# Patient Record
Sex: Female | Born: 1960
Health system: Southern US, Community
[De-identification: ages and names within clinical notes are randomized; demographics above are authoritative.]

## PROBLEM LIST (undated history)

## (undated) DIAGNOSIS — F32A Depression, unspecified: Secondary | ICD-10-CM

## (undated) DIAGNOSIS — M8580 Other specified disorders of bone density and structure, unspecified site: Secondary | ICD-10-CM

## (undated) DIAGNOSIS — F329 Major depressive disorder, single episode, unspecified: Secondary | ICD-10-CM

## (undated) DIAGNOSIS — T7840XA Allergy, unspecified, initial encounter: Secondary | ICD-10-CM

## (undated) DIAGNOSIS — H409 Unspecified glaucoma: Secondary | ICD-10-CM

## (undated) DIAGNOSIS — F419 Anxiety disorder, unspecified: Secondary | ICD-10-CM

## (undated) DIAGNOSIS — E785 Hyperlipidemia, unspecified: Secondary | ICD-10-CM

## (undated) DIAGNOSIS — D649 Anemia, unspecified: Secondary | ICD-10-CM

## (undated) HISTORY — DX: Allergy, unspecified, initial encounter: T78.40XA

## (undated) HISTORY — DX: Other specified disorders of bone density and structure, unspecified site: M85.80

## (undated) HISTORY — DX: Unspecified glaucoma: H40.9

## (undated) HISTORY — DX: Major depressive disorder, single episode, unspecified: F32.9

## (undated) HISTORY — DX: Anemia, unspecified: D64.9

## (undated) HISTORY — PX: INNER EAR SURGERY: SHX679

## (undated) HISTORY — DX: Gilbert syndrome: E80.4

## (undated) HISTORY — PX: WISDOM TOOTH EXTRACTION: SHX21

## (undated) HISTORY — DX: Depression, unspecified: F32.A

## (undated) HISTORY — DX: Anxiety disorder, unspecified: F41.9

## (undated) HISTORY — PX: EYE SURGERY: SHX253

## (undated) HISTORY — PX: CHOLECYSTECTOMY: SHX55

## (undated) HISTORY — DX: Hyperlipidemia, unspecified: E78.5

---

## 1998-12-30 ENCOUNTER — Other Ambulatory Visit: Admission: RE | Admit: 1998-12-30 | Discharge: 1998-12-30 | Payer: Self-pay | Admitting: Gynecology

## 1999-12-31 ENCOUNTER — Other Ambulatory Visit: Admission: RE | Admit: 1999-12-31 | Discharge: 1999-12-31 | Payer: Self-pay | Admitting: Gynecology

## 2000-12-28 ENCOUNTER — Other Ambulatory Visit: Admission: RE | Admit: 2000-12-28 | Discharge: 2000-12-28 | Payer: Self-pay | Admitting: Gynecology

## 2001-12-14 ENCOUNTER — Other Ambulatory Visit: Admission: RE | Admit: 2001-12-14 | Discharge: 2001-12-14 | Payer: Self-pay | Admitting: Gynecology

## 2002-12-18 ENCOUNTER — Other Ambulatory Visit: Admission: RE | Admit: 2002-12-18 | Discharge: 2002-12-18 | Payer: Self-pay | Admitting: Gynecology

## 2003-06-24 ENCOUNTER — Encounter: Admission: RE | Admit: 2003-06-24 | Discharge: 2003-06-24 | Payer: Self-pay | Admitting: Family Medicine

## 2003-06-24 ENCOUNTER — Encounter: Payer: Self-pay | Admitting: Family Medicine

## 2003-12-12 ENCOUNTER — Other Ambulatory Visit: Admission: RE | Admit: 2003-12-12 | Discharge: 2003-12-12 | Payer: Self-pay | Admitting: Gynecology

## 2004-01-15 ENCOUNTER — Emergency Department (HOSPITAL_COMMUNITY): Admission: EM | Admit: 2004-01-15 | Discharge: 2004-01-15 | Payer: Self-pay

## 2004-12-06 ENCOUNTER — Encounter: Admission: RE | Admit: 2004-12-06 | Discharge: 2004-12-06 | Payer: Self-pay | Admitting: Family Medicine

## 2004-12-31 ENCOUNTER — Other Ambulatory Visit: Admission: RE | Admit: 2004-12-31 | Discharge: 2004-12-31 | Payer: Self-pay | Admitting: Gynecology

## 2005-01-24 ENCOUNTER — Ambulatory Visit: Payer: Self-pay | Admitting: Internal Medicine

## 2005-02-11 ENCOUNTER — Ambulatory Visit (HOSPITAL_COMMUNITY): Admission: RE | Admit: 2005-02-11 | Discharge: 2005-02-11 | Payer: Self-pay | Admitting: Family Medicine

## 2006-01-10 ENCOUNTER — Other Ambulatory Visit: Admission: RE | Admit: 2006-01-10 | Discharge: 2006-01-10 | Payer: Self-pay | Admitting: Gynecology

## 2007-01-16 ENCOUNTER — Other Ambulatory Visit: Admission: RE | Admit: 2007-01-16 | Discharge: 2007-01-16 | Payer: Self-pay | Admitting: Gynecology

## 2007-03-20 ENCOUNTER — Encounter: Admission: RE | Admit: 2007-03-20 | Discharge: 2007-03-20 | Payer: Self-pay | Admitting: Otolaryngology

## 2008-01-22 ENCOUNTER — Other Ambulatory Visit: Admission: RE | Admit: 2008-01-22 | Discharge: 2008-01-22 | Payer: Self-pay | Admitting: Gynecology

## 2008-12-25 ENCOUNTER — Ambulatory Visit: Payer: Self-pay | Admitting: Internal Medicine

## 2009-01-08 ENCOUNTER — Ambulatory Visit: Payer: Self-pay | Admitting: Internal Medicine

## 2009-01-08 LAB — HM COLONOSCOPY

## 2009-02-10 ENCOUNTER — Telehealth: Payer: Self-pay | Admitting: Internal Medicine

## 2010-01-01 ENCOUNTER — Encounter: Payer: Self-pay | Admitting: Family Medicine

## 2010-01-01 LAB — CONVERTED CEMR LAB: Pap Smear: NORMAL

## 2010-06-30 ENCOUNTER — Encounter (INDEPENDENT_AMBULATORY_CARE_PROVIDER_SITE_OTHER): Payer: Self-pay | Admitting: *Deleted

## 2010-06-30 LAB — CONVERTED CEMR LAB
AST: 11 units/L
Alkaline Phosphatase: 45 units/L
Chloride, Serum: 105 mmol/L
Cholesterol: 214 mg/dL
HDL: 78 mg/dL
LDL Cholesterol: 121 mg/dL
Triglycerides: 77 mg/dL
Vit D, 25-Hydroxy: 48.1 ng/mL

## 2010-07-13 ENCOUNTER — Encounter: Payer: Self-pay | Admitting: Family Medicine

## 2010-11-08 ENCOUNTER — Ambulatory Visit (INDEPENDENT_AMBULATORY_CARE_PROVIDER_SITE_OTHER): Payer: 59 | Admitting: Family Medicine

## 2010-11-08 ENCOUNTER — Encounter: Payer: Self-pay | Admitting: Family Medicine

## 2010-11-08 ENCOUNTER — Encounter (INDEPENDENT_AMBULATORY_CARE_PROVIDER_SITE_OTHER): Payer: Self-pay | Admitting: *Deleted

## 2010-11-08 DIAGNOSIS — Z201 Contact with and (suspected) exposure to tuberculosis: Secondary | ICD-10-CM

## 2010-11-08 DIAGNOSIS — J309 Allergic rhinitis, unspecified: Secondary | ICD-10-CM

## 2010-11-08 DIAGNOSIS — J029 Acute pharyngitis, unspecified: Secondary | ICD-10-CM

## 2010-11-08 DIAGNOSIS — E785 Hyperlipidemia, unspecified: Secondary | ICD-10-CM | POA: Insufficient documentation

## 2010-11-08 DIAGNOSIS — M8580 Other specified disorders of bone density and structure, unspecified site: Secondary | ICD-10-CM | POA: Insufficient documentation

## 2010-11-18 NOTE — Assessment & Plan Note (Signed)
Summary: NEW PATIENT TO ESTABLISH/SPH   Vital Signs:  Patient profile:   50 year old female Height:      65 inches Weight:      154 pounds BMI:     25.72 Pulse rate:   82 / minute BP sitting:   108 / 70  (left arm)  Vitals Entered By: Doristine Devoid CMA (November 08, 2010 10:07 AM) CC: NEW EST-    History of Present Illness: 50 yo woman here today to establish care.  previous MD- [facility].  GYN- Dr Thamas Jaegers, UTD on paps and mammograms.  UTD on Colonoscopy Juanda Chance)  Seasonal Allergies- on Zyrtec.  sxs pretty well controlled.  has previously used nasal spray- has script for nasacort.  has been using nasal rinses w/ good results.  Hyperlipidemia- controlling w/ diet and exercise, not currently on meds.  due for recheck in April.  Total cholesterol 214, LDL 121 in 9/11.  hx of + PPD- 'years ago'.  had f/u CXR and tx w/ Rifampin for 6 months.  sore throat- sxs started > 1 week ago.  denies fevers, sick contacts.  feels it is due to PND.  denies increased nasal congestion, ear pain, cough.  Preventive Screening-Counseling & Management  Alcohol-Tobacco     Alcohol drinks/day: 0     Smoking Status: never  Caffeine-Diet-Exercise     Does Patient Exercise: yes     Type of exercise: goes to the Gym, elliptical at home     Times/week: <3      Sexual History:  currently monogamous.        Drug Use:  never.    Current Medications (verified): 1)  Zyrtec Allergy 10 Mg Tabs (Cetirizine Hcl) .... Take One Tablet Daily 2)  Loestrin 1/20 (21) 1-20 Mg-Mcg Tabs (Norethindrone Acet-Ethinyl Est) .... Take One Tablet Daily 3)  Vitamin D 2000 Unit Tabs (Cholecalciferol) .... Take One Tablet Daily 4)  Calcium 1200 1200-1000 Mg-Unit Chew (Calcium Carbonate-Vit D-Min) .... Take One Tablet Daily  Allergies (verified): 1)  ! Aspirin 2)  ! 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 Eyes:  Denies blurring and double vision. CV:  Denies chest pain or discomfort, palpitations, shortness of breath with exertion, swelling of feet, and swelling of hands. Resp:  Denies shortness of breath. GI:  Denies abdominal pain, nausea, and vomiting. Derm:  Denies lesion(s) and rash. Neuro:  Denies headaches. Psych:  Denies anxiety and depression.  Physical Exam  General:  Well-developed,well-nourished,in no acute distress; alert,appropriate and cooperative throughout examination Head:  Normocephalic and atraumatic without obvious abnormalities. No apparent alopecia or balding. Eyes:  no injxn or inflammation Ears:  hearing aid in place on R TM normal on L Nose:  mildly edematous turbinates Mouth:  + PND, tonsilar enlargement on R w/ shallow ulcer Neck:  No deformities, masses, or tenderness noted. Lungs:  Normal respiratory effort, chest expands symmetrically. Lungs are clear to auscultation, no crackles or wheezes. Heart:  Normal rate and regular rhythm. S1 and S2 normal  without gallop, murmur, click, rub or other extra sounds. Pulses:  +2 carotid, radial, DP Extremities:  no C/C/E Neurologic:  alert & oriented X3, cranial nerves II-XII intact, and gait normal.   Skin:  turgor normal and color normal.   Cervical Nodes:  No lymphadenopathy noted Psych:  Oriented X3, memory intact for recent and remote, normally interactive, good eye contact, not anxious appearing, and not depressed appearing.     Impression &  Recommendations:  Problem # 1:  ALLERGIC RHINITIS (ICD-477.9) Assessment New pt report sxs are currently well controlled.  continue Zyrtec.  uses Nasacort as needed.  will follow. Her updated medication list for this problem includes:    Zyrtec Allergy 10 Mg Tabs (Cetirizine hcl) .[name]... Take one tablet daily  Problem # 2:  HYPERLIPIDEMIA (ICD-272.4) Assessment: New according to labs from 9/11 she is due for repeat labs in April.  will have pt schedule fasting appt.  Problem # 3:  CONTACT WITH OR EXPOSURE TO TUBERCULOSIS (ICD-V01.1) Assessment: New hx of + PPD 'years ago'.  tx'd w/ Rifampin.  has never had sxs.  Problem # 4:  PHARYNGITIS-ACUTE (ICD-462) Assessment: New  pt w/ swollen R tonsil.  rapid strep negative.  most likely due to PND.  encouraged her to re-start Nasacort and use ibuprofen as needed for pain and swelling.  Orders: Rapid Strep (16109)  Complete Medication List: 1)  Zyrtec Allergy 10 Mg Tabs (Cetirizine hcl) .... Take one tablet daily 2)  Loestrin 1/20 (21) 1-20 Mg-mcg Tabs (Norethindrone acet-ethinyl est) .... Take one tablet daily 3)  Vitamin D 2000 Unit Tabs (Cholecalciferol) .... Take one tablet daily 4)  Calcium 1200 1200-1000 Mg-unit Chew (Calcium carbonate-vit d-min) .... Take one tablet daily  Patient Instructions: 1)  Please schedule a fasting lab visit in April 2)  Hepatic Panel prior to visit ICD-9: 272.4 3)  Lipid panel prior to visit ICD-9 : 272.4 4)  Plan on your complete physical in October 5)  Keep up the good work- you look great! 6)  Call with any questions or concerns 7)  Welcome!  We're glad to have you!   Orders Added: 1)  Rapid Strep [60454] 2)  New Patient Level III [09811]     Preventive Care Screening  Mammogram:    Date:  05/03/2010    Results:  normal   Pap Smear:    Date:  01/01/2010    Results:  normal    Appended Document: NEW PATIENT TO ESTABLISH/SPH    Clinical Lists Changes  Orders: Added new Service  order of Rapid Strep [phone]) - Signed Observations: Added new observation of RAPID STREP: negative (11/08/2010 10:50)      Laboratory Results    Other Tests  Rapid Strep: negative  Kit Test Internal QC: Positive   (Normal Range: Negative)

## 2010-11-18 NOTE — Miscellaneous (Signed)
  Clinical Lists Changes  Observations: Added new observation of VIT D 25-OH: 48.1 ng/mL (06/30/2010 15:29) Added new observation of TSH: 1.660 microintl units/mL (06/30/2010 15:29) Added new observation of TRIGLYC TOT: 77 mg/dL (04/54/0981 19:14) Added new observation of LDL: 121 mg/dL (78/29/5621 30:86) Added new observation of HDL: 78 mg/dL (57/84/6962 95:28) Added new observation of CHOLESTEROL: 214 mg/dL (41/32/4401 02:72) Added new observation of BILI TOTAL: 1.0 mg/dL (53/66/4403 47:42) Added new observation of ALK PHOS: 45 units/L (06/30/2010 15:29) Added new observation of SGPT (ALT): 7 units/L (06/30/2010 15:29) Added new observation of SGOT (AST): 11 units/L (06/30/2010 15:29) Added new observation of PROTEIN, TOT: 6.3 g/dL (59/56/3875 64:33) Added new observation of ALBUMIN: 3.6 g/dL (29/51/8841 66:06) Added new observation of CALCIUM: 8.8 mg/dL (30/16/0109 32:35) Added new observation of CO2 TOTAL: 17 mmol/L (06/30/2010 15:29) Added new observation of CHLORIDE: 105 mmol/L (06/30/2010 15:29) Added new observation of POTASSIUM: 4.1 mmol/L (06/30/2010 15:29) Added new observation of SODIUM: 140 mmol/L (06/30/2010 15:29) Added new observation of BONE DENSITY: osteopenia std dev (07/03/2008 15:32)      Preventive Care Screening  Bone Density:    Date:  07/03/2008    Results:  osteopenia

## 2010-11-24 NOTE — Letter (Signed)
Summary: Records from [facility] at [facility] 2011  Records from [facility] at [facility] 2011   Imported By: Maryln Gottron 11/15/2010 11:11:10  _____________________________________________________________________  External Attachment:    Type:   Image     Comment:   External Document

## 2011-01-24 ENCOUNTER — Other Ambulatory Visit (INDEPENDENT_AMBULATORY_CARE_PROVIDER_SITE_OTHER): Payer: 59

## 2011-01-24 ENCOUNTER — Encounter: Payer: Self-pay | Admitting: *Deleted

## 2011-01-24 DIAGNOSIS — E782 Mixed hyperlipidemia: Secondary | ICD-10-CM

## 2011-01-24 DIAGNOSIS — E785 Hyperlipidemia, unspecified: Secondary | ICD-10-CM

## 2011-01-24 LAB — HEPATIC FUNCTION PANEL
ALT: 10 U/L (ref 0–35)
Albumin: 3.3 g/dL — ABNORMAL LOW (ref 3.5–5.2)
Bilirubin, Direct: 0.1 mg/dL (ref 0.0–0.3)
Total Bilirubin: 1 mg/dL (ref 0.3–1.2)
Total Protein: 6.5 g/dL (ref 6.0–8.3)

## 2011-01-24 LAB — LIPID PANEL: Cholesterol: 205 mg/dL — ABNORMAL HIGH (ref 0–200)

## 2011-06-20 ENCOUNTER — Encounter: Payer: Self-pay | Admitting: Family Medicine

## 2011-07-12 ENCOUNTER — Encounter: Payer: Self-pay | Admitting: Family Medicine

## 2011-07-13 ENCOUNTER — Ambulatory Visit (INDEPENDENT_AMBULATORY_CARE_PROVIDER_SITE_OTHER): Payer: 59 | Admitting: Family Medicine

## 2011-07-13 ENCOUNTER — Encounter: Payer: Self-pay | Admitting: Family Medicine

## 2011-07-13 DIAGNOSIS — Z0000 Encounter for general adult medical examination without abnormal findings: Secondary | ICD-10-CM | POA: Insufficient documentation

## 2011-07-13 LAB — BASIC METABOLIC PANEL
BUN: 13 mg/dL (ref 6–23)
CO2: 29 mEq/L (ref 19–32)
Chloride: 109 mEq/L (ref 96–112)
Creatinine, Ser: 0.9 mg/dL (ref 0.4–1.2)
GFR: 83.06 mL/min (ref >60.00)

## 2011-07-13 LAB — HEPATIC FUNCTION PANEL
ALT: 14 U/L (ref 0–35)
AST: 19 U/L (ref 0–37)
Albumin: 4.2 g/dL (ref 3.5–5.2)
Alkaline Phosphatase: 60 U/L (ref 39–117)
Total Bilirubin: 1.2 mg/dL (ref 0.3–1.2)
Total Protein: 7.5 g/dL (ref 6.0–8.3)

## 2011-07-13 LAB — POCT URINALYSIS DIPSTICK
Bilirubin, UA: NEGATIVE
Ketones, UA: NEGATIVE
pH, UA: 5

## 2011-07-13 LAB — CBC WITH DIFFERENTIAL/PLATELET
Basophils Relative: 0.5 % (ref 0.0–3.0)
Eosinophils Absolute: 0.1 10*3/uL (ref 0.0–0.7)
HCT: 41.8 % (ref 36.0–46.0)
Hemoglobin: 13.9 g/dL (ref 12.0–15.0)
MCHC: 33.1 g/dL (ref 30.0–36.0)
Neutrophils Relative %: 39.4 % — ABNORMAL LOW (ref 43.0–77.0)
Platelets: 164 10*3/uL (ref 150.0–400.0)
RBC: 4.86 Mil/uL (ref 3.87–5.11)
WBC: 3.6 10*3/uL — ABNORMAL LOW (ref 4.5–10.5)

## 2011-07-13 LAB — TSH: TSH: 0.81 u[IU]/mL (ref 0.35–5.50)

## 2011-07-13 LAB — LIPID PANEL
Total CHOL/HDL Ratio: 2
VLDL: 7 mg/dL (ref 0.0–40.0)

## 2011-07-13 LAB — LDL CHOLESTEROL, DIRECT: Direct LDL: 140.6 mg/dL

## 2011-07-13 MED ORDER — CETIRIZINE HCL 10 MG PO CHEW: 10.0000 mg | CHEWABLE_TABLET | Freq: Every day | ORAL | Status: DC

## 2011-07-13 MED ORDER — EFLORNITHINE HCL 13.9 % EX CREA: 1.0000 "application " | TOPICAL_CREAM | Freq: Two times a day (BID) | CUTANEOUS | Status: AC

## 2011-07-13 NOTE — Assessment & Plan Note (Signed)
Pt's PE WNL.  UTD on health maintenance.  Check labs.  Anticipatory guidance provided.  

## 2011-07-13 NOTE — Patient Instructions (Signed)
We'll notify you of your lab results and determine when you need to follow up Keep up the good work on diet and exercise!  You look great! Call with any questions or concerns Have a great upcoming holiday season!!!

## 2011-07-13 NOTE — Progress Notes (Signed)
  Subjective:    Patient ID: Courtney Calhoun, female    DOB: 1961/04/29, 50 y.o.   MRN: [id number]  HPI CPE- GYN, [facility], UTD on pap and mammo.  UTD on colonoscopy.  No concerns today   Review of Systems Patient reports no vision/ hearing changes, adenopathy, fever, weight change,  persistant/recurrent hoarseness, swallowing issues, chest pain, palpitations, edema, persistant/recurrent cough, hemoptysis, dyspnea (rest/exertional/paroxysmal nocturnal), gastrointestinal bleeding (melena, rectal bleeding), abdominal pain, significant heartburn, bowel changes, GU symptoms (dysuria, hematuria, incontinence), Gyn symptoms (abnormal  bleeding, pain),  syncope, focal weakness, memory loss, numbness & tingling, skin/hair/nail changes, abnormal bruising or bleeding, anxiety, or depression.     Objective:   Physical Exam  General Appearance:    Alert, cooperative, no distress, appears stated age  Head:    Normocephalic, without obvious abnormality, atraumatic  Eyes:    PERRL, conjunctiva/corneas clear, EOM's intact, fundi    benign, both eyes  Ears:    Normal TM's and external ear canals, both ears  Nose:   Nares normal, septum midline, mucosa normal, no drainage    or sinus tenderness  Throat:   Lips, mucosa, and tongue normal; teeth and gums normal  Neck:   Supple, symmetrical, trachea midline, no adenopathy;    Thyroid: no enlargement/tenderness/nodules  Back:     Symmetric, no curvature, ROM normal, no CVA tenderness  Lungs:     Clear to auscultation bilaterally, respirations unlabored  Chest Wall:    No tenderness or deformity   Heart:    Regular rate and rhythm, S1 and S2 normal, no murmur, rub   or gallop  Breast Exam:    Deferred to GYN  Abdomen:     Soft, non-tender, bowel sounds active all four quadrants,    no masses, no organomegaly  Genitalia:    Deferred to GYN  Rectal:    Extremities:   Extremities normal, atraumatic, no cyanosis or edema  Pulses:   2+ and symmetric all  extremities  Skin:   Skin color, texture, turgor normal, no rashes or lesions  Lymph nodes:   Cervical, supraclavicular, and axillary nodes normal  Neurologic:   CNII-XII intact, normal strength, sensation and reflexes    throughout          Assessment & Plan:

## 2011-07-15 ENCOUNTER — Encounter: Payer: Self-pay | Admitting: *Deleted

## 2011-07-15 LAB — VITAMIN D 1,25 DIHYDROXY: Vitamin D2 1, 25 (OH)2: <8 pg/mL

## 2012-06-14 ENCOUNTER — Ambulatory Visit: Payer: 59 | Admitting: Family Medicine

## 2012-06-18 ENCOUNTER — Ambulatory Visit: Payer: 59 | Admitting: Family Medicine

## 2012-06-19 ENCOUNTER — Encounter: Payer: Self-pay | Admitting: Family Medicine

## 2012-07-23 ENCOUNTER — Telehealth: Payer: Self-pay | Admitting: Family Medicine

## 2012-07-23 ENCOUNTER — Telehealth: Payer: Self-pay

## 2012-07-23 MED ORDER — SCOPOLAMINE 1 MG/3DAYS TD PT72: 1.0000 | MEDICATED_PATCH | TRANSDERMAL | Status: DC

## 2012-07-23 NOTE — Telephone Encounter (Signed)
.  left message to have patient return my call.  

## 2012-07-23 NOTE — Telephone Encounter (Signed)
Pt would like you to call she would like a patch for motion sickness.

## 2012-07-23 NOTE — Telephone Encounter (Signed)
Please clarify orders for patch

## 2012-07-23 NOTE — Telephone Encounter (Signed)
Dup.

## 2012-07-23 NOTE — Telephone Encounter (Signed)
Spoke with pt. Pt verified pharmacy and advised me patches needed for traveling. Rx sent.   MW

## 2012-07-23 NOTE — Telephone Encounter (Signed)
Rx sent.    MW 

## 2012-07-23 NOTE — Telephone Encounter (Signed)
Please ask pt what she needs the patch for. If for upcoming travel can have scopolamine patch- apply 1 patch every 72 hrs, disp 1 box of 4

## 2012-07-27 ENCOUNTER — Telehealth: Payer: Self-pay | Admitting: *Deleted

## 2012-07-27 NOTE — Telephone Encounter (Signed)
Mailed recent results for Normal Bone mass from Bone Density to pt address in chart

## 2012-07-31 ENCOUNTER — Encounter: Payer: Self-pay | Admitting: Family Medicine

## 2012-08-23 ENCOUNTER — Encounter: Payer: 59 | Admitting: Family Medicine

## 2012-08-23 ENCOUNTER — Ambulatory Visit (INDEPENDENT_AMBULATORY_CARE_PROVIDER_SITE_OTHER): Payer: 59 | Admitting: Family Medicine

## 2012-08-23 ENCOUNTER — Encounter: Payer: Self-pay | Admitting: Family Medicine

## 2012-08-23 VITALS — BP 100/62 | HR 64 | Temp 98.0°F | Ht 66.0 in | Wt 154.0 lb

## 2012-08-23 DIAGNOSIS — Z0000 Encounter for general adult medical examination without abnormal findings: Secondary | ICD-10-CM

## 2012-08-23 LAB — LIPID PANEL
HDL: 102.5 mg/dL (ref >39.00)
Total CHOL/HDL Ratio: 3

## 2012-08-23 LAB — BASIC METABOLIC PANEL
CO2: 28 mEq/L (ref 19–32)
Calcium: 9.4 mg/dL (ref 8.4–10.5)
GFR: 93.12 mL/min (ref >60.00)
Sodium: 139 mEq/L (ref 135–145)

## 2012-08-23 LAB — CBC WITH DIFFERENTIAL/PLATELET
Basophils Absolute: 0 10*3/uL (ref 0.0–0.1)
Basophils Relative: 0.5 % (ref 0.0–3.0)
Eosinophils Absolute: 0.1 10*3/uL (ref 0.0–0.7)
Eosinophils Relative: 1.7 % (ref 0.0–5.0)
HCT: 40.8 % (ref 36.0–46.0)
Hemoglobin: 13.6 g/dL (ref 12.0–15.0)
Lymphs Abs: 1.7 10*3/uL (ref 0.7–4.0)
Neutro Abs: 1.7 10*3/uL (ref 1.4–7.7)
Neutrophils Relative %: 44.4 % (ref 43.0–77.0)
Platelets: 174 10*3/uL (ref 150.0–400.0)
WBC: 3.8 10*3/uL — ABNORMAL LOW (ref 4.5–10.5)

## 2012-08-23 LAB — LDL CHOLESTEROL, DIRECT: Direct LDL: 151 mg/dL

## 2012-08-23 LAB — HEPATIC FUNCTION PANEL
Alkaline Phosphatase: 79 U/L (ref 39–117)
Bilirubin, Direct: 0.2 mg/dL (ref 0.0–0.3)
Total Bilirubin: 1.5 mg/dL — ABNORMAL HIGH (ref 0.3–1.2)

## 2012-08-23 MED ORDER — EFLORNITHINE HCL 13.9 % EX CREA: 1.0000 "application " | TOPICAL_CREAM | Freq: Two times a day (BID) | CUTANEOUS | Status: DC

## 2012-08-23 NOTE — Assessment & Plan Note (Signed)
Pt's PE WNL.  UTD on health maintenance.  Check labs.  Anticipatory guidance provided.  

## 2012-08-23 NOTE — Patient Instructions (Addendum)
You look great! Follow up in 1 year or as needed We'll notify you of your lab results and make any changes if needed Call with any questions or concerns Happy Holidays!!!

## 2012-08-23 NOTE — Progress Notes (Signed)
  Subjective:    Patient ID: Courtney Calhoun, female    DOB: [birth year], 51 y.o.   MRN: [id number]  HPI CPE- UTD on pap, mammo, DEXA, colonoscopy.  No concerns today.   Review of Systems Patient reports no vision/ hearing changes, adenopathy,fever, weight change,  persistant/recurrent hoarseness , swallowing issues, chest pain, palpitations, edema, persistant/recurrent cough, hemoptysis, dyspnea (rest/exertional/paroxysmal nocturnal), gastrointestinal bleeding (melena, rectal bleeding), abdominal pain, significant heartburn, bowel changes, GU symptoms (dysuria, hematuria, incontinence), Gyn symptoms (abnormal  bleeding, pain),  syncope, focal weakness, memory loss, numbness & tingling, skin/hair/nail changes, abnormal bruising or bleeding, anxiety, or depression.     Objective:   Physical Exam General Appearance:    Alert, cooperative, no distress, appears stated age  Head:    Normocephalic, without obvious abnormality, atraumatic  Eyes:    PERRL, conjunctiva/corneas clear, EOM's intact, fundi    benign, both eyes  Ears:    Normal TM's and external ear canals, both ears  Nose:   Nares normal, septum midline, mucosa normal, no drainage    or sinus tenderness  Throat:   Lips, mucosa, and tongue normal; teeth and gums normal  Neck:   Supple, symmetrical, trachea midline, no adenopathy;    Thyroid: no enlargement/tenderness/nodules  Back:     Symmetric, no curvature, ROM normal, no CVA tenderness  Lungs:     Clear to auscultation bilaterally, respirations unlabored  Chest Wall:    No tenderness or deformity   Heart:    Regular rate and rhythm, S1 and S2 normal, no murmur, rub   or gallop  Breast Exam:    Deferred to GYN  Abdomen:     Soft, non-tender, bowel sounds active all four quadrants,    no masses, no organomegaly  Genitalia:    Deferred to GYN  Rectal:    Extremities:   Extremities normal, atraumatic, no cyanosis or edema  Pulses:   2+ and symmetric all extremities  Skin:   Skin  color, texture, turgor normal, no rashes or lesions  Lymph nodes:   Cervical, supraclavicular, and axillary nodes normal  Neurologic:   CNII-XII intact, normal strength, sensation and reflexes    throughout          Assessment & Plan:

## 2012-08-28 ENCOUNTER — Encounter: Payer: Self-pay | Admitting: Family Medicine

## 2012-08-28 ENCOUNTER — Telehealth: Payer: Self-pay | Admitting: *Deleted

## 2012-08-28 NOTE — Telephone Encounter (Signed)
Called pt to let her know the menu she received was likely a cardiac/diabetic diet menu. She mainly needed to follow the low cholesterol portion of it. She verbalized understanding. SGJ, RN

## 2012-08-29 LAB — VITAMIN D 1,25 DIHYDROXY
Vitamin D 1, 25 (OH)2 Total: 43 pg/mL (ref 18–72)
Vitamin D3 1, 25 (OH)2: 43 pg/mL

## 2012-11-17 ENCOUNTER — Other Ambulatory Visit: Payer: Self-pay

## 2013-01-04 ENCOUNTER — Telehealth: Payer: Self-pay | Admitting: Family Medicine

## 2013-01-04 ENCOUNTER — Telehealth (HOSPITAL_COMMUNITY): Payer: Self-pay | Admitting: *Deleted

## 2013-01-04 ENCOUNTER — Emergency Department (HOSPITAL_BASED_OUTPATIENT_CLINIC_OR_DEPARTMENT_OTHER)
Admission: EM | Admit: 2013-01-04 | Discharge: 2013-01-04 | Disposition: A | Discharge status: Home / Self Care | Payer: 59 | Attending: Emergency Medicine | Admitting: Emergency Medicine

## 2013-01-04 ENCOUNTER — Encounter (HOSPITAL_BASED_OUTPATIENT_CLINIC_OR_DEPARTMENT_OTHER): Payer: Self-pay | Admitting: Emergency Medicine

## 2013-01-04 DIAGNOSIS — Z8659 Personal history of other mental and behavioral disorders: Secondary | ICD-10-CM | POA: Insufficient documentation

## 2013-01-04 DIAGNOSIS — Z79899 Other long term (current) drug therapy: Secondary | ICD-10-CM | POA: Insufficient documentation

## 2013-01-04 DIAGNOSIS — Z8639 Personal history of other endocrine, nutritional and metabolic disease: Secondary | ICD-10-CM | POA: Insufficient documentation

## 2013-01-04 DIAGNOSIS — Z87448 Personal history of other diseases of urinary system: Secondary | ICD-10-CM | POA: Insufficient documentation

## 2013-01-04 DIAGNOSIS — M899 Disorder of bone, unspecified: Secondary | ICD-10-CM | POA: Insufficient documentation

## 2013-01-04 DIAGNOSIS — Z862 Personal history of diseases of the blood and blood-forming organs and certain disorders involving the immune mechanism: Secondary | ICD-10-CM | POA: Insufficient documentation

## 2013-01-04 DIAGNOSIS — R002 Palpitations: Secondary | ICD-10-CM | POA: Insufficient documentation

## 2013-01-04 LAB — CBC WITH DIFFERENTIAL/PLATELET
Basophils Absolute: 0 10*3/uL (ref 0.0–0.1)
HCT: 36.8 % (ref 36.0–46.0)
Hemoglobin: 13.5 g/dL (ref 12.0–15.0)
Lymphocytes Relative: 53 % — ABNORMAL HIGH (ref 12–46)
Lymphs Abs: 2.2 10*3/uL (ref 0.7–4.0)
Monocytes Absolute: 0.4 10*3/uL (ref 0.1–1.0)
Monocytes Relative: 9 % (ref 3–12)
Neutro Abs: 1.5 10*3/uL — ABNORMAL LOW (ref 1.7–7.7)
RBC: 4.8 MIL/uL (ref 3.87–5.11)
RDW: 14.7 % (ref 11.5–15.5)
WBC: 4.1 10*3/uL (ref 4.0–10.5)

## 2013-01-04 LAB — BASIC METABOLIC PANEL
CO2: 28 mEq/L (ref 19–32)
Chloride: 106 mEq/L (ref 96–112)
Creatinine, Ser: 0.9 mg/dL (ref 0.50–1.10)

## 2013-01-04 NOTE — Telephone Encounter (Signed)
Noted  

## 2013-01-04 NOTE — ED Provider Notes (Signed)
History     CSN: [id number]  Arrival date & time 01/04/13  1522   First MD Initiated Contact with Patient 01/04/13 1533      Chief Complaint  Patient presents with  . Palpitations    (Consider location/radiation/quality/duration/timing/severity/associated sxs/prior treatment) Patient is a 52 y.o. female presenting with palpitations.  Palpitations    Pt with no history of heart disease reports intermittent palpitations for the last several days, initially while on the treadmill at the gym, HR was in the 200s, then last night while at rest. Episodes are self-limited, no associated CP, SOB. No fever. She does admit to significant caffeine use, but denies smoking or other stimulant use. She is asymptomatic now. Advised by PCP to come to the ED for evaluation, scheduled for follow up there next week.   Past Medical History  Diagnosis Date  . Anxiety   . Depression   . Osteopenia   . Gilbert's syndrome   . Hematuria     Past Surgical History  Procedure Laterality Date  . Cholecystectomy    . Inner ear surgery    . Eye surgery      Family History  Problem Relation Age of Onset  . Hypertension Mother   . Cancer Maternal Aunt     colon  . Stroke Maternal Grandmother     History  Substance Use Topics  . Smoking status: Never Smoker   . Smokeless tobacco: Not on file  . Alcohol Use: No    OB History   Grav Para Term Preterm Abortions TAB SAB Ect Mult Living                  Review of Systems  Cardiovascular: Positive for palpitations.   All other systems reviewed and are negative except as noted in HPI.   Allergies  Aspirin and Ibuprofen  Home Medications   Current Outpatient Rx  Name  Route  Sig  Dispense  Refill  . benzoyl peroxide (PANOXYL-4 CREAMY WASH) 4 % external liquid   Topical   Apply topically daily.         . Calcium Carbonate-Vit D-Min (CALCIUM 1200) 1200-1000 MG-UNIT CHEW   Oral   Chew by mouth daily.           . cetirizine (ZYRTEC)  10 MG chewable tablet   Oral   Chew 1 tablet (10 mg total) by mouth daily.   90 tablet   3   . Cholecalciferol (VITAMIN D) 2000 UNITS tablet   Oral   Take 2,000 Units by mouth daily.           [name] doxycycline (DORYX) 100 MG EC tablet   Oral   Take 100 mg by mouth 2 (two) times daily.         . Eflornithine HCl (VANIQA) 13.9 % cream   Topical   Apply 1 application topically 2 (two) times daily.   45 g   3   . latanoprost (XALATAN) 0.005 % ophthalmic solution      1 drop at bedtime.         . norethindrone-ethinyl estradiol (MICROGESTIN,JUNEL,LOESTRIN) 1-20 MG-MCG tablet   Oral   Take 1 tablet by mouth daily.           [name] scopolamine (TRANSDERM-SCOP) 1.5 MG   Transdermal   Place 1 patch (1.5 mg total) onto the skin every 3 (three) days.   4 patch   0   . tretinoin (RETIN-A) 0.05 % cream   Topical  Apply topically at bedtime.           BP 128/71  Pulse 69  Temp(Src) 97.9 F (36.6 C) (Oral)  Resp 16  Ht 5\' 6"  (1.676 m)  Wt 157 lb (71.215 kg)  BMI 25.35 kg/m2  SpO2 100%  Physical Exam  Nursing note and vitals reviewed. Constitutional: She is oriented to person, place, and time. She appears well-developed and well-nourished.  HENT:  Head: Normocephalic and atraumatic.  Eyes: EOM are normal. Pupils are equal, round, and reactive to light.  Neck: Normal range of motion. Neck supple.  Cardiovascular: Normal rate, normal heart sounds and intact distal pulses.   Pulmonary/Chest: Effort normal and breath sounds normal.  Abdominal: Bowel sounds are normal. She exhibits no distension. There is no tenderness.  Musculoskeletal: Normal range of motion. She exhibits no edema and no tenderness.  Neurological: She is alert and oriented to person, place, and time. She has normal strength. No cranial nerve deficit or sensory deficit.  Skin: Skin is warm and dry. No rash noted.  Psychiatric: She has a normal mood and affect.    ED Course  Procedures (including  critical care time)  Labs Reviewed  CBC WITH DIFFERENTIAL - Abnormal; Notable for the following:    MCV 76.7 (*)    MCHC 36.7 (*)    Neutrophils Relative 37 (*)    Neutro Abs 1.5 (*)    Lymphocytes Relative 53 (*)    All other components within normal limits  BASIC METABOLIC PANEL - Abnormal; Notable for the following:    Glucose, Bld 120 (*)    GFR calc non Af Amer 73 (*)    GFR calc Af Amer 84 (*)    All other components within normal limits   No results found.   1. Palpitations       MDM  No CAD or PE risk factors. PERC neg. HR normal now and asymptomatic. Will check for anemia or elyte abnormality which may contribute to palpitations. Suspect SVT, less likely afib or Vtach   Date: 01/04/2013  Rate: 66  Rhythm: normal sinus rhythm  QRS Axis: normal  Intervals: normal  ST/T Wave abnormalities: normal  Conduction Disutrbances: none  Narrative Interpretation: unremarkable  4:31 PM Labs unremarkable. No palpitations in the ED. PCP followup.           Charles B. Bernette Mayers, MD 01/04/13 [phone]

## 2013-01-04 NOTE — Telephone Encounter (Signed)
As fyi...Marland KitchenMarland KitchenPatient called asking to schedule an appointment with Dr. Beverely Low.  When asked the reason, patient stated her heart has been racing lately.  I asked her if this was a new symptom, she stated yes, but no chest pain associated.  I told patient we would like to see her today for this, but she stated "I'm going out of town, schedule me for April 10th or 11th".   She is scheduled for 01/10/13 to see Dr. Beverely Low.  Please triage.

## 2013-01-04 NOTE — Telephone Encounter (Signed)
Pt should really be seen sooner than that if possible.  If not here for an OV, than at UC to make sure that there's nothing serious going on

## 2013-01-04 NOTE — ED Notes (Signed)
Palpitations yesterday while watching TV.  States she had 2 episodes of palpitations last week while exercising at the gym.  No CP or SHOB.

## 2013-01-04 NOTE — Telephone Encounter (Signed)
Spoke with pt she states she can feel her heart racing and hear it in her ears. 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 Advised the patient that excessive caffeine intake could be contributing to her increased heart rate but that she really needed to be checked out before next week. Pt agreed to go to [facility] for evaluation now.

## 2013-01-10 ENCOUNTER — Ambulatory Visit (INDEPENDENT_AMBULATORY_CARE_PROVIDER_SITE_OTHER): Payer: 59 | Admitting: Family Medicine

## 2013-01-10 ENCOUNTER — Encounter: Payer: Self-pay | Admitting: Family Medicine

## 2013-01-10 VITALS — BP 110/60 | HR 67 | Temp 98.3°F | Ht 65.25 in | Wt 161.2 lb

## 2013-01-10 DIAGNOSIS — R000 Tachycardia, unspecified: Secondary | ICD-10-CM

## 2013-01-10 NOTE — Progress Notes (Signed)
  Subjective:    Patient ID: Courtney Calhoun, female    DOB: 07/07/1961, 52 y.o.   MRN: [id number]  HPI Tachycardia- pt reports multiple episodes of elevated HR (max HR 209, other readings 160-180s) while walking on the treadmill.  Had no associated SOB, CP, no difficulty having conversation.  Had 1 episode of palpitations at night while lying down.  While on treadmill doesn't feel heart racing.  Had normal EKG and CBC in ER on 4/5.  Reports feeling well.  No increased fatigue.     Review of Systems For ROS see HPI     Objective:   Physical Exam  Vitals reviewed. Constitutional: She is oriented to person, place, and time. She appears well-developed and well-nourished. No distress.  HENT:  Head: Normocephalic and atraumatic.  Eyes: Conjunctivae and EOM are normal. Pupils are equal, round, and reactive to light.  Neck: Normal range of motion. Neck supple. No thyromegaly present.  Cardiovascular: Normal rate, regular rhythm, normal heart sounds and intact distal pulses.   No murmur heard. Pulmonary/Chest: Effort normal and breath sounds normal. No respiratory distress. She has no wheezes.  Musculoskeletal: She exhibits no edema.  Lymphadenopathy:    She has no cervical adenopathy.  Neurological: She is alert and oriented to person, place, and time.  Skin: Skin is warm and dry.  Psychiatric: She has a normal mood and affect. Her behavior is normal.          Assessment & Plan:

## 2013-01-10 NOTE — Patient Instructions (Addendum)
We'll call you with your holter monitor instructions Once we have the results, we'll review and let you know It's ok to exercise Call if symptoms change or worsen Happy Spring!!

## 2013-01-10 NOTE — Assessment & Plan Note (Signed)
New.  HR normal here in office.  Normal EKG in ER.  Discussed that some elevation of HR during exercise is to be expected but HR of 200+ is excessive.  Pt to limit caffeine.  Had normal BMP and CBC in ER.  Normal TSH at last PE.  Will get holter monitor to assess for possible arrhythmia.  Pt expressed understanding and is in agreement w/ plan.

## 2013-01-14 ENCOUNTER — Ambulatory Visit (INDEPENDENT_AMBULATORY_CARE_PROVIDER_SITE_OTHER): Payer: 59

## 2013-01-14 DIAGNOSIS — R000 Tachycardia, unspecified: Secondary | ICD-10-CM

## 2013-01-14 DIAGNOSIS — R002 Palpitations: Secondary | ICD-10-CM

## 2013-01-14 NOTE — Progress Notes (Signed)
Placed a 48 hr monitor on patient and went over instructions on how to use the it and when to return it

## 2013-01-28 ENCOUNTER — Telehealth: Payer: Self-pay | Admitting: *Deleted

## 2013-01-28 NOTE — Telephone Encounter (Signed)
Lm @ (9:06am) asking the pt to RTC.//AB/CMA

## 2013-01-28 NOTE — Telephone Encounter (Signed)
Spoke with the pt and informed her that she had a normal holter monitor x 48 hr.  Pt understood.//AB/CMA

## 2013-02-21 ENCOUNTER — Encounter: Payer: Self-pay | Admitting: Family Medicine

## 2013-06-10 LAB — HM MAMMOGRAPHY: HM Mammogram: NORMAL

## 2013-06-25 ENCOUNTER — Encounter: Payer: Self-pay | Admitting: *Deleted

## 2013-08-08 ENCOUNTER — Other Ambulatory Visit: Payer: Self-pay

## 2013-08-23 ENCOUNTER — Telehealth: Payer: Self-pay

## 2013-08-23 NOTE — Telephone Encounter (Addendum)
Left message for call back Non identifiable Medication and allergies: reviewed and updated  90 day supply/mail order: na Local pharmacy: [facility] [address]   Immunizations due:  UTD  A/P:   No changes to FH or PSH or personal hx Paps with gyn Health Maintenance  Topic Date Due  . Pap Smear  01/01/2013  . Influenza Vaccine  05/03/2014  . Mammogram  06/11/2015  . Colonoscopy  01/09/2019  . Tetanus/tdap  05/03/2022   To Discuss with Provider: Not at this time

## 2013-08-26 ENCOUNTER — Ambulatory Visit (INDEPENDENT_AMBULATORY_CARE_PROVIDER_SITE_OTHER): Payer: 59 | Admitting: Family Medicine

## 2013-08-26 ENCOUNTER — Encounter: Payer: Self-pay | Admitting: Family Medicine

## 2013-08-26 VITALS — BP 110/74 | HR 65 | Temp 98.6°F | Resp 16 | Wt 160.5 lb

## 2013-08-26 DIAGNOSIS — Z0000 Encounter for general adult medical examination without abnormal findings: Secondary | ICD-10-CM

## 2013-08-26 LAB — CBC WITH DIFFERENTIAL/PLATELET
Basophils Relative: 0.4 % (ref 0.0–3.0)
Eosinophils Absolute: 0 10*3/uL (ref 0.0–0.7)
Hemoglobin: 13.7 g/dL (ref 12.0–15.0)
Lymphocytes Relative: 39.5 % (ref 12.0–46.0)
MCHC: 33.2 g/dL (ref 30.0–36.0)
Monocytes Relative: 8.1 % (ref 3.0–12.0)
Neutro Abs: 1.9 10*3/uL (ref 1.4–7.7)
RBC: 4.93 Mil/uL (ref 3.87–5.11)

## 2013-08-26 LAB — HEPATIC FUNCTION PANEL
ALT: 11 U/L (ref 0–35)
Albumin: 4 g/dL (ref 3.5–5.2)
Total Protein: 7.4 g/dL (ref 6.0–8.3)

## 2013-08-26 LAB — BASIC METABOLIC PANEL
BUN: 11 mg/dL (ref 6–23)
CO2: 28 mEq/L (ref 19–32)
Chloride: 106 mEq/L (ref 96–112)
Creatinine, Ser: 0.9 mg/dL (ref 0.4–1.2)
Glucose, Bld: 78 mg/dL (ref 70–99)

## 2013-08-26 LAB — TSH: TSH: 0.86 u[IU]/mL (ref 0.35–5.50)

## 2013-08-26 LAB — LDL CHOLESTEROL, DIRECT: Direct LDL: 147.2 mg/dL

## 2013-08-26 LAB — LIPID PANEL: Cholesterol: 264 mg/dL — ABNORMAL HIGH (ref 0–200)

## 2013-08-26 NOTE — Assessment & Plan Note (Signed)
Pt's PE WNL.  UTD on health maintenance.  Check labs.  Anticipatory guidance provided.  

## 2013-08-26 NOTE — Progress Notes (Signed)
  Subjective:    Patient ID: Courtney Calhoun, female    DOB: [birth year], 52 y.o.   MRN: [id number]  HPI Pre visit review using our clinic review tool, if applicable. No additional management support is needed unless otherwise documented below in the visit note.  CPE- UTD on colonoscopy, DEXA, mammo, pap.  GYN- [facility]   Review of Systems Patient reports no vision/ hearing changes, adenopathy,fever, weight change,  persistant/recurrent hoarseness , swallowing issues, chest pain, palpitations, edema, persistant/recurrent cough, hemoptysis, dyspnea (rest/exertional/paroxysmal nocturnal), gastrointestinal bleeding (melena, rectal bleeding), abdominal pain, significant heartburn, bowel changes, GU symptoms (dysuria, hematuria, incontinence), Gyn symptoms (abnormal  bleeding, pain),  syncope, focal weakness, memory loss, numbness & tingling, skin/hair/nail changes, abnormal bruising or bleeding, anxiety, or depression.     Objective:   Physical Exam General Appearance:    Alert, cooperative, no distress, appears stated age  Head:    Normocephalic, without obvious abnormality, atraumatic  Eyes:    PERRL, conjunctiva/corneas clear, EOM's intact, fundi    benign, both eyes  Ears:    Normal TM's and external ear canals, both ears  Nose:   Nares normal, septum midline, mucosa normal, no drainage    or sinus tenderness  Throat:   Lips, mucosa, and tongue normal; teeth and gums normal  Neck:   Supple, symmetrical, trachea midline, no adenopathy;    Thyroid: no enlargement/tenderness/nodules  Back:     Symmetric, no curvature, ROM normal, no CVA tenderness  Lungs:     Clear to auscultation bilaterally, respirations unlabored  Chest Wall:    No tenderness or deformity   Heart:    Regular rate and rhythm, S1 and S2 normal, no murmur, rub   or gallop  Breast Exam:    Deferred to GYN  Abdomen:     Soft, non-tender, bowel sounds active all four quadrants,    no masses, no organomegaly   Genitalia:    Deferred to GYN  Rectal:    Extremities:   Extremities normal, atraumatic, no cyanosis or edema  Pulses:   2+ and symmetric all extremities  Skin:   Skin color, texture, turgor normal, no rashes or lesions  Lymph nodes:   Cervical, supraclavicular, and axillary nodes normal  Neurologic:   CNII-XII intact, normal strength, sensation and reflexes    throughout          Assessment & Plan:

## 2013-08-26 NOTE — Patient Instructions (Signed)
Follow up in 6 months to recheck cholesterol We'll notify you of your lab results and make any changes if needed Keep up the good work!  You look great! Call with any questions or concerns Happy Holidays!!! 

## 2013-08-28 LAB — VITAMIN D 1,25 DIHYDROXY: Vitamin D2 1, 25 (OH)2: <8 pg/mL

## 2013-08-30 ENCOUNTER — Encounter: Payer: Self-pay | Admitting: Family Medicine

## 2014-01-31 LAB — HM PAP SMEAR: HM Pap smear: NORMAL

## 2014-02-20 ENCOUNTER — Ambulatory Visit: Payer: 59 | Admitting: Family Medicine

## 2014-02-27 ENCOUNTER — Ambulatory Visit (INDEPENDENT_AMBULATORY_CARE_PROVIDER_SITE_OTHER): Payer: 59 | Admitting: Family Medicine

## 2014-02-27 ENCOUNTER — Encounter: Payer: Self-pay | Admitting: Family Medicine

## 2014-02-27 VITALS — BP 110/72 | HR 60 | Temp 98.0°F | Resp 98 | Wt 159.2 lb

## 2014-02-27 DIAGNOSIS — E785 Hyperlipidemia, unspecified: Secondary | ICD-10-CM

## 2014-02-27 NOTE — Progress Notes (Signed)
Pre visit review using our clinic review tool, if applicable. No additional management support is needed unless otherwise documented below in the visit note. 

## 2014-02-27 NOTE — Patient Instructions (Signed)
Schedule your complete physical for 6 months We'll notify you of your lab results and make any changes if needed Keep up the good work!  You look great! Call with any questions or concerns Have a great summer!!!

## 2014-02-27 NOTE — Progress Notes (Signed)
   Subjective:    Patient ID: Courtney Calhoun, female    DOB: 02-Mar-1961, 53 y.o.   MRN: [id number]  HPI Hyperlipidemia- in November, total cholesterol was 264 and LDL was 147.  HDL was great at 103.  We opted for no meds at that time and were going to work on diet and exercise.  Pt is going to zumba/dance class twice weekly.  No CP, SOB, HAs, visual changes, abd pain, N/V.   Review of Systems For ROS see HPI     Objective:   Physical Exam  Vitals reviewed. Constitutional: She is oriented to person, place, and time. She appears well-developed and well-nourished. No distress.  HENT:  Head: Normocephalic and atraumatic.  Eyes: Conjunctivae and EOM are normal. Pupils are equal, round, and reactive to light.  Neck: Normal range of motion. Neck supple. No thyromegaly present.  Cardiovascular: Normal rate, regular rhythm, normal heart sounds and intact distal pulses.   No murmur heard. Pulmonary/Chest: Effort normal and breath sounds normal. No respiratory distress.  Abdominal: Soft. She exhibits no distension. There is no tenderness.  Musculoskeletal: She exhibits no edema.  Lymphadenopathy:    She has no cervical adenopathy.  Neurological: She is alert and oriented to person, place, and time.  Skin: Skin is warm and dry.  Psychiatric: She has a normal mood and affect. Her behavior is normal.          Assessment & Plan:

## 2014-02-27 NOTE — Assessment & Plan Note (Signed)
Chronic problem.  Not currently on meds.  Attempting to control w/ healthy diet and regular exercise.  Had a discussion about the role of genetics.  Suggested OTC Red Yeast Rice.  Check labs.  Start meds prn.  Pt expressed understanding and is in agreement w/ plan.

## 2014-02-28 ENCOUNTER — Other Ambulatory Visit: Payer: Self-pay | Admitting: General Practice

## 2014-02-28 ENCOUNTER — Other Ambulatory Visit: Payer: Self-pay | Admitting: Family Medicine

## 2014-02-28 ENCOUNTER — Encounter: Payer: Self-pay | Admitting: Family Medicine

## 2014-02-28 DIAGNOSIS — E785 Hyperlipidemia, unspecified: Secondary | ICD-10-CM

## 2014-02-28 LAB — HEPATIC FUNCTION PANEL
ALT: 17 U/L (ref 0–35)
AST: 18 U/L (ref 0–37)
Albumin: 4 g/dL (ref 3.5–5.2)
Alkaline Phosphatase: 59 U/L (ref 39–117)
BILIRUBIN DIRECT: 0.1 mg/dL (ref 0.0–0.3)
TOTAL PROTEIN: 7.2 g/dL (ref 6.0–8.3)
Total Bilirubin: 1.3 mg/dL — ABNORMAL HIGH (ref 0.2–1.2)

## 2014-02-28 LAB — BASIC METABOLIC PANEL
BUN: 13 mg/dL (ref 6–23)
CHLORIDE: 108 meq/L (ref 96–112)
CO2: 30 meq/L (ref 19–32)
Calcium: 9.4 mg/dL (ref 8.4–10.5)
Creatinine, Ser: 0.9 mg/dL (ref 0.4–1.2)
GFR: 87.68 mL/min (ref >60.00)
Glucose, Bld: 69 mg/dL — ABNORMAL LOW (ref 70–99)
POTASSIUM: 4.1 meq/L (ref 3.5–5.1)
Sodium: 142 mEq/L (ref 135–145)

## 2014-02-28 LAB — LIPID PANEL
CHOL/HDL RATIO: 3
CHOLESTEROL: 287 mg/dL — AB (ref 0–200)
HDL: 105.1 mg/dL (ref >39.00)
LDL CALC: 173 mg/dL — AB (ref 0–99)
Triglycerides: 43 mg/dL (ref 0.0–149.0)
VLDL: 8.6 mg/dL (ref 0.0–40.0)

## 2014-02-28 MED ORDER — SIMVASTATIN 20 MG PO TABS: 20.0000 mg | ORAL_TABLET | Freq: Every day | ORAL | Status: DC

## 2014-03-03 NOTE — Telephone Encounter (Signed)
Referral placed.

## 2014-04-24 ENCOUNTER — Encounter: Payer: Self-pay | Admitting: Dietician

## 2014-04-24 ENCOUNTER — Encounter: Payer: 59 | Attending: Family Medicine | Admitting: Dietician

## 2014-04-24 VITALS — Ht 66.0 in | Wt 159.1 lb

## 2014-04-24 DIAGNOSIS — E785 Hyperlipidemia, unspecified: Secondary | ICD-10-CM

## 2014-04-24 DIAGNOSIS — Z713 Dietary counseling and surveillance: Secondary | ICD-10-CM | POA: Diagnosis not present

## 2014-04-24 NOTE — Patient Instructions (Addendum)
Aim to have some vegetables with your lunch and dinner. Think about buying pre-washed salad greens or carrots and ranch to add to lunch/dinner Choose baked, broiled, or grilled proteins instead of fried. Have salad dressing on the side. Continue having a goal of eating out no more than 1-2 x week. (Save the fried foods for those times). If you want to do smoothies, have about 1 cup of fruit, some protein (yogurt, milk, or nuts), and maybe add a green. Limit sugar in beverages. Try to drink mostly water or flavored water.  [place] Protein bars for a snack. (or Protein and carbohydrates from snack list.) Continue exercising 6 x week.

## 2014-04-24 NOTE — Progress Notes (Signed)
Medical Nutrition Therapy:  Appt start time: 1430 end time:  5170.   Assessment:  Primary concerns today: Courtney Calhoun is here today since her cholesterol is high and her doctor wanted her to start taking Zocor. Doctor agreed to letting her make lifestyle changes first for 6 months before going on the medication. Total cholesterol is 243 and LDL is 173.   Currently exercising more since talking to her doctor 4 x week (elliptical for 30 minutes) 2 x week Zumba class (60 minutes). Before doctor's appointment just doing Zumba 2 x week. Working on eating out less (was eating out every day) and avoiding fried foods. Currently eating out 1-2 x week.  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 Lives by herself and does her food shopping and meal preparation at home. Skips about 1 meal per week if sleeps in on the weekend.   Has started logging food on My Fitness Pal. Started to cook more so she has leftovers. Has been eating less overall. Doesn't like a lot of vegetables, but does like collards, carrots, lettuce, green peppers and onions, raw spinach, tomatoes (sometimes), zucchini at [place].   Preferred Learning Style:   No preference indicated   Learning Readiness:   Ready  MEDICATIONS: see list   DIETARY INTAKE:  Avoided foods include: a lot of vegetables, tries not to eat too much bread, fish if not fried    24-hr recall:  B ( AM): [place] sausage patty bacon with scrambled egg or toast with honey or carnation instant breakfast with 2% milk  Snk ( AM): [employer] biscuit or vending machine (cheese popcorn or chips)  L ( PM): [employer] (bacon double cheeseburger and soda) or hot dogs and fries or grilled chicken with corn Snk ( PM): none D ( PM):chicken wings baked and 1/2 cup macaroni and cheese, crock pot pulled pork and BBQ or spaghetti Snk ( PM): chips and salsa  Beverages: coffee with sugar and splenda, water, flavored water, sweet tea, orange juice, soda   Usual physical  activity: 4 x week (elliptical for 30 minutes) 2 x week Zumba class (60 minutes).   Estimated energy needs: 1600 calories 180 g carbohydrates 120 g protein 44 g fat  Progress Towards Goal(s):  In progress.   Nutritional Diagnosis:  [place]-2.2 Altered nutrition-related laboratory As related to excess consumption of high fat and high sugar foods/drinks.  As evidenced by elevated total and LDL cholesterol.    Intervention:  Nutrition counseling provided. Plan: Aim to have some vegetables with your lunch and dinner. Think about buying pre-washed salad greens or carrots and ranch to add to lunch/dinner Choose baked, broiled, or grilled proteins instead of fried. Have salad dressing on the side. Continue having a goal of eating out no more than 1-2 x week. (Save the fried foods for those times). If you want to do smoothies, have about 1 cup of fruit, some protein (yogurt, milk, or nuts), and maybe add a green. Limit sugar in beverages. Try to drink mostly water or flavored water.  [place] Protein bars for a snack. (or Protein and carbohydrates from snack list.) Continue exercising 6 x week.   Teaching Method Utilized:  Visual Auditory Hands on  Handouts given during visit include:  Nutrition Fact (food label)  MyPlate Handout  15 g CHO Snacks  Barriers to learning/adherence to lifestyle change: really likes fried foods   Demonstrated degree of understanding via:  Teach Back   Monitoring/Evaluation:  Dietary  intake, exercise, and body weight prn.

## 2014-05-27 ENCOUNTER — Encounter: Payer: Self-pay | Admitting: Internal Medicine

## 2014-06-12 LAB — HM MAMMOGRAPHY

## 2014-06-17 ENCOUNTER — Encounter: Payer: Self-pay | Admitting: General Practice

## 2014-08-04 LAB — HM DEXA SCAN: HM DEXA SCAN: NORMAL

## 2014-09-01 ENCOUNTER — Encounter: Payer: 59 | Admitting: Family Medicine

## 2014-09-09 ENCOUNTER — Encounter: Payer: Self-pay | Admitting: General Practice

## 2014-09-22 ENCOUNTER — Encounter: Payer: Self-pay | Admitting: Family Medicine

## 2014-09-22 ENCOUNTER — Ambulatory Visit (INDEPENDENT_AMBULATORY_CARE_PROVIDER_SITE_OTHER): Payer: 59 | Admitting: Family Medicine

## 2014-09-22 VITALS — BP 110/70 | HR 63 | Temp 98.4°F | Resp 16 | Ht 66.0 in | Wt 156.0 lb

## 2014-09-22 DIAGNOSIS — Z0000 Encounter for general adult medical examination without abnormal findings: Secondary | ICD-10-CM

## 2014-09-22 LAB — HEPATIC FUNCTION PANEL
ALBUMIN: 4 g/dL (ref 3.5–5.2)
ALT: 11 U/L (ref 0–35)
AST: 15 U/L (ref 0–37)
Alkaline Phosphatase: 60 U/L (ref 39–117)
Bilirubin, Direct: 0.1 mg/dL (ref 0.0–0.3)
Total Bilirubin: 1.7 mg/dL — ABNORMAL HIGH (ref 0.2–1.2)
Total Protein: 7.2 g/dL (ref 6.0–8.3)

## 2014-09-22 LAB — BASIC METABOLIC PANEL
BUN: 13 mg/dL (ref 6–23)
CHLORIDE: 107 meq/L (ref 96–112)
CO2: 25 mEq/L (ref 19–32)
Calcium: 9.3 mg/dL (ref 8.4–10.5)
Creatinine, Ser: 0.8 mg/dL (ref 0.4–1.2)
GFR: 93.67 mL/min (ref >60.00)
GLUCOSE: 81 mg/dL (ref 70–99)
POTASSIUM: 3.8 meq/L (ref 3.5–5.1)
Sodium: 139 mEq/L (ref 135–145)

## 2014-09-22 LAB — CBC WITH DIFFERENTIAL/PLATELET
BASOS ABS: 0 10*3/uL (ref 0.0–0.1)
Basophils Relative: 0.9 % (ref 0.0–3.0)
Eosinophils Absolute: 0.1 10*3/uL (ref 0.0–0.7)
Eosinophils Relative: 1.8 % (ref 0.0–5.0)
HCT: 39.8 % (ref 36.0–46.0)
Hemoglobin: 13.1 g/dL (ref 12.0–15.0)
LYMPHS PCT: 49.4 % — AB (ref 12.0–46.0)
Lymphs Abs: 1.7 10*3/uL (ref 0.7–4.0)
MCHC: 33 g/dL (ref 30.0–36.0)
MCV: 83.9 fl (ref 78.0–100.0)
MONOS PCT: 8.6 % (ref 3.0–12.0)
Monocytes Absolute: 0.3 10*3/uL (ref 0.1–1.0)
NEUTROS PCT: 39.3 % — AB (ref 43.0–77.0)
Neutro Abs: 1.3 10*3/uL — ABNORMAL LOW (ref 1.4–7.7)
PLATELETS: 165 10*3/uL (ref 150.0–400.0)
RBC: 4.74 Mil/uL (ref 3.87–5.11)
RDW: 15.1 % (ref 11.5–15.5)
WBC: 3.4 10*3/uL — ABNORMAL LOW (ref 4.0–10.5)

## 2014-09-22 LAB — LIPID PANEL
CHOLESTEROL: 249 mg/dL — AB (ref 0–200)
HDL: 84.7 mg/dL (ref >39.00)
LDL CALC: 154 mg/dL — AB (ref 0–99)
NonHDL: 164.3
TRIGLYCERIDES: 52 mg/dL (ref 0.0–149.0)
Total CHOL/HDL Ratio: 3
VLDL: 10.4 mg/dL (ref 0.0–40.0)

## 2014-09-22 NOTE — Progress Notes (Signed)
Pre visit review using our clinic review tool, if applicable. No additional management support is needed unless otherwise documented below in the visit note. 

## 2014-09-22 NOTE — Progress Notes (Signed)
   Subjective:    Patient ID: Courtney Calhoun, female    DOB: 1961/05/26, 52 y.o.   MRN: [id number]  HPI CPE- UTD on colonoscopy, DEXA, Mammo, Pap.   Review of Systems Patient reports no vision/ hearing changes, adenopathy,fever, weight change,  persistant/recurrent hoarseness , swallowing issues, chest pain, palpitations, edema, persistant/recurrent cough, hemoptysis, dyspnea (rest/exertional/paroxysmal nocturnal), gastrointestinal bleeding (melena, rectal bleeding), abdominal pain, significant heartburn, bowel changes, GU symptoms (dysuria, hematuria, incontinence), Gyn symptoms (abnormal  bleeding, pain),  syncope, focal weakness, memory loss, numbness & tingling, skin/hair/nail changes, abnormal bruising or bleeding, anxiety, or depression.     Objective:   Physical Exam General Appearance:    Alert, cooperative, no distress, appears stated age  Head:    Normocephalic, without obvious abnormality, atraumatic  Eyes:    PERRL, conjunctiva/corneas clear, EOM's intact, fundi    benign, both eyes  Ears:    Normal TM's and external ear canals, both ears  Nose:   Nares normal, septum midline, mucosa normal, no drainage    or sinus tenderness  Throat:   Lips, mucosa, and tongue normal; teeth and gums normal  Neck:   Supple, symmetrical, trachea midline, no adenopathy;    Thyroid: no enlargement/tenderness/nodules  Back:     Symmetric, no curvature, ROM normal, no CVA tenderness  Lungs:     Clear to auscultation bilaterally, respirations unlabored  Chest Wall:    No tenderness or deformity   Heart:    Regular rate and rhythm, S1 and S2 normal, no murmur, rub   or gallop  Breast Exam:    Deferred to GYN  Abdomen:     Soft, non-tender, bowel sounds active all four quadrants,    no masses, no organomegaly  Genitalia:    Deferred to GYN  Rectal:    Extremities:   Extremities normal, atraumatic, no cyanosis or edema  Pulses:   2+ and symmetric all extremities  Skin:   Skin color, texture,  turgor normal, no rashes or lesions  Lymph nodes:   Cervical, supraclavicular, and axillary nodes normal  Neurologic:   CNII-XII intact, normal strength, sensation and reflexes    throughout          Assessment & Plan:

## 2014-09-22 NOTE — Assessment & Plan Note (Signed)
Pt's PE WNL.  UTD on colonoscopy, mammo, pap, DEXA.  Check labs.  Anticipatory guidance provided.

## 2014-09-22 NOTE — Patient Instructions (Signed)
Follow up in 6 months to recheck cholesterol We'll notify you of your lab results and make any changes if needed Keep up the good work on healthy diet and regular exercise- you look great! Call with any questions or concerns Happy Holidays!!! 

## 2014-09-23 LAB — VITAMIN D 25 HYDROXY (VIT D DEFICIENCY, FRACTURES): VITD: 96.73 ng/mL (ref 30.00–100.00)

## 2014-09-23 LAB — TSH: TSH: 1.39 u[IU]/mL (ref 0.35–4.50)

## 2014-10-13 ENCOUNTER — Encounter: Payer: Self-pay | Admitting: Family Medicine

## 2014-10-15 ENCOUNTER — Encounter: Payer: Self-pay | Admitting: Family Medicine

## 2014-10-15 ENCOUNTER — Ambulatory Visit (INDEPENDENT_AMBULATORY_CARE_PROVIDER_SITE_OTHER): Payer: 59 | Admitting: Family Medicine

## 2014-10-15 VITALS — BP 108/74 | HR 55 | Temp 98.0°F | Resp 16 | Wt 155.2 lb

## 2014-10-15 DIAGNOSIS — M25429 Effusion, unspecified elbow: Secondary | ICD-10-CM

## 2014-10-15 DIAGNOSIS — M25529 Pain in unspecified elbow: Secondary | ICD-10-CM

## 2014-10-15 NOTE — Patient Instructions (Signed)
Follow up as needed Keep up the good work on the exercise- I'm SO proud of you!!! Continue Neosporin as needed ICE!!! Call with any questions or concerns Happy New Year!!!

## 2014-10-15 NOTE — Progress Notes (Signed)
   Subjective:    Patient ID: Courtney Calhoun, female    DOB: April 25, 1961, 54 y.o.   MRN: [id number]  HPI Elbow pain- bilateral, was exercising and skinned elbows.  Subsequently had pain and swelling.  Still tender to touch but swelling has improved.  Scraped elbows on floor 6 days ago.  Initially had 'really big blisters' but pt applied neosporin and this improved the blisters but that's when pt noticed the swelling.  No drainage.   Review of Systems For ROS see HPI     Objective:   Physical Exam  Constitutional: She appears well-developed and well-nourished. No distress.  Cardiovascular: Intact distal pulses.   Skin: Skin is warm and dry. No erythema.  Healed scabs on elbows w/o surrounding erythema/induration.  No oozing or drainage from wounds.          Assessment & Plan:

## 2014-10-15 NOTE — Progress Notes (Signed)
Pre visit review using our clinic review tool, if applicable. No additional management support is needed unless otherwise documented below in the visit note. 

## 2014-10-15 NOTE — Assessment & Plan Note (Signed)
New.  Workout injuries are healing w/o complication.  No evidence of infxn.  No evidence of olecranon bursitis.  Reviewed supportive care and red flags that should prompt return.  Pt expressed understanding and is in agreement w/ plan.

## 2015-02-01 LAB — HM PAP SMEAR

## 2015-03-24 ENCOUNTER — Ambulatory Visit: Payer: 59 | Admitting: Family Medicine

## 2015-04-02 ENCOUNTER — Ambulatory Visit (INDEPENDENT_AMBULATORY_CARE_PROVIDER_SITE_OTHER): Payer: 59 | Admitting: Family Medicine

## 2015-04-02 ENCOUNTER — Encounter: Payer: Self-pay | Admitting: Family Medicine

## 2015-04-02 VITALS — BP 106/76 | HR 78 | Temp 98.0°F | Resp 16 | Ht 66.0 in | Wt 145.0 lb

## 2015-04-02 DIAGNOSIS — E785 Hyperlipidemia, unspecified: Secondary | ICD-10-CM

## 2015-04-02 LAB — BASIC METABOLIC PANEL
BUN: 11 mg/dL (ref 6–23)
CALCIUM: 9.3 mg/dL (ref 8.4–10.5)
CO2: 30 meq/L (ref 19–32)
CREATININE: 0.93 mg/dL (ref 0.40–1.20)
Chloride: 107 mEq/L (ref 96–112)
GFR: 80.84 mL/min (ref >60.00)
Glucose, Bld: 83 mg/dL (ref 70–99)
Potassium: 3.6 mEq/L (ref 3.5–5.1)
Sodium: 142 mEq/L (ref 135–145)

## 2015-04-02 LAB — HEPATIC FUNCTION PANEL
ALT: 10 U/L (ref 0–35)
AST: 15 U/L (ref 0–37)
Albumin: 3.9 g/dL (ref 3.5–5.2)
Alkaline Phosphatase: 57 U/L (ref 39–117)
BILIRUBIN DIRECT: 0.3 mg/dL (ref 0.0–0.3)
BILIRUBIN TOTAL: 1.6 mg/dL — AB (ref 0.2–1.2)
Total Protein: 6.8 g/dL (ref 6.0–8.3)

## 2015-04-02 LAB — LIPID PANEL
Cholesterol: 237 mg/dL — ABNORMAL HIGH (ref 0–200)
HDL: 80.3 mg/dL (ref >39.00)
LDL CALC: 149 mg/dL — AB (ref 0–99)
NONHDL: 156.7
TRIGLYCERIDES: 40 mg/dL (ref 0.0–149.0)
Total CHOL/HDL Ratio: 3
VLDL: 8 mg/dL (ref 0.0–40.0)

## 2015-04-02 NOTE — Progress Notes (Signed)
Pre visit review using our clinic review tool, if applicable. No additional management support is needed unless otherwise documented below in the visit note. 

## 2015-04-02 NOTE — Assessment & Plan Note (Signed)
Chronic problem, attempting to control w/ healthy diet and regular exercise.  Not currently on statin.  Applauded her recent weight loss.  Check labs.  Start meds prn.

## 2015-04-02 NOTE — Patient Instructions (Signed)
Schedule your complete physical in 6 months We'll notify you of your lab results and make any changes if needed Keep up the good work on healthy diet and regular exercise- you look great! Call with any questions or concerns Happy 4th of July!!!

## 2015-04-02 NOTE — Progress Notes (Signed)
   Subjective:    Patient ID: Courtney Calhoun, female    DOB: 1960-12-07, 54 y.o.   MRN: [id number]  HPI Hyperlipidemia- chronic problem.  Pt is attempting to control w/ healthy diet and regular exercise.  Has lost 10 lbs!!!  Pt recently got braces and states this is why she lost weight.  Exercising regularly, 'but not as much'.  No CP, SOB, HAs, visual changes, edema, abd pain, N/V.   Review of Systems For ROS see HPI     Objective:   Physical Exam  Constitutional: She is oriented to person, place, and time. She appears well-developed and well-nourished. No distress.  HENT:  Head: Normocephalic and atraumatic.  Eyes: Conjunctivae and EOM are normal. Pupils are equal, round, and reactive to light.  Neck: Normal range of motion. Neck supple. No thyromegaly present.  Cardiovascular: Normal rate, regular rhythm, normal heart sounds and intact distal pulses.   No murmur heard. Pulmonary/Chest: Effort normal and breath sounds normal. No respiratory distress.  Abdominal: Soft. She exhibits no distension. There is no tenderness.  Musculoskeletal: She exhibits no edema.  Lymphadenopathy:    She has no cervical adenopathy.  Neurological: She is alert and oriented to person, place, and time.  Skin: Skin is warm and dry.  Psychiatric: She has a normal mood and affect. Her behavior is normal.  Vitals reviewed.         Assessment & Plan:

## 2015-06-04 LAB — HM MAMMOGRAPHY: HM Mammogram: NORMAL

## 2015-09-29 ENCOUNTER — Telehealth: Payer: Self-pay | Admitting: Behavioral Health

## 2015-09-29 ENCOUNTER — Encounter: Payer: Self-pay | Admitting: Behavioral Health

## 2015-09-29 NOTE — Telephone Encounter (Signed)
Pre-Visit Call completed with patient and chart updated.   Pre-Visit Info documented in Specialty Comments under [employer].    

## 2015-09-29 NOTE — Addendum Note (Signed)
Addended by: Kathlen Brunswick on: 09/29/2015 03:27 PM   Modules accepted: Orders, Medications

## 2015-09-29 NOTE — Telephone Encounter (Signed)
Unable to reach patient at time of Pre-Visit Call.  Left message for patient to return call when available.    

## 2015-09-30 ENCOUNTER — Ambulatory Visit (INDEPENDENT_AMBULATORY_CARE_PROVIDER_SITE_OTHER): Payer: 59 | Admitting: Family Medicine

## 2015-09-30 ENCOUNTER — Encounter: Payer: Self-pay | Admitting: Family Medicine

## 2015-09-30 VITALS — BP 112/82 | HR 76 | Temp 97.9°F | Resp 16 | Ht 66.0 in | Wt 144.0 lb

## 2015-09-30 DIAGNOSIS — Z0000 Encounter for general adult medical examination without abnormal findings: Secondary | ICD-10-CM | POA: Diagnosis not present

## 2015-09-30 LAB — HEPATIC FUNCTION PANEL
ALK PHOS: 60 U/L (ref 39–117)
ALT: 8 U/L (ref 0–35)
AST: 11 U/L (ref 0–37)
Albumin: 4 g/dL (ref 3.5–5.2)
BILIRUBIN TOTAL: 1.3 mg/dL — AB (ref 0.2–1.2)
Bilirubin, Direct: 0.2 mg/dL (ref 0.0–0.3)
Total Protein: 7.2 g/dL (ref 6.0–8.3)

## 2015-09-30 LAB — CBC WITH DIFFERENTIAL/PLATELET
BASOS PCT: 0 % (ref 0.0–3.0)
Basophils Absolute: 0 10*3/uL (ref 0.0–0.1)
EOS ABS: 0.4 10*3/uL (ref 0.0–0.7)
EOS PCT: 2.4 % (ref 0.0–5.0)
HCT: 40.3 % (ref 36.0–46.0)
HEMOGLOBIN: 13.1 g/dL (ref 12.0–15.0)
LYMPHS ABS: 2.5 10*3/uL (ref 0.7–4.0)
Lymphocytes Relative: 25.8 % (ref 12.0–46.0)
MCHC: 32.5 g/dL (ref 30.0–36.0)
MCV: 84.6 fl (ref 78.0–100.0)
MONO ABS: 1 10*3/uL (ref 0.1–1.0)
Monocytes Relative: 10.2 % (ref 3.0–12.0)
NEUTROS ABS: 2 10*3/uL (ref 1.4–7.7)
NEUTROS PCT: 61.6 % (ref 43.0–77.0)
PLATELETS: 185 10*3/uL (ref 150.0–400.0)
RBC: 4.76 Mil/uL (ref 3.87–5.11)
RDW: 15.5 % (ref 11.5–15.5)
WBC: 4.1 10*3/uL (ref 4.0–10.5)

## 2015-09-30 LAB — LIPID PANEL
Cholesterol: 247 mg/dL — ABNORMAL HIGH (ref 0–200)
HDL: 108.1 mg/dL (ref >39.00)
LDL Cholesterol: 131 mg/dL — ABNORMAL HIGH (ref 0–99)
NONHDL: 138.86
TRIGLYCERIDES: 37 mg/dL (ref 0.0–149.0)
Total CHOL/HDL Ratio: 2
VLDL: 7.4 mg/dL (ref 0.0–40.0)

## 2015-09-30 LAB — BASIC METABOLIC PANEL
BUN: 14 mg/dL (ref 6–23)
CHLORIDE: 109 meq/L (ref 96–112)
CO2: 26 meq/L (ref 19–32)
CREATININE: 0.96 mg/dL (ref 0.40–1.20)
Calcium: 9.5 mg/dL (ref 8.4–10.5)
GFR: 77.79 mL/min (ref >60.00)
Glucose, Bld: 80 mg/dL (ref 70–99)
POTASSIUM: 3.8 meq/L (ref 3.5–5.1)
Sodium: 143 mEq/L (ref 135–145)

## 2015-09-30 LAB — VITAMIN D 25 HYDROXY (VIT D DEFICIENCY, FRACTURES): VITD: 95.34 ng/mL (ref 30.00–100.00)

## 2015-09-30 LAB — TSH: TSH: 0.99 u[IU]/mL (ref 0.35–4.50)

## 2015-09-30 NOTE — Progress Notes (Signed)
Pre visit review using our clinic review tool, if applicable. No additional management support is needed unless otherwise documented below in the visit note. 

## 2015-09-30 NOTE — Assessment & Plan Note (Signed)
Pt's PE WNL.  UTD on GYN, colonoscopy.  Check labs.  Anticipatory guidance provided.  

## 2015-09-30 NOTE — Progress Notes (Signed)
   Subjective:    Patient ID: Courtney Calhoun, female    DOB: 1961/03/10, 54 y.o.   MRN: [id number]  HPI CPE- UTD on GYN ([facility])   Review of Systems Patient reports no vision/ hearing changes, adenopathy,fever, weight change,  persistant/recurrent hoarseness , swallowing issues, chest pain, palpitations, edema, persistant/recurrent cough, hemoptysis, dyspnea (rest/exertional/paroxysmal nocturnal), gastrointestinal bleeding (melena, rectal bleeding), abdominal pain, significant heartburn, bowel changes, GU symptoms (dysuria, hematuria, incontinence), Gyn symptoms (abnormal  bleeding, pain),  syncope, focal weakness, memory loss, numbness & tingling, skin/hair/nail changes, abnormal bruising or bleeding, anxiety, or depression.     Objective:   Physical Exam General Appearance:    Alert, cooperative, no distress, appears stated age  Head:    Normocephalic, without obvious abnormality, atraumatic  Eyes:    PERRL, conjunctiva/corneas clear, EOM's intact, fundi    benign, both eyes  Ears:    Normal TM's and external ear canals, both ears  Nose:   Nares normal, septum midline, mucosa normal, no drainage    or sinus tenderness  Throat:   Lips, mucosa, and tongue normal; teeth and gums normal  Neck:   Supple, symmetrical, trachea midline, no adenopathy;    Thyroid: no enlargement/tenderness/nodules  Back:     Symmetric, no curvature, ROM normal, no CVA tenderness  Lungs:     Clear to auscultation bilaterally, respirations unlabored  Chest Wall:    No tenderness or deformity   Heart:    Regular rate and rhythm, S1 and S2 normal, no murmur, rub   or gallop  Breast Exam:    Deferred to GYN  Abdomen:     Soft, non-tender, bowel sounds active all four quadrants,    no masses, no organomegaly  Genitalia:    Deferred to GYN  Rectal:    Extremities:   Extremities normal, atraumatic, no cyanosis or edema  Pulses:   2+ and symmetric all extremities  Skin:   Skin color, texture, turgor  normal, no rashes or lesions  Lymph nodes:   Cervical, supraclavicular, and axillary nodes normal  Neurologic:   CNII-XII intact, normal strength, sensation and reflexes    throughout          Assessment & Plan:

## 2015-09-30 NOTE — Patient Instructions (Signed)
Follow up in 1 year or as needed We'll notify you of your lab results and make any changes if needed Keep up the good work on healthy diet and regular exercise- you look great! Call with any questions or concerns If you want to join [place] at the new [place] office, any scheduled appointments will automatically transfer and we will see you at [address] [place] [address], [place],  [address] ([place]) Happy New Year!!!

## 2015-10-15 ENCOUNTER — Ambulatory Visit (INDEPENDENT_AMBULATORY_CARE_PROVIDER_SITE_OTHER): Payer: 59 | Admitting: Family Medicine

## 2015-10-15 ENCOUNTER — Encounter: Payer: Self-pay | Admitting: Family Medicine

## 2015-10-15 VITALS — BP 118/82 | HR 68 | Temp 98.1°F | Ht 66.0 in | Wt 148.4 lb

## 2015-10-15 DIAGNOSIS — M1810 Unilateral primary osteoarthritis of first carpometacarpal joint, unspecified hand: Secondary | ICD-10-CM | POA: Diagnosis not present

## 2015-10-15 DIAGNOSIS — M189 Osteoarthritis of first carpometacarpal joint, unspecified: Secondary | ICD-10-CM | POA: Insufficient documentation

## 2015-10-15 MED ORDER — MELOXICAM 15 MG PO TABS: 15.0000 mg | ORAL_TABLET | Freq: Every day | ORAL | Status: DC

## 2015-10-15 NOTE — Patient Instructions (Signed)
Follow up by phone or MyCart in 7-10 days and let me know how you're feeling Start the Mobic once daily w/ food ICE!!! Call with any questions or concerns Hang in there!!! Happy New Year!!!

## 2015-10-15 NOTE — Progress Notes (Signed)
Pre visit review using our clinic review tool, if applicable. No additional management support is needed unless otherwise documented below in the visit note. 

## 2015-10-15 NOTE — Progress Notes (Signed)
   Subjective:    Patient ID: Courtney Calhoun, female    DOB: 10-Feb-1961, 55 y.o.   MRN: [id number]  HPI L hand pain- sxs started around Christmas.  Non-dominant hand.  No known injury.  Pt is having pain involving thumb and MCP/CMC joint.  Intermittently swells.  No change in activity level.  Some improvement w/ biofreeze but this is temporary.  Has not tried OTC NSAIDs.   Review of Systems For ROS see HPI     Objective:   Physical Exam  Constitutional: She is oriented to person, place, and time. She appears well-developed and well-nourished. No distress.  Cardiovascular: Intact distal pulses.   Musculoskeletal: She exhibits tenderness (TTP over L CMC joint, full ROM of thumb). She exhibits no edema.  Neurological: She is alert and oriented to person, place, and time. She has normal reflexes.  Skin: Skin is warm and dry. No erythema.  Vitals reviewed.         Assessment & Plan:

## 2015-10-18 NOTE — Assessment & Plan Note (Signed)
New.  Pt's pain is localized to L CMC joint.  No edema.  + TTP.  Full ROM.  Start scheduled NSAIDs.  Ice.  If no improvement, will need appt w/ hand specialist.  Pt expressed understanding and is in agreement w/ plan.

## 2015-10-22 ENCOUNTER — Encounter: Payer: Self-pay | Admitting: Family Medicine

## 2015-10-28 ENCOUNTER — Ambulatory Visit: Payer: 59 | Admitting: Family Medicine

## 2015-10-28 ENCOUNTER — Encounter: Payer: Self-pay | Admitting: Family Medicine

## 2015-10-28 ENCOUNTER — Ambulatory Visit (INDEPENDENT_AMBULATORY_CARE_PROVIDER_SITE_OTHER): Payer: 59 | Admitting: Family Medicine

## 2015-10-28 VITALS — BP 122/75 | HR 60 | Temp 98.1°F | Ht 66.0 in | Wt 145.0 lb

## 2015-10-28 DIAGNOSIS — R600 Localized edema: Secondary | ICD-10-CM

## 2015-10-28 NOTE — Progress Notes (Signed)
   Subjective:    Patient ID: Courtney Calhoun, female    DOB: 08-Jul-1961, 55 y.o.   MRN: [id number]  HPI Leg swelling- bilateral, sxs started in correlation w/ the meloxicam.  Pt notes indentations in lower legs from socks.  No discomfort from swelling.  No SOB, CP.  Pt reports eating a lot of processed foods during snow but eats a lot of processed foods at baseline.  No cough, no swelling of hands.   Review of Systems For ROS see HPI     Objective:   Physical Exam  Constitutional: She is oriented to person, place, and time. She appears well-developed and well-nourished. No distress.  HENT:  Head: Normocephalic and atraumatic.  Eyes: Conjunctivae and EOM are normal. Pupils are equal, round, and reactive to light.  Neck: Normal range of motion. Neck supple. No thyromegaly present.  Cardiovascular: Normal rate, regular rhythm, normal heart sounds and intact distal pulses.   Pulmonary/Chest: Effort normal and breath sounds normal. No respiratory distress. She has no wheezes. She has no rales.  Musculoskeletal: She exhibits no edema (pt has sock indentations in both legs but no edema on either side) or tenderness.  Lymphadenopathy:    She has no cervical adenopathy.  Neurological: She is alert and oriented to person, place, and time.  Skin: Skin is warm and dry. No erythema.  Psychiatric: She has a normal mood and affect. Her behavior is normal. Thought content normal.  Vitals reviewed.         Assessment & Plan:

## 2015-10-28 NOTE — Assessment & Plan Note (Signed)
New.  Pt has no swelling today on exam.  Reviewed the difference between indentations and edema.  Pt's PE WNL.  Reviewed recent labs- no abnormalities.  Encouraged her to eat less Na and increase water intake.  Pt is anxious b/c friend was recently dx'd w/ CHF.  Provided reassurance and gave her red flags to watch for.

## 2015-10-28 NOTE — Progress Notes (Signed)
Pre visit review using our clinic review tool, if applicable. No additional management support is needed unless otherwise documented below in the visit note. 

## 2015-10-28 NOTE — Patient Instructions (Signed)
Follow up as needed Try and limit your salt intake (processed foods, lunch meat, sausage, bacon, soup, etc) Increase your water intake Continue to be active- this will pump out any fluid Call with any questions or concerns You're doing great!!!

## 2015-12-25 ENCOUNTER — Encounter: Payer: Self-pay | Admitting: Gastroenterology

## 2016-06-17 LAB — HM MAMMOGRAPHY

## 2016-06-20 ENCOUNTER — Encounter: Payer: Self-pay | Admitting: General Practice

## 2016-11-07 ENCOUNTER — Encounter: Payer: Self-pay | Admitting: Family Medicine

## 2016-11-07 ENCOUNTER — Ambulatory Visit (INDEPENDENT_AMBULATORY_CARE_PROVIDER_SITE_OTHER): Payer: 59 | Admitting: Family Medicine

## 2016-11-07 VITALS — BP 118/76 | HR 70 | Temp 98.1°F | Resp 16 | Ht 66.0 in | Wt 149.0 lb

## 2016-11-07 DIAGNOSIS — Z0000 Encounter for general adult medical examination without abnormal findings: Secondary | ICD-10-CM

## 2016-11-07 LAB — CBC WITH DIFFERENTIAL/PLATELET
BASOS ABS: 0 10*3/uL (ref 0.0–0.1)
Basophils Relative: 0.4 % (ref 0.0–3.0)
EOS ABS: 0 10*3/uL (ref 0.0–0.7)
Eosinophils Relative: 0.7 % (ref 0.0–5.0)
HEMATOCRIT: 40.4 % (ref 36.0–46.0)
HEMOGLOBIN: 13.7 g/dL (ref 12.0–15.0)
LYMPHS PCT: 48.2 % — AB (ref 12.0–46.0)
Lymphs Abs: 2 10*3/uL (ref 0.7–4.0)
MCHC: 33.8 g/dL (ref 30.0–36.0)
MCV: 83.1 fl (ref 78.0–100.0)
MONO ABS: 0.4 10*3/uL (ref 0.1–1.0)
Monocytes Relative: 9.1 % (ref 3.0–12.0)
Neutro Abs: 1.7 10*3/uL (ref 1.4–7.7)
Neutrophils Relative %: 41.6 % — ABNORMAL LOW (ref 43.0–77.0)
PLATELETS: 170 10*3/uL (ref 150.0–400.0)
RBC: 4.86 Mil/uL (ref 3.87–5.11)
RDW: 15 % (ref 11.5–15.5)
WBC: 4.1 10*3/uL (ref 4.0–10.5)

## 2016-11-07 LAB — LIPID PANEL
Cholesterol: 269 mg/dL — ABNORMAL HIGH (ref 0–200)
HDL: 100.5 mg/dL (ref >39.00)
LDL Cholesterol: 161 mg/dL — ABNORMAL HIGH (ref 0–99)
NONHDL: 168.51
Total CHOL/HDL Ratio: 3
Triglycerides: 37 mg/dL (ref 0.0–149.0)
VLDL: 7.4 mg/dL (ref 0.0–40.0)

## 2016-11-07 LAB — HEPATIC FUNCTION PANEL
ALBUMIN: 4.3 g/dL (ref 3.5–5.2)
ALT: 8 U/L (ref 0–35)
AST: 11 U/L (ref 0–37)
Alkaline Phosphatase: 56 U/L (ref 39–117)
Bilirubin, Direct: 0.2 mg/dL (ref 0.0–0.3)
TOTAL PROTEIN: 7.1 g/dL (ref 6.0–8.3)
Total Bilirubin: 1.5 mg/dL — ABNORMAL HIGH (ref 0.2–1.2)

## 2016-11-07 LAB — BASIC METABOLIC PANEL
BUN: 12 mg/dL (ref 6–23)
CO2: 31 mEq/L (ref 19–32)
CREATININE: 0.83 mg/dL (ref 0.40–1.20)
Calcium: 9.5 mg/dL (ref 8.4–10.5)
Chloride: 105 mEq/L (ref 96–112)
GFR: 91.64 mL/min (ref >60.00)
Glucose, Bld: 88 mg/dL (ref 70–99)
Potassium: 4.7 mEq/L (ref 3.5–5.1)
Sodium: 140 mEq/L (ref 135–145)

## 2016-11-07 LAB — TSH: TSH: 1.07 u[IU]/mL (ref 0.35–4.50)

## 2016-11-07 LAB — VITAMIN D 25 HYDROXY (VIT D DEFICIENCY, FRACTURES): VITD: 85.14 ng/mL (ref 30.00–100.00)

## 2016-11-07 NOTE — Progress Notes (Signed)
   Subjective:    Patient ID: Courtney Calhoun, female    DOB: [birth year], 56 y.o.   MRN: [id number]  HPI CPE- UTD on pap Adelene Idler) and mammo, due for repeat colonoscopy in 2020, UTD on Tdap.   Review of Systems Patient reports no vision/ hearing changes, adenopathy,fever, weight change,  persistant/recurrent hoarseness , swallowing issues, chest pain, palpitations, edema, persistant/recurrent cough, hemoptysis, dyspnea (rest/exertional/paroxysmal nocturnal), gastrointestinal bleeding (melena, rectal bleeding), abdominal pain, significant heartburn, bowel changes, GU symptoms (dysuria, hematuria, incontinence), Gyn symptoms (abnormal  bleeding, pain),  syncope, focal weakness, memory loss, numbness & tingling, skin/hair/nail changes, abnormal bruising or bleeding, anxiety, or depression.     Objective:   Physical Exam General Appearance:    Alert, cooperative, no distress, appears stated age  Head:    Normocephalic, without obvious abnormality, atraumatic  Eyes:    PERRL, conjunctiva/corneas clear, EOM's intact, fundi    benign, both eyes  Ears:    Normal TM's and external ear canals, both ears  Nose:   Nares normal, septum midline, mucosa normal, no drainage    or sinus tenderness  Throat:   Lips, mucosa, and tongue normal; teeth and gums normal  Neck:   Supple, symmetrical, trachea midline, no adenopathy;    Thyroid: no enlargement/tenderness/nodules  Back:     Symmetric, no curvature, ROM normal, no CVA tenderness  Lungs:     Clear to auscultation bilaterally, respirations unlabored  Chest Wall:    No tenderness or deformity   Heart:    Regular rate and rhythm, S1 and S2 normal, no murmur, rub   or gallop  Breast Exam:    Deferred to GYN  Abdomen:     Soft, non-tender, bowel sounds active all four quadrants,    no masses, no organomegaly  Genitalia:    Deferred to GYN  Rectal:    Extremities:   Extremities normal, atraumatic, no cyanosis or edema  Pulses:   2+ and symmetric  all extremities  Skin:   Skin color, texture, turgor normal, no rashes or lesions  Lymph nodes:   Cervical, supraclavicular, and axillary nodes normal  Neurologic:   CNII-XII intact, normal strength, sensation and reflexes    throughout          Assessment & Plan:

## 2016-11-07 NOTE — Patient Instructions (Addendum)
Follow up in 1 year or as needed We'll notify you of your lab results and make any changes if needed Keep up the good work on healthy diet and regular exercise- you look great! Call with any questions or concerns Happy Valentine's Day! 

## 2016-11-07 NOTE — Progress Notes (Signed)
Pre visit review using our clinic review tool, if applicable. No additional management support is needed unless otherwise documented below in the visit note. 

## 2016-11-07 NOTE — Assessment & Plan Note (Signed)
Pt's PE WNL.  UTD on colonoscopy, Tdap, mammo, pap.  Check labs.  Anticipatory guidance provided.

## 2016-11-08 ENCOUNTER — Other Ambulatory Visit: Payer: Self-pay | Admitting: General Practice

## 2016-11-08 DIAGNOSIS — E785 Hyperlipidemia, unspecified: Secondary | ICD-10-CM

## 2016-11-08 MED ORDER — ATORVASTATIN CALCIUM 10 MG PO TABS: 10.0000 mg | ORAL_TABLET | Freq: Every day | ORAL | 6 refills | Status: DC

## 2016-11-21 ENCOUNTER — Ambulatory Visit: Payer: 59 | Admitting: Family Medicine

## 2016-12-08 ENCOUNTER — Encounter: Payer: Self-pay | Admitting: Family Medicine

## 2016-12-22 DIAGNOSIS — H04123 Dry eye syndrome of bilateral lacrimal glands: Secondary | ICD-10-CM | POA: Diagnosis not present

## 2016-12-22 DIAGNOSIS — H524 Presbyopia: Secondary | ICD-10-CM | POA: Diagnosis not present

## 2016-12-22 DIAGNOSIS — H402231 Chronic angle-closure glaucoma, bilateral, mild stage: Secondary | ICD-10-CM | POA: Diagnosis not present

## 2017-01-05 ENCOUNTER — Other Ambulatory Visit (INDEPENDENT_AMBULATORY_CARE_PROVIDER_SITE_OTHER): Payer: 59

## 2017-01-05 DIAGNOSIS — E785 Hyperlipidemia, unspecified: Secondary | ICD-10-CM

## 2017-01-05 LAB — HEPATIC FUNCTION PANEL
ALBUMIN: 4.1 g/dL (ref 3.5–5.2)
ALK PHOS: 55 U/L (ref 39–117)
ALT: 15 U/L (ref 0–35)
AST: 13 U/L (ref 0–37)
BILIRUBIN TOTAL: 1.4 mg/dL — AB (ref 0.2–1.2)
Bilirubin, Direct: 0.3 mg/dL (ref 0.0–0.3)
TOTAL PROTEIN: 6.7 g/dL (ref 6.0–8.3)

## 2017-01-17 ENCOUNTER — Encounter: Payer: Self-pay | Admitting: Family Medicine

## 2017-01-18 MED ORDER — ATORVASTATIN CALCIUM 10 MG PO TABS: 10.0000 mg | ORAL_TABLET | Freq: Every day | ORAL | 1 refills | Status: DC

## 2017-03-20 DIAGNOSIS — Z6824 Body mass index (BMI) 24.0-24.9, adult: Secondary | ICD-10-CM | POA: Diagnosis not present

## 2017-03-20 DIAGNOSIS — Z01419 Encounter for gynecological examination (general) (routine) without abnormal findings: Secondary | ICD-10-CM | POA: Diagnosis not present

## 2017-06-18 ENCOUNTER — Other Ambulatory Visit: Payer: Self-pay | Admitting: Family Medicine

## 2017-06-19 DIAGNOSIS — M79672 Pain in left foot: Secondary | ICD-10-CM | POA: Diagnosis not present

## 2017-06-22 DIAGNOSIS — Z1231 Encounter for screening mammogram for malignant neoplasm of breast: Secondary | ICD-10-CM | POA: Diagnosis not present

## 2017-06-22 LAB — HM MAMMOGRAPHY

## 2017-06-27 ENCOUNTER — Encounter: Payer: Self-pay | Admitting: General Practice

## 2017-06-28 DIAGNOSIS — M79672 Pain in left foot: Secondary | ICD-10-CM | POA: Diagnosis not present

## 2017-06-29 DIAGNOSIS — H402231 Chronic angle-closure glaucoma, bilateral, mild stage: Secondary | ICD-10-CM | POA: Diagnosis not present

## 2017-06-29 DIAGNOSIS — H25013 Cortical age-related cataract, bilateral: Secondary | ICD-10-CM | POA: Diagnosis not present

## 2017-06-29 DIAGNOSIS — H04123 Dry eye syndrome of bilateral lacrimal glands: Secondary | ICD-10-CM | POA: Diagnosis not present

## 2017-08-12 ENCOUNTER — Other Ambulatory Visit: Payer: Self-pay | Admitting: Family Medicine

## 2017-09-21 DIAGNOSIS — R350 Frequency of micturition: Secondary | ICD-10-CM | POA: Diagnosis not present

## 2017-09-21 DIAGNOSIS — N390 Urinary tract infection, site not specified: Secondary | ICD-10-CM | POA: Diagnosis not present

## 2017-10-02 ENCOUNTER — Ambulatory Visit: Payer: 59 | Admitting: Physician Assistant

## 2017-10-02 ENCOUNTER — Telehealth: Payer: Self-pay | Admitting: *Deleted

## 2017-10-02 ENCOUNTER — Other Ambulatory Visit: Payer: Self-pay

## 2017-10-02 VITALS — BP 122/68 | HR 83 | Temp 98.1°F | Resp 16 | Ht 66.0 in | Wt 157.0 lb

## 2017-10-02 DIAGNOSIS — M7918 Myalgia, other site: Secondary | ICD-10-CM

## 2017-10-02 LAB — POCT URINALYSIS DIP (MANUAL ENTRY)
BILIRUBIN UA: NEGATIVE mg/dL
Bilirubin, UA: NEGATIVE
GLUCOSE UA: NEGATIVE mg/dL
LEUKOCYTES UA: NEGATIVE
Nitrite, UA: NEGATIVE
PROTEIN UA: NEGATIVE mg/dL
SPEC GRAV UA: 1.025 (ref 1.010–1.025)
UROBILINOGEN UA: 0.2 U/dL
pH, UA: 5.5 (ref 5.0–8.0)

## 2017-10-02 LAB — POCT URINE PREGNANCY: PREG TEST UR: NEGATIVE

## 2017-10-02 LAB — POCT WET + KOH PREP
TRICH BY WET PREP: ABSENT
YEAST BY KOH: ABSENT
YEAST BY WET PREP: ABSENT

## 2017-10-02 MED ORDER — GUAIFENESIN ER 1200 MG PO TB12: 1.0000 | ORAL_TABLET | Freq: Two times a day (BID) | ORAL | 1 refills | Status: DC | PRN

## 2017-10-02 MED ORDER — DOXYCYCLINE HYCLATE 100 MG PO CAPS: 100.0000 mg | ORAL_CAPSULE | Freq: Two times a day (BID) | ORAL | 0 refills | Status: DC

## 2017-10-02 NOTE — Progress Notes (Addendum)
PRIMARY CARE AT [facility] [address] [place] [address] [phone]  Date:  10/02/2017   Name:  Courtney Calhoun   DOB:  01-05-1961   MRN:  [id number]  PCP:  Midge Minium, MD    History of Present Illness:  Courtney Calhoun is a 56 y.o. female patient who presents to PCP with  Chief Complaint  Patient presents with  . Numbness    feet, butt, and vaginal area. pt states she had a uti last wk, that she was treated for.   --patient is cocnerned that there is a tenderness in her right buttock where she describes as uncomoftoable.  It has been for over 1 week and tender.  Hurts to sit on or lay on.  She has noticed that tehre is some improvement to the sympotms No warmth to the area No fever or malaise. No hematuria, dysuria, or fever No cosntiaption or darrhea. No abdominal pain She has noticed some numbness to her feet associated with the pain, but this has resolved.         Patient Active Problem List   Diagnosis Date Noted  . Bilateral leg edema 10/28/2015  . Unilateral osteoarthritis of first carpometacarpal (CMC) joint 10/15/2015  . Pain and swelling of elbow 10/15/2014  . Tachycardia 01/10/2013  . General medical examination 07/13/2011  . Hyperlipidemia 11/08/2010  . ALLERGIC RHINITIS 11/08/2010  . OSTEOPENIA 11/08/2010    Past Medical History:  Diagnosis Date  . Anxiety   . Depression   . Gilbert's syndrome   . Hematuria   . Hyperlipidemia   . Osteopenia     Past Surgical History:  Procedure Laterality Date  . CHOLECYSTECTOMY    . EYE SURGERY    . INNER EAR SURGERY      Social History   Tobacco Use  . Smoking status: Never Smoker  . Smokeless tobacco: Never Used  Substance Use Topics  . Alcohol use: No  . Drug use: No    Family History  Problem Relation Age of Onset  . Hypertension Mother   . Cancer Maternal Aunt        colon  . Stroke Maternal Grandmother   . Hyperlipidemia Paternal Grandfather     Allergies  Allergen  Reactions  . Aspirin     REACTION: GI Intolerance  . Ibuprofen     REACTION: GI Intolerance    Medication list has been reviewed and updated.  Current Outpatient Medications on File Prior to Visit  Medication Sig Dispense Refill  . atorvastatin (LIPITOR) 10 MG tablet TAKE 1 TABLET BY MOUTH  DAILY. 90 tablet 0  . Calcium Carbonate-Vit D-Min (CALCIUM 1200) 1200-1000 MG-UNIT CHEW Chew by mouth daily. Reported on 09/29/2015    . cetirizine (ZYRTEC) 10 MG chewable tablet Chew 1 tablet (10 mg total) by mouth daily. 90 tablet 3  . Cholecalciferol (VITAMIN D) 2000 UNITS tablet Take 2,000 Units by mouth daily.      [name] latanoprost (XALATAN) 0.005 % ophthalmic solution Place 1 drop into both eyes at bedtime.    Vladimir Faster Glycol-Propyl Glycol (SYSTANE OP) Apply to eye.     No current facility-administered medications on file prior to visit.     ROS ROS otherwise unremarkable unless listed above.  Physical Examination: BP 122/68   Pulse 83   Temp 98.1 F (36.7 C) (Oral)   Resp 16   Ht 5\' 6"  (1.676 m)   Wt 157 lb (71.2 kg)   SpO2 96%  BMI 25.34 kg/m  Ideal Body Weight: Weight in (lb) to have BMI = 25: 154.6  Physical Exam  Constitutional: She is oriented to person, place, and time. She appears well-developed and well-nourished. No distress.  HENT:  Head: Normocephalic and atraumatic.  Right Ear: External ear normal.  Left Ear: External ear normal.  Eyes: Conjunctivae and EOM are normal. Pupils are equal, round, and reactive to light.  Cardiovascular: Normal rate.  Pulmonary/Chest: Effort normal. No respiratory distress.  Genitourinary: Vagina normal. Rectal exam shows no external hemorrhoid, no internal hemorrhoid and no mass. Pelvic exam was performed with patient supine. No erythema or bleeding in the vagina. No vaginal discharge found.  Genitourinary Comments: No tenderness with palpation along the vaginal cananl.  No swelling or erytema No tenderness at the rectal area.  No  swelling.  Musculoskeletal:       Lumbar back: She exhibits normal range of motion, no tenderness and no bony tenderness.  Mild tenderness at the left medial buttocks.  No erythema or swelling detected.  This is also prominent around the piriformis area.   Lower extremity strength in tact  Neurological: She is alert and oriented to person, place, and time.  Reflex Scores:      Patellar reflexes are 2+ on the right side and 2+ on the left side. Skin: She is not diaphoretic.  Psychiatric: She has a normal mood and affect. Her behavior is normal.   Results for orders placed or performed in visit on 10/02/17  POCT Wet + KOH Prep  Result Value Ref Range   Yeast by KOH Absent Absent   Yeast by wet prep Absent Absent   WBC by wet prep None (A) Few   Clue Cells Wet Prep HPF POC None None   Trich by wet prep Absent Absent   Bacteria Wet Prep HPF POC Many (A) Few   Epithelial Cells By [employer] Pref ([facility]) Moderate (A) None, Few, Too numerous to count   RBC,UR,HPF,POC None None RBC/hpf  POCT urinalysis dipstick  Result Value Ref Range   Color, UA yellow yellow   Clarity, UA cloudy (A) clear   Glucose, UA negative negative mg/dL   Bilirubin, UA negative negative   Ketones, POC UA negative negative mg/dL   Spec Grav, UA 1.025 1.010 - 1.025   Blood, UA trace-intact (A) negative   pH, UA 5.5 5.0 - 8.0   Protein Ur, POC negative negative mg/dL   Urobilinogen, UA 0.2 0.2 or 1.0 E.U./dL   Nitrite, UA Negative Negative   Leukocytes, UA Negative Negative  POCT urine pregnancy  Result Value Ref Range   Preg Test, Ur Negative Negative   Dg Lumbar Spine Complete  Result Date: 10/08/2017 CLINICAL DATA:  Occasional numbness of lower extremities. EXAM: LUMBAR SPINE - COMPLETE 4+ VIEW COMPARISON:  None. FINDINGS: Broad-based levo scoliotic curvature of the lumbar spine. Vertebral body heights are normal. There is no listhesis. The posterior elements are intact. Disc spaces are preserved. No fracture.  Sacroiliac joints are symmetric and normal. IMPRESSION: Mild broad-based levo scoliotic curvature. Otherwise unremarkable lumbar spine radiographs. Electronically Signed   By: Jeb Levering M.D.   On: 10/08/2017 06:16     Assessment and Plan: Courtney Calhoun is a 56 y.o. female who is here today for cc of  Chief Complaint  Patient presents with  . Numbness    feet, butt, and vaginal area. pt states she had a uti last wk, that she was treated for.  -- possible piriformis  strain vs infection.  Advised warm compresses, anti-inflammatory use, and doxycycline  --rtc in 1 week.   --alarming symptoms discussed to warrant an immediate return. Buttock pain - Plan: POCT Wet + KOH Prep, POCT urinalysis dipstick, POCT urine pregnancy, doxycycline (VIBRAMYCIN) 100 MG capsule  Ivar Drape, PA-C [facility] 1/6/20193:36 PM

## 2017-10-02 NOTE — Patient Instructions (Addendum)
I would like you to use warm compresses to the buttocks.  Three times per day for 15 minutes.  You can put a sock full of rice and warm it.   Please take the antibiotic compliantly at this time.  If you have increased fever, pain, dizziness, or increased swelling

## 2017-10-02 NOTE — Telephone Encounter (Signed)
Called pharmacy to cancel Mucinex, per [employer].

## 2017-10-08 ENCOUNTER — Encounter: Payer: Self-pay | Admitting: Physician Assistant

## 2017-10-08 ENCOUNTER — Encounter (HOSPITAL_BASED_OUTPATIENT_CLINIC_OR_DEPARTMENT_OTHER): Payer: Self-pay | Admitting: *Deleted

## 2017-10-08 ENCOUNTER — Other Ambulatory Visit: Payer: Self-pay

## 2017-10-08 ENCOUNTER — Emergency Department (HOSPITAL_BASED_OUTPATIENT_CLINIC_OR_DEPARTMENT_OTHER): Payer: 59

## 2017-10-08 ENCOUNTER — Emergency Department (HOSPITAL_BASED_OUTPATIENT_CLINIC_OR_DEPARTMENT_OTHER)
Admission: EM | Admit: 2017-10-08 | Discharge: 2017-10-08 | Disposition: A | Discharge status: Home / Self Care | Payer: 59 | Attending: Physician Assistant | Admitting: Physician Assistant

## 2017-10-08 DIAGNOSIS — Z79899 Other long term (current) drug therapy: Secondary | ICD-10-CM | POA: Insufficient documentation

## 2017-10-08 DIAGNOSIS — R200 Anesthesia of skin: Secondary | ICD-10-CM | POA: Diagnosis present

## 2017-10-08 DIAGNOSIS — R202 Paresthesia of skin: Secondary | ICD-10-CM | POA: Diagnosis not present

## 2017-10-08 DIAGNOSIS — R29898 Other symptoms and signs involving the musculoskeletal system: Secondary | ICD-10-CM | POA: Diagnosis not present

## 2017-10-08 LAB — URINALYSIS, ROUTINE W REFLEX MICROSCOPIC
Bilirubin Urine: NEGATIVE
Glucose, UA: NEGATIVE mg/dL
KETONES UR: 15 mg/dL — AB
Leukocytes, UA: NEGATIVE
Nitrite: NEGATIVE
PROTEIN: NEGATIVE mg/dL
Specific Gravity, Urine: 1.025 (ref 1.005–1.030)
pH: 6 (ref 5.0–8.0)

## 2017-10-08 LAB — I-STAT CHEM 8, ED
BUN: 10 mg/dL (ref 6–20)
CALCIUM ION: 1.21 mmol/L (ref 1.15–1.40)
CHLORIDE: 105 mmol/L (ref 101–111)
CREATININE: 0.8 mg/dL (ref 0.44–1.00)
GLUCOSE: 93 mg/dL (ref 65–99)
HCT: 41 % (ref 36.0–46.0)
Hemoglobin: 13.9 g/dL (ref 12.0–15.0)
Potassium: 4 mmol/L (ref 3.5–5.1)
Sodium: 140 mmol/L (ref 135–145)
TCO2: 24 mmol/L (ref 22–32)

## 2017-10-08 LAB — COMPREHENSIVE METABOLIC PANEL
ALBUMIN: 3.8 g/dL (ref 3.5–5.0)
ALK PHOS: 58 U/L (ref 38–126)
ALT: 14 U/L (ref 14–54)
AST: 20 U/L (ref 15–41)
BUN: 12 mg/dL (ref 6–20)
Creatinine, Ser: 0.89 mg/dL (ref 0.44–1.00)
GFR calc Af Amer: >60 mL/min (ref >60)
GFR calc non Af Amer: >60 mL/min (ref >60)
TOTAL PROTEIN: 7.3 g/dL (ref 6.5–8.1)
Total Bilirubin: 1.2 mg/dL (ref 0.3–1.2)

## 2017-10-08 LAB — CBC WITH DIFFERENTIAL/PLATELET
Basophils Absolute: 0 10*3/uL (ref 0.0–0.1)
Basophils Relative: 0 %
EOS PCT: 0 %
Eosinophils Absolute: 0 10*3/uL (ref 0.0–0.7)
HEMATOCRIT: 35.6 % — AB (ref 36.0–46.0)
Hemoglobin: 12.9 g/dL (ref 12.0–15.0)
Lymphocytes Relative: 30 %
Lymphs Abs: 1.4 10*3/uL (ref 0.7–4.0)
MCH: 27.9 pg (ref 26.0–34.0)
MCHC: 36.2 g/dL — ABNORMAL HIGH (ref 30.0–36.0)
MCV: 76.9 fL — AB (ref 78.0–100.0)
MONO ABS: 0.6 10*3/uL (ref 0.1–1.0)
MONOS PCT: 13 %
NEUTROS PCT: 57 %
Neutro Abs: 2.8 10*3/uL (ref 1.7–7.7)
PLATELETS: 211 10*3/uL (ref 150–400)
RBC: 4.63 MIL/uL (ref 3.87–5.11)
RDW: 14 % (ref 11.5–15.5)
WBC: 4.8 10*3/uL (ref 4.0–10.5)

## 2017-10-08 LAB — URINALYSIS, MICROSCOPIC (REFLEX)

## 2017-10-08 NOTE — ED Provider Notes (Signed)
[facility] EMERGENCY DEPARTMENT Provider Note   CSN: [id number] Arrival date & time: 10/08/17  0209     History   Chief Complaint Chief Complaint  Patient presents with  . numbness to feet    HPI Courtney Calhoun is a 57 y.o. female.  HPI   Patient is a 57 year old female past medical history significant for anxiety depression Guilbert syndrome hyperlipidemia and osteopenia.  Patient is presenting with a very odd description of symptoms.  She is reporting bilateral lower leg "heaviness".  Patient reports that she feels odd in the way that she sitting down.  She feels like she is "sitting on fluid".  Patient reports some difficulty stooling (constipation).  No lack of bowel or bladder.  No back pain.  Patient reports occasional numbness and tingling to both of her feet.  She reports the heaviness of the legs and feet is what drove her here today.  She reports that she was seen on the 31st the same thing at urgent care and they were not able to give an explanation.  No lack of bowel or bladder continence.  No hemorrhoids.  No fevers.  No IV drug use.  No upper extremity issues.  No headaches.  Past Medical History:  Diagnosis Date  . Anxiety   . Depression   . Gilbert's syndrome   . Hematuria   . Hyperlipidemia   . Osteopenia     Patient Active Problem List   Diagnosis Date Noted  . Bilateral leg edema 10/28/2015  . Unilateral osteoarthritis of first carpometacarpal (CMC) joint 10/15/2015  . Pain and swelling of elbow 10/15/2014  . Tachycardia 01/10/2013  . General medical examination 07/13/2011  . Hyperlipidemia 11/08/2010  . ALLERGIC RHINITIS 11/08/2010  . OSTEOPENIA 11/08/2010    Past Surgical History:  Procedure Laterality Date  . CHOLECYSTECTOMY    . EYE SURGERY    . INNER EAR SURGERY      OB History    No data available       Home Medications    Prior to Admission medications   Medication Sig Start Date End Date Taking? Authorizing Provider    atorvastatin (LIPITOR) 10 MG tablet TAKE 1 TABLET BY MOUTH  DAILY. 08/14/17   Midge Minium, MD  Calcium Carbonate-Vit D-Min (CALCIUM 1200) 1200-1000 MG-UNIT CHEW Chew by mouth daily. Reported on 09/29/2015    [provider]  cetirizine (ZYRTEC) 10 MG chewable tablet Chew 1 tablet (10 mg total) by mouth daily. 07/13/11   Midge Minium, MD  Cholecalciferol (VITAMIN D) 2000 UNITS tablet Take 2,000 Units by mouth daily.      [provider]  doxycycline (VIBRAMYCIN) 100 MG capsule Take 1 capsule (100 mg total) by mouth 2 (two) times daily. 10/02/17   Ivar Drape D, PA  latanoprost (XALATAN) 0.005 % ophthalmic solution Place 1 drop into both eyes at bedtime. 12/08/12   [provider]  Polyethyl Glycol-Propyl Glycol (SYSTANE OP) Apply to eye.    [provider]    Family History Family History  Problem Relation Age of Onset  . Hypertension Mother   . Cancer Maternal Aunt        colon  . Stroke Maternal Grandmother   . Hyperlipidemia Paternal Grandfather     Social History Social History   Tobacco Use  . Smoking status: Never Smoker  . Smokeless tobacco: Never Used  Substance Use Topics  . Alcohol use: No  . Drug use: No  Allergies   Aspirin and Ibuprofen   Review of Systems Review of Systems  Constitutional: Negative for activity change, fatigue and fever.  HENT: Negative for congestion.   Respiratory: Negative for shortness of breath.   Cardiovascular: Negative for chest pain.  Gastrointestinal: Negative for abdominal pain.  Genitourinary: Negative for difficulty urinating and dysuria.  Neurological: Positive for numbness. Negative for dizziness, weakness, light-headedness and headaches.  All other systems reviewed and are negative.    Physical Exam Updated Vital Signs BP 116/81 (BP Location: Right Arm)   Pulse 93   Temp 98.4 F (36.9 C) (Oral)   Resp 16   SpO2 100%   Physical Exam  Constitutional: She  is oriented to person, place, and time. She appears well-developed and well-nourished.  HENT:  Head: Normocephalic and atraumatic.  Eyes: Right eye exhibits no discharge.  Cardiovascular: Normal rate and regular rhythm.  Pulmonary/Chest: Effort normal and breath sounds normal. No respiratory distress.  Abdominal: Soft. Bowel sounds are normal. She exhibits no distension.  Genitourinary:  Genitourinary Comments: Normal rectal exam no hemorrhoids.  Sensation intact in the perineum.  Neurological: She is oriented to person, place, and time.  Equal strength bilaterally lower extremity's.  Sensation intact.  No evidence of any "fluid" or swelling in either of the lower extremities.  Skin: Skin is warm and dry. She is not diaphoretic.  Psychiatric: She has a normal mood and affect.  Nursing note and vitals reviewed.    ED Treatments / Results  Labs (all labs ordered are listed, but only abnormal results are displayed) Labs Reviewed  URINALYSIS, ROUTINE W REFLEX MICROSCOPIC - Abnormal; Notable for the following components:      Result Value   Hgb urine dipstick TRACE (*)    Ketones, ur 15 (*)    All other components within normal limits  URINALYSIS, MICROSCOPIC (REFLEX) - Abnormal; Notable for the following components:   Bacteria, UA FEW (*)    Squamous Epithelial / LPF 0-5 (*)    All other components within normal limits  COMPREHENSIVE METABOLIC PANEL  CBC WITH DIFFERENTIAL/PLATELET    EKG  EKG Interpretation None       Radiology No results found.  Procedures Procedures (including critical care time)  Medications Ordered in ED Medications - No data to display   Initial Impression / Assessment and Plan / ED Course  I have reviewed the triage vital signs and the nursing notes.  Pertinent labs & imaging results that were available during my care of the patient were reviewed by me and considered in my medical decision making (see chart for details).     Patient is a  57 year old female past medical history significant for anxiety depression Guilbert syndrome hyperlipidemia and osteopenia.  Patient is presenting with a very odd description of symptoms.  She is reporting bilateral lower leg "heaviness".  Patient reports that she feels odd in the way that she sitting down.  She feels like she is "sitting on fluid".  Patient reports some difficulty stooling (constipation).  No lack of bowel or bladder.  No back pain.  Patient reports occasional numbness and tingling to both of her feet.  She reports the heaviness of the legs and feet is what drove her here today.  She reports that she was seen on the 31st the same thing at urgent care and they were not able to give an explanation.  No lack of bowel or bladder continence.  No hemorrhoids.  No fevers.  No IV drug use.  No upper extremity issues.  No headaches.  5:58 AM Unsure what is causing patient's symptoms today.  They are very difficult to comprehend her description of the symptoms.  Patient exam is good.  Perineal sensation is intact.  Bilateral lower extremities are strong, with normal sensation.  We will get plain film of her L-spine to make sure there is no pathologic lesion.  We will get baseline labs.  Otherwise patient is to follow-up with her primary care tomorrow morning.  I told her she may need an MRI or further imaging but that I would defer to her primary care.  Final Clinical Impressions(s) / ED Diagnoses   Final diagnoses:  None    ED Discharge Orders    None       Macarthur Critchley, MD 10/11/17 2335

## 2017-10-08 NOTE — Discharge Instructions (Signed)
Please discuss your symptoms with your primary care physician.  We cannot find an emergent cause for them today.  However your primary care may want to do an MRI or further imaging.  Your x-ray was reassuring.  Your labs were all reassuring.  Your vital signs were normal.    Please continue to keep a close monitoring on your symptoms.

## 2017-10-08 NOTE — ED Triage Notes (Signed)
Pt c/o numbness to bilateral feet. States symptoms since Christmas. States she was seen at an Urgent Care for same. C/o tingling to both legs, but right side is worse that comes and goes. Denies any lower back. Denies any urinary symptoms. Denies any other concerns. States she is not a diabetic.

## 2017-10-09 ENCOUNTER — Ambulatory Visit: Payer: 59 | Admitting: Family Medicine

## 2017-10-09 ENCOUNTER — Encounter: Payer: Self-pay | Admitting: Family Medicine

## 2017-10-09 VITALS — BP 126/80 | HR 93 | Temp 98.3°F | Ht 66.0 in | Wt 154.0 lb

## 2017-10-09 DIAGNOSIS — R202 Paresthesia of skin: Secondary | ICD-10-CM

## 2017-10-09 LAB — TSH: TSH: 1.66 u[IU]/mL (ref 0.35–4.50)

## 2017-10-09 LAB — VITAMIN B12: Vitamin B-12: >1500 pg/mL — ABNORMAL HIGH (ref 211–911)

## 2017-10-09 NOTE — Progress Notes (Signed)
Subjective  CC:  Chief Complaint  Patient presents with  . Heaviness in body from the waist down    x 2 weeks  . Gait Problem    HPI: Courtney Calhoun is a 57 y.o. female who presents to the office today to address the problems listed above in the chief complaint. I've personally reviewed recent office visit notes, hospital notes, associated labs and imaging reports and/or pertinent outside office records via chart review or CareEverywhere. Briefly, this is the fourth visit for patient who explains symptoms of heaviness and fullness from the waist down.  She initially felt pressure and thought she had a urinary tract infection: She was evaluated by her gynecologist and treated for UTI although the culture came back negative.  She then reports she had numbness and tingling in her feet that she thought may be related to the antibiotics.  This all was approximately 3-4 weeks ago.  Then around Christmas, she had worsening sensation of a fullness in her buttocks, heaviness in both legs with occasional numbness or tingling in the feet.  She reports her legs feel heavy and it makes it difficult for her to walk.  She feels that when she sits down it is like she is sitting on a balloon.  She denies pain, low back pain, radicular symptoms, rash, fevers or associated systemic symptoms.  She was evaluated first at the urgent care for the symptoms on 27 December, and then again yesterday at the ER.  Workup there found normal neurologic exams, normal laboratory testing.  She was given doxycycline at the urgent care for unclear reasons but this made no change in her symptoms.  She reports that she feels fluid in her buttocks.  She is on Lipitor but has been on this for about a year.  She denies true myalgias.  Vitamin D levels have been normal.  She is not anemic but she did have microcytosis on labs.  She denies stressors.  She recently traveled to help and had over the holiday and pushed herself to walk and did  well but just does not feel herself.  Of note she is gained about 10 pounds in the last year since having her braces off. I reviewed the patients updated PMH, FH, and SocHx.    Wt Readings from Last 3 Encounters:  10/09/17 154 lb (69.9 kg)  10/02/17 157 lb (71.2 kg)  11/07/16 149 lb (67.6 kg)    Patient Active Problem List   Diagnosis Date Noted  . Bilateral leg edema 10/28/2015  . Unilateral osteoarthritis of first carpometacarpal (CMC) joint 10/15/2015  . Pain and swelling of elbow 10/15/2014  . Tachycardia 01/10/2013  . General medical examination 07/13/2011  . Hyperlipidemia 11/08/2010  . ALLERGIC RHINITIS 11/08/2010  . OSTEOPENIA 11/08/2010   Current Meds  Medication Sig  . atorvastatin (LIPITOR) 10 MG tablet TAKE 1 TABLET BY MOUTH  DAILY.  . Calcium Carbonate-Vit D-Min (CALCIUM 1200) 1200-1000 MG-UNIT CHEW Chew by mouth daily. Reported on 09/29/2015  . cetirizine (ZYRTEC) 10 MG chewable tablet Chew 1 tablet (10 mg total) by mouth daily.  . Cholecalciferol (VITAMIN D) 2000 UNITS tablet Take 2,000 Units by mouth daily.    [name] latanoprost (XALATAN) 0.005 % ophthalmic solution Place 1 drop into both eyes at bedtime.  Vladimir Faster Glycol-Propyl Glycol (SYSTANE OP) Apply to eye.    Allergies: Patient is allergic to aspirin and ibuprofen. Family History: Patient family history includes Cancer in her maternal aunt; Hyperlipidemia in her paternal grandfather;  Hypertension in her mother; Stroke in her maternal grandmother. Social History:  Patient  reports that  has never smoked. she has never used smokeless tobacco. She reports that she does not drink alcohol or use drugs.  Review of Systems: Constitutional: Negative for fever malaise or anorexia Cardiovascular: negative for chest pain Respiratory: negative for SOB or persistent cough Gastrointestinal: negative for abdominal pain  Objective  Vitals: BP 126/80 (BP Location: Left Arm, Patient Position: Sitting, Cuff Size: Normal)    Pulse 93   Temp 98.3 F (36.8 C) (Oral)   Ht 5\' 6"  (1.676 m)   Wt 154 lb (69.9 kg)   SpO2 97%   BMI 24.86 kg/m  General: no acute distress , A&Ox3, sits cautiously, moves slowly HEENT: PEERL, conjunctiva normal, Oropharynx moist,neck is supple Cardiovascular:  RRR without murmur or gallop.  Respiratory:  Good breath sounds bilaterally, CTAB with normal respiratory effort Skin:  Warm, no rashes , she points to her buttocks where she is feeling the fluid however on exam this is normal subcutaneous fat.  No masses palpated. Musculoskeletal: 5 out of 5 bilateral lower extremity strength, can walk on toes and heels, can do a deep knee squat Neuro: Normal DTRs, negative Romberg, able to walk tandem gait  Assessment  1. Paresthesia of bilateral legs      Plan   Paresthesias: Normal exam findings and recent laboratory studies are normal.  Unclear etiology.  Given patient's persistent symptoms recommend evaluation for neuropathy with nerve conduction studies and lab work.  Follow-up with PCP in 1-2 weeks for further evaluation and treatment options.  Consider neurology eval if abnormal.  Reassured that there is no edema or swelling noted in her lower extremities or buttocks.  Follow up: Return in about 2 weeks (around 10/23/2017) for recheck.    Commons side effects, risks, benefits, and alternatives for medications and treatment plan prescribed today were discussed, and the patient expressed understanding of the given instructions. Patient is instructed to call or message via MyChart if he/she has any questions or concerns regarding our treatment plan. No barriers to understanding were identified. We discussed Red Flag symptoms and signs in detail. Patient expressed understanding regarding what to do in case of urgent or emergency type symptoms.   Medication list was reconciled, printed and provided to the patient in AVS. Patient instructions and summary information was reviewed with the  patient as documented in the AVS. This note was prepared with assistance of Dragon voice recognition software. Occasional wrong-word or sound-a-like substitutions may have occurred due to the inherent limitations of voice recognition software  Orders Placed This Encounter  Procedures  . TSH  . Vitamin B12  . Folate RBC  . NCV with EMG(electromyography)   No orders of the defined types were placed in this encounter.

## 2017-10-09 NOTE — Patient Instructions (Signed)
Please return in 1-2 weeks with Dr. Birdie Riddle to recheck your symptoms We will call you with information regarding your referral appointment. Nerve conduction studies I will release your lab results to you on your MyChart account with further instructions. Please reply with any questions.    If you have any questions or concerns, please don't hesitate to send me a message via MyChart or call the office at [phone]. Thank you for visiting with [place] today! It's our pleasure caring for you.

## 2017-10-10 LAB — FOLATE RBC: RBC Folate: 716 ng/mL RBC (ref >280)

## 2017-10-11 ENCOUNTER — Encounter: Payer: Self-pay | Admitting: Family Medicine

## 2017-10-12 ENCOUNTER — Other Ambulatory Visit: Payer: Self-pay

## 2017-10-12 ENCOUNTER — Ambulatory Visit: Payer: 59 | Admitting: Family Medicine

## 2017-10-12 ENCOUNTER — Encounter: Payer: Self-pay | Admitting: Gastroenterology

## 2017-10-12 ENCOUNTER — Encounter: Payer: Self-pay | Admitting: Family Medicine

## 2017-10-12 VITALS — BP 110/81 | HR 80 | Temp 98.6°F | Resp 16 | Ht 66.0 in | Wt 151.4 lb

## 2017-10-12 DIAGNOSIS — K6289 Other specified diseases of anus and rectum: Secondary | ICD-10-CM | POA: Diagnosis not present

## 2017-10-12 LAB — HEMOCCULT GUIAC POC 1CARD (OFFICE): FECAL OCCULT BLD: NEGATIVE

## 2017-10-12 MED ORDER — HYDROCORTISONE ACETATE 25 MG RE SUPP: 25.0000 mg | Freq: Two times a day (BID) | RECTAL | 0 refills | Status: DC

## 2017-10-12 NOTE — Progress Notes (Signed)
   Subjective:    Patient ID: Courtney Calhoun, female    DOB: 1960/12/07, 57 y.o.   MRN: [id number]  HPI Rectal pain- 'I feel like something is pushing into my rectum'.  sxs started 'a couple of weeks ago' and are worsening.  Uncomfortable to sit.  No blood in stool or when using the restroom.  Will 'sometimes' have to strain when having a BM.  No hx of hemorrhoids.  No burning or itching of rectum.  BMs are still regular- not painful.  Pt reports it feels like 'i'm sitting on something' and can 'sometimes feel it when i'm walking'.   Review of Systems For ROS see HPI     Objective:   Physical Exam  Constitutional: She is oriented to person, place, and time. She appears well-developed and well-nourished.  Obviously uncomfortable when sitting  Genitourinary: Rectal exam shows tenderness (no obvious mass felt but pt very uncomfortable during rectal exam). Rectal exam shows no external hemorrhoid, no fissure, no mass, anal tone normal and guaiac negative stool.  Genitourinary Comments: Sensation of perineum intact  Neurological: She is alert and oriented to person, place, and time.  Skin: Skin is warm and dry.  Psychiatric:  Flat affect  Vitals reviewed.         Assessment & Plan:  Rectal pain- new to provider, ongoing for pt.  Reviewed ER visit from 1/6 and OV from 1/7.  Rectal exam today correlates w/ rectal exam from 1/6 in ER.  No obvious masses or abnormal sphincter tone.  Pt is vague regarding the time frame of sxs, 'a while' and only answers 'a few weeks' when pressed.  No changes in BMs w/ exception of intermittent straining.  Given unremarkable exam will start hydrocortisone suppositories in case of internal hemorrhoid that I can't reach and refer to GI for additional work.  Reviewed supportive care and red flags that should prompt return.  Pt expressed understanding and is in agreement w/ plan.

## 2017-10-12 NOTE — Patient Instructions (Signed)
Follow up as needed or as scheduled Use the Hydrocortisone suppositories as directed to help w/ rectal pain We'll call you with your GI appt for the rectal pain It appears that Neurology called you around 10:30 this morning and left a voicemail Call with any questions or concerns Hang in there!

## 2017-10-16 ENCOUNTER — Encounter: Payer: Self-pay | Admitting: Family Medicine

## 2017-10-18 DIAGNOSIS — N760 Acute vaginitis: Secondary | ICD-10-CM | POA: Diagnosis not present

## 2017-10-18 DIAGNOSIS — R102 Pelvic and perineal pain: Secondary | ICD-10-CM | POA: Diagnosis not present

## 2017-10-19 ENCOUNTER — Encounter: Payer: 59 | Admitting: Diagnostic Neuroimaging

## 2017-10-20 ENCOUNTER — Ambulatory Visit: Payer: 59 | Admitting: Family Medicine

## 2017-10-23 ENCOUNTER — Encounter: Payer: Self-pay | Admitting: Family Medicine

## 2017-10-23 ENCOUNTER — Ambulatory Visit: Payer: 59 | Admitting: Family Medicine

## 2017-10-23 ENCOUNTER — Other Ambulatory Visit: Payer: Self-pay

## 2017-10-23 VITALS — BP 122/81 | HR 78 | Temp 98.0°F | Resp 16 | Ht 66.0 in | Wt 149.0 lb

## 2017-10-23 DIAGNOSIS — E785 Hyperlipidemia, unspecified: Secondary | ICD-10-CM

## 2017-10-23 DIAGNOSIS — R600 Localized edema: Secondary | ICD-10-CM | POA: Diagnosis not present

## 2017-10-23 LAB — BASIC METABOLIC PANEL
BUN: 10 mg/dL (ref 6–23)
CHLORIDE: 103 meq/L (ref 96–112)
CO2: 27 mEq/L (ref 19–32)
CREATININE: 0.81 mg/dL (ref 0.40–1.20)
Calcium: 9.4 mg/dL (ref 8.4–10.5)
GFR: 93.93 mL/min (ref >60.00)
Glucose, Bld: 89 mg/dL (ref 70–99)
POTASSIUM: 4.1 meq/L (ref 3.5–5.1)
Sodium: 137 mEq/L (ref 135–145)

## 2017-10-23 LAB — CBC WITH DIFFERENTIAL/PLATELET
BASOS PCT: 0.3 % (ref 0.0–3.0)
Basophils Absolute: 0 10*3/uL (ref 0.0–0.1)
EOS PCT: 1.1 % (ref 0.0–5.0)
Eosinophils Absolute: 0 10*3/uL (ref 0.0–0.7)
HEMATOCRIT: 41 % (ref 36.0–46.0)
HEMOGLOBIN: 13.9 g/dL (ref 12.0–15.0)
Lymphocytes Relative: 38.7 % (ref 12.0–46.0)
Lymphs Abs: 1.6 10*3/uL (ref 0.7–4.0)
MCHC: 33.8 g/dL (ref 30.0–36.0)
MCV: 82.3 fl (ref 78.0–100.0)
MONO ABS: 0.4 10*3/uL (ref 0.1–1.0)
MONOS PCT: 9.9 % (ref 3.0–12.0)
Neutro Abs: 2.1 10*3/uL (ref 1.4–7.7)
Neutrophils Relative %: 50 % (ref 43.0–77.0)
Platelets: 185 10*3/uL (ref 150.0–400.0)
RBC: 4.98 Mil/uL (ref 3.87–5.11)
RDW: 15.2 % (ref 11.5–15.5)
WBC: 4.1 10*3/uL (ref 4.0–10.5)

## 2017-10-23 LAB — HEPATIC FUNCTION PANEL
ALBUMIN: 4.1 g/dL (ref 3.5–5.2)
ALT: 9 U/L (ref 0–35)
AST: 12 U/L (ref 0–37)
Alkaline Phosphatase: 47 U/L (ref 39–117)
BILIRUBIN TOTAL: 1.3 mg/dL — AB (ref 0.2–1.2)
Bilirubin, Direct: 0.2 mg/dL (ref 0.0–0.3)
Total Protein: 6.8 g/dL (ref 6.0–8.3)

## 2017-10-23 LAB — LIPID PANEL
CHOL/HDL RATIO: 3
Cholesterol: 226 mg/dL — ABNORMAL HIGH (ref 0–200)
HDL: 76.6 mg/dL (ref >39.00)
LDL CALC: 139 mg/dL — AB (ref 0–99)
NONHDL: 149
Triglycerides: 52 mg/dL (ref 0.0–149.0)
VLDL: 10.4 mg/dL (ref 0.0–40.0)

## 2017-10-23 LAB — TSH: TSH: 1.24 u[IU]/mL (ref 0.35–4.50)

## 2017-10-23 MED ORDER — FUROSEMIDE 20 MG PO TABS: 20.0000 mg | ORAL_TABLET | Freq: Every day | ORAL | 3 refills | Status: DC

## 2017-10-23 NOTE — Assessment & Plan Note (Signed)
No evidence of swelling on exam today.  She shared this concern 2 yrs ago and no swelling was seen at that time either.  Reviewed need for low salt diet- pt reports she eats a high salt diet.  Encouraged increased water intake.  Start Lasix prn.  Will follow.

## 2017-10-23 NOTE — Patient Instructions (Signed)
Follow up as needed or as scheduled for your physical next month We'll notify you of your lab results and make any changes if needed Start the Furosemide (water pill) as needed for swelling and leg tightness Increase your water intake Limit your salt intake- soups, frozen dinners, deli meat, sausage, bacon, ham, etc Call with any questions or concerns Hang in there!!!

## 2017-10-23 NOTE — Assessment & Plan Note (Signed)
Chronic problem.  Pt stopped her Lipitor 2 weeks ago at the request of Dr Jonni Sanger when she was complaining of leg heaviness.  Check labs and restart the Lipitor if needed.  Pt expressed understanding and is in agreement w/ plan.

## 2017-10-23 NOTE — Progress Notes (Signed)
   Subjective:    Patient ID: Courtney Calhoun, female    DOB: 08-11-1961, 57 y.o.   MRN: [id number]  HPI Hyperlipidemia- chronic problem, pt stopped her Lipitor when she saw Dr Jonni Sanger 2 weeks ago.  Due for repeat labs.  Stopping the Lipitor did not improve her feeling of leg heaviness.  Leg swelling- pt reports that legs feel 'tight'.  She is wearing compression socks.  sxs are the worst when first waking/standing and improves w/ movement.  Pt is eating a high salt diet.   Review of Systems For ROS see HPI     Objective:   Physical Exam  Constitutional: She is oriented to person, place, and time. She appears well-developed and well-nourished. No distress.  HENT:  Head: Normocephalic and atraumatic.  Eyes: Conjunctivae and EOM are normal. Pupils are equal, round, and reactive to light.  Neck: Normal range of motion. Neck supple. No thyromegaly present.  Cardiovascular: Normal rate, regular rhythm, normal heart sounds and intact distal pulses.  No murmur heard. Pulmonary/Chest: Effort normal and breath sounds normal. No respiratory distress.  Abdominal: Soft. She exhibits no distension. There is no tenderness.  Musculoskeletal: She exhibits no edema (no swelling of either lower extremity noted on exam).  Lymphadenopathy:    She has no cervical adenopathy.  Neurological: She is alert and oriented to person, place, and time.  Skin: Skin is warm and dry.  Psychiatric: She has a normal mood and affect. Her behavior is normal.  Vitals reviewed.         Assessment & Plan:

## 2017-11-09 ENCOUNTER — Other Ambulatory Visit: Payer: Self-pay

## 2017-11-09 ENCOUNTER — Ambulatory Visit (INDEPENDENT_AMBULATORY_CARE_PROVIDER_SITE_OTHER): Payer: 59 | Admitting: Family Medicine

## 2017-11-09 ENCOUNTER — Encounter: Payer: Self-pay | Admitting: Family Medicine

## 2017-11-09 VITALS — BP 120/78 | HR 72 | Temp 98.3°F | Resp 16 | Ht 66.0 in | Wt 146.2 lb

## 2017-11-09 DIAGNOSIS — Z0000 Encounter for general adult medical examination without abnormal findings: Secondary | ICD-10-CM

## 2017-11-09 NOTE — Assessment & Plan Note (Signed)
Pt's PE WNL.  UTD on GYN, colonoscopy (due next year) and immunizations.  Reviewed recent labs- no need to repeat.  Anticipatory guidance provided.

## 2017-11-09 NOTE — Patient Instructions (Signed)
Follow up in 4-6 months to recheck cholesterol Continue to work on healthy diet and regular exercise- you can do it! No need for cholesterol medication at this time but we'll keep an eye on it Call with any questions or concerns Happy Valentine's Day!

## 2017-11-09 NOTE — Progress Notes (Signed)
   Subjective:    Patient ID: Courtney Calhoun, female    DOB: 09-Aug-1961, 57 y.o.   MRN: [id number]  HPI CPE- UTD on GYN ([facility]), colonoscopy, mammo, immunizations.   Review of Systems Patient reports no vision/ hearing changes, adenopathy,fever, weight change,  persistant/recurrent hoarseness , swallowing issues, chest pain, palpitations, edema, persistant/recurrent cough, hemoptysis, dyspnea (rest/exertional/paroxysmal nocturnal), gastrointestinal bleeding (melena, rectal bleeding), abdominal pain, significant heartburn, bowel changes, GU symptoms (dysuria, hematuria, incontinence), Gyn symptoms (abnormal  bleeding, pain),  syncope, focal weakness, memory loss, numbness & tingling, skin/hair/nail changes, abnormal bruising or bleeding, anxiety, or depression.     Objective:   Physical Exam General Appearance:    Alert, cooperative, no distress, appears stated age  Head:    Normocephalic, without obvious abnormality, atraumatic  Eyes:    PERRL, conjunctiva/corneas clear, EOM's intact, fundi    benign, both eyes  Ears:    Normal TM's and external ear canals, both ears  Nose:   Nares normal, septum midline, mucosa normal, no drainage    or sinus tenderness  Throat:   Lips, mucosa, and tongue normal; teeth and gums normal  Neck:   Supple, symmetrical, trachea midline, no adenopathy;    Thyroid: no enlargement/tenderness/nodules  Back:     Symmetric, no curvature, ROM normal, no CVA tenderness  Lungs:     Clear to auscultation bilaterally, respirations unlabored  Chest Wall:    No tenderness or deformity   Heart:    Regular rate and rhythm, S1 and S2 normal, no murmur, rub   or gallop  Breast Exam:    Deferred to GYN  Abdomen:     Soft, non-tender, bowel sounds active all four quadrants,    no masses, no organomegaly  Genitalia:    Deferred to GYN  Rectal:    Extremities:   Extremities normal, atraumatic, no cyanosis or edema  Pulses:   2+ and symmetric all extremities    Skin:   Skin color, texture, turgor normal, no rashes or lesions  Lymph nodes:   Cervical, supraclavicular, and axillary nodes normal  Neurologic:   CNII-XII intact, normal strength, sensation and reflexes    throughout          Assessment & Plan:

## 2017-11-22 ENCOUNTER — Encounter: Payer: Self-pay | Admitting: Gastroenterology

## 2017-11-22 ENCOUNTER — Ambulatory Visit: Payer: 59 | Admitting: Gastroenterology

## 2017-11-22 VITALS — BP 102/68 | HR 72 | Ht 66.0 in | Wt 146.4 lb

## 2017-11-22 DIAGNOSIS — R1030 Lower abdominal pain, unspecified: Secondary | ICD-10-CM

## 2017-11-22 DIAGNOSIS — K6289 Other specified diseases of anus and rectum: Secondary | ICD-10-CM

## 2017-11-22 DIAGNOSIS — R102 Pelvic and perineal pain: Secondary | ICD-10-CM

## 2017-11-22 NOTE — Patient Instructions (Signed)
You have been scheduled for a CT scan of the abdomen and pelvis at [facility] ([address]---this is in the same building as [facility]).   You are scheduled on 11/28/2017 at 1 pm . You should arrive 15 minutes prior to your appointment time for registration. Please follow the written instructions below on the day of your exam:  WARNING: IF YOU ARE ALLERGIC TO IODINE/X-RAY DYE, PLEASE NOTIFY RADIOLOGY IMMEDIATELY AT [phone]! YOU WILL BE GIVEN A 13 HOUR PREMEDICATION PREP.  1) Do not eat or drink anything after 9am(4 hours prior to your test) 2) You have been given 2 bottles of oral contrast to drink. The solution may taste               better if refrigerated, but do NOT add ice or any other liquid to this solution. Shake             well before drinking.    Drink 1 bottle of contrast @ 11am (2 hours prior to your exam)  Drink 1 bottle of contrast @ 12pm (1 hour prior to your exam)  You may take any medications as prescribed with a small amount of water except for the following: Metformin, Glucophage, Glucovance, Avandamet, Riomet, Fortamet, Actoplus Met, Janumet, Glumetza or Metaglip. The above medications must be held the day of the exam AND 48 hours after the exam.  The purpose of you drinking the oral contrast is to aid in the visualization of your intestinal tract. The contrast solution may cause some diarrhea. Before your exam is started, you will be given a small amount of fluid to drink. Depending on your individual set of symptoms, you may also receive an intravenous injection of x-ray contrast/dye. Plan on being at [facility] for 30 minutes or longer, depending on the type of exam you are having performed.  This test typically takes 30-45 minutes to complete.  If you have any questions regarding your exam or if you need to reschedule, you may call the CT department at [phone] between the hours of 8:00 am and 5:00 pm, Monday-Friday.   If CT is  Negative we will refer you to Pelvic floor Physical Therapy  ________________________________________________________________________

## 2017-11-22 NOTE — Progress Notes (Signed)
Courtney Calhoun    [id number]    02-04-1961  Primary Care Physician:Tabori, Aundra Millet, MD  Referring Physician: Midge Minium, MD [address] [place] [address] [place], [place] [address]  Chief complaint: Rectal discomfort , RLQ pain  HPI: 8 yr F here with complaints of rectal pain radiating to her pelvis and right lower abdomen.  Denies any trauma.  She has constant rectal discomfort, uncomfortable to sit for prolonged period of time progressively worse since Christmas.  She has pain radiating into the right hip, right lower side feels heavy.  She also has cramping in the pelvic area.  Denies any rectal bleeding or mucus discharge.  No change in bowel habits but has more discomfort during bowel movement which improves after defecation to some degree but does not completely go away.  She exercises and does yoga on a regular basis, denies any recent changes in her exercise routine.  Denies lifting any heavy weight.  No constipation or diarrhea. Colonoscopy January 08, 2009 by Dr. Maurene Capes was normal except for internal hemorrhoids.  Her maternal aunt had colon cancer but no other additional family members with history of colon cancer  Outpatient Encounter Medications as of 11/22/2017  Medication Sig  . Calcium Carbonate-Vit D-Min (CALCIUM 1200) 1200-1000 MG-UNIT CHEW Chew by mouth daily. Reported on 09/29/2015  . cetirizine (ZYRTEC) 10 MG chewable tablet Chew 1 tablet (10 mg total) by mouth daily.  . furosemide (LASIX) 20 MG tablet Take 1 tablet (20 mg total) by mouth daily.  [name] latanoprost (XALATAN) 0.005 % ophthalmic solution Place 1 drop into both eyes at bedtime.  Vladimir Faster Glycol-Propyl Glycol (SYSTANE OP) Apply to eye.  . Vitamin D, Ergocalciferol, (DRISDOL) 50000 units CAPS capsule Take 50,000 Units by mouth daily.  . [DISCONTINUED] Cholecalciferol (VITAMIN D) 2000 UNITS tablet Take 5,000 Units by mouth daily.    No facility-administered encounter medications on file as of  11/22/2017.     Allergies as of 11/22/2017 - Review Complete 11/22/2017  Allergen Reaction Noted  . Aspirin  12/25/2008  . Ibuprofen  12/25/2008    Past Medical History:  Diagnosis Date  . Anxiety   . Depression   . Gilbert's syndrome   . Glaucoma   . Hematuria   . Hyperlipidemia   . Osteopenia     Past Surgical History:  Procedure Laterality Date  . CHOLECYSTECTOMY    . EYE SURGERY    . INNER EAR SURGERY      Family History  Problem Relation Age of Onset  . Hypertension Mother   . Cancer Maternal Aunt        colon  . Stroke Maternal Grandmother   . Hyperlipidemia Paternal Grandfather     Social History   Socioeconomic History  . Marital status: Single    Spouse name: Not on file  . Number of children: 0  . Years of education: Not on file  . Highest education level: Not on file  Social Needs  . Financial resource strain: Not on file  . Food insecurity - worry: Not on file  . Food insecurity - inability: Not on file  . Transportation needs - medical: Not on file  . Transportation needs - non-medical: Not on file  Occupational History  . Occupation: Product/process development scientist for [employer]  Tobacco Use  . Smoking status: Never Smoker  . Smokeless tobacco: Never Used  Substance and Sexual Activity  . Alcohol use: No  . Drug  use: No  . Sexual activity: Not on file  Other Topics Concern  . Not on file  Social History Narrative  . Not on file      Review of systems: Review of Systems  Constitutional: Negative for fever and chills.  HENT: Negative.   Eyes: Negative for blurred vision.  Respiratory: Negative for cough, shortness of breath and wheezing.   Cardiovascular: Negative for chest pain and palpitations.  Gastrointestinal: as per HPI Genitourinary: Negative for dysuria, urgency, frequency and hematuria.  Musculoskeletal: Negative for myalgias, back pain and joint pain.  Skin: Negative for itching and rash.  Neurological: Negative for dizziness, tremors, focal  weakness, seizures and loss of consciousness.  Endo/Heme/Allergies: Positive for seasonal allergies.  Psychiatric/Behavioral: Negative for depression, suicidal ideas and hallucinations.  All other systems reviewed and are negative.   Physical Exam: Vitals:   11/22/17 1407  BP: 102/68  Pulse: 72   Body mass index is 23.63 kg/m. Gen:      No acute distress HEENT:  EOMI, sclera anicteric Neck:     No masses; no thyromegaly Lungs:    Clear to auscultation bilaterally; normal respiratory effort CV:         Regular rate and rhythm; no murmurs Abd:      + bowel sounds; soft, non-tender; no palpable masses, no distension Ext:    No edema; adequate peripheral perfusion Skin:      Warm and dry; no rash Neuro: alert and oriented x 3 Psych: normal mood and affect Rectal exam: Normal anal sphincter tone, no anal fissure or external hemorrhoids Anoscopy: Small internal hemorrhoids, no active bleeding, normal dentate line, no visible nodules  Data Reviewed:  Reviewed labs, radiology imaging, old records and pertinent past GI work up   Assessment and Plan/Recommendations:  57 year old female here with complaints of rectal discomfort radiating to her right lower quadrant and cramping in the lower abdomen and pelvic area Obtain CT abdomen pelvis to exclude any acute pathology No evidence of anal fissure on rectal exam and no palpable nodules Possible levator ani spasm/ proctalgia; if CT abdomen pelvis unremarkable will consider physical therapy and rectal nitroglycerin , if continues to have persistent symptoms   K. Denzil Magnuson , MD [phone] Mon-Fri 8a-5p [phone] after 5p, weekends, holidays  CC: Midge Minium, MD

## 2017-11-24 ENCOUNTER — Other Ambulatory Visit: Payer: Self-pay | Admitting: Family Medicine

## 2017-11-26 ENCOUNTER — Encounter: Payer: Self-pay | Admitting: Family Medicine

## 2017-11-28 ENCOUNTER — Ambulatory Visit (INDEPENDENT_AMBULATORY_CARE_PROVIDER_SITE_OTHER)
Admission: RE | Admit: 2017-11-28 | Discharge: 2017-11-28 | Disposition: A | Discharge status: Home / Self Care | Payer: 59 | Source: Ambulatory Visit | Attending: Gastroenterology | Admitting: Gastroenterology

## 2017-11-28 DIAGNOSIS — K6289 Other specified diseases of anus and rectum: Secondary | ICD-10-CM

## 2017-11-28 DIAGNOSIS — R102 Pelvic and perineal pain unspecified side: Secondary | ICD-10-CM

## 2017-11-28 DIAGNOSIS — R1030 Lower abdominal pain, unspecified: Secondary | ICD-10-CM

## 2017-11-28 DIAGNOSIS — D259 Leiomyoma of uterus, unspecified: Secondary | ICD-10-CM | POA: Diagnosis not present

## 2017-11-28 MED ORDER — IOPAMIDOL (ISOVUE-300) INJECTION 61%
100.0000 mL | Freq: Once | INTRAVENOUS | Status: AC | PRN
  Administered 2017-11-28: 100 mL via INTRAVENOUS

## 2017-11-30 ENCOUNTER — Other Ambulatory Visit: Payer: Self-pay

## 2017-11-30 MED ORDER — AMBULATORY NON FORMULARY MEDICATION: 0.1250 mg | Freq: Two times a day (BID) | 1 refills | Status: DC

## 2017-12-07 ENCOUNTER — Encounter: Payer: Self-pay | Admitting: Neurology

## 2017-12-07 ENCOUNTER — Ambulatory Visit: Payer: 59 | Admitting: Neurology

## 2017-12-07 VITALS — BP 122/71 | HR 71 | Ht 66.0 in | Wt 151.6 lb

## 2017-12-07 DIAGNOSIS — M7918 Myalgia, other site: Secondary | ICD-10-CM

## 2017-12-07 DIAGNOSIS — R29898 Other symptoms and signs involving the musculoskeletal system: Secondary | ICD-10-CM

## 2017-12-07 DIAGNOSIS — M79651 Pain in right thigh: Secondary | ICD-10-CM

## 2017-12-07 NOTE — Progress Notes (Signed)
[facility]    Provider:  Dr Jaynee Eagles Referring Provider: Midge Minium, MD Primary Care Physician:  Midge Minium, MD  CC:  paresthesias  HPI:  Courtney Calhoun is a 57 y.o. female here as a referral from Dr. Birdie Riddle for paresthesias of the lower extremities.  Past medical history osteoarthritis, bilateral leg edema, tachycardia, hyperlipidemia, osteopenia.  She feels like she is sitting on something in the right buttocks. No pain but uncomfortable like there is a ball at her buttocks. Her right leg is heavy, not the left leg. Both her feet feel funny like she has gravel in her shoes. No symptoms above the waist. Feels weakness. Started a little before Christmas, no change in medication and no falls, she eas in her usual state of health. No low back pain, does not shoot into the leg. No numbness in the leg. She does has some numbness in the feet. No FHx of neuropathy.   Right leg paresthesias  Reviewed notes, labs and imaging from outside physicians, which showed:  Reviewed referring physician notes.  Patient has seen her primary care doctor for symptoms of heaviness and fullness from the waist down, she initially felt pressure and thought she had a urinary tract infection she was evaluated by her gynecologist and treated for UTI although it came back negative.  She also reported numbness and tingling in her feet that she thought may be related to antibiotics.  Then around Christmas time she had worsening sensation of a fullness in her buttocks, heaviness in both legs with occasional numbness and tingling in the feet, feels her legs feel heavy and it makes it difficult for her to walk.  When she sits down it like she is sitting on a balloon.  She denies pain, low back pain, radicular symptoms, rash, fevers or associated systemic symptoms.  She has been to the urgent care as well as the emergency room.  Workups showed normal neurologic exams, normal laboratory testing.   Vitamin D levels have been normal.  She is on lipid therapy she has been on this for about a year.  She denies stressors.  She is gained about 10 pounds in the last year. Symptoms are   Orders placed include TSH, B12 which were unremarkable.   MRI lumbar spine, reviewed images and agree with following: Broad-based levo scoliotic curvature of the lumbar spine. Vertebral body heights are normal. There is no listhesis. The posterior elements are intact. Disc spaces are preserved. No fracture. Sacroiliac joints are symmetric and normal.  IMPRESSION: Mild broad-based levo scoliotic curvature. Otherwise unremarkable lumbar spine radiographs.  Review of Systems: Patient complains of symptoms per HPI as well as the following symptoms: paresthesias. Pertinent negatives and positives per HPI. All others negative.   Social History   Socioeconomic History  . Marital status: Single    Spouse name: Not on file  . Number of children: 0  . Years of education: Not on file  . Highest education level: Not on file  Social Needs  . Financial resource strain: Not on file  . Food insecurity - worry: Not on file  . Food insecurity - inability: Not on file  . Transportation needs - medical: Not on file  . Transportation needs - non-medical: Not on file  Occupational History  . Occupation: Product/process development scientist for [employer]  Tobacco Use  . Smoking status: Never Smoker  . Smokeless tobacco: Never Used  Substance and Sexual Activity  . Alcohol use: No  . Drug use:  No  . Sexual activity: Not on file  Other Topics Concern  . Not on file  Social History Narrative  . Not on file    Family History  Problem Relation Age of Onset  . Hypertension Mother   . Cancer Maternal Aunt        colon  . Stroke Maternal Grandmother   . Hyperlipidemia Paternal Grandfather     Past Medical History:  Diagnosis Date  . Anxiety   . Depression   . Gilbert's syndrome   . Glaucoma   . Hematuria   . Hyperlipidemia   .  Osteopenia     Past Surgical History:  Procedure Laterality Date  . CHOLECYSTECTOMY    . EYE SURGERY    . INNER EAR SURGERY      Current Outpatient Medications  Medication Sig Dispense Refill  . AMBULATORY NON FORMULARY MEDICATION Place 0.125 mg rectally 2 (two) times daily. Medication Name: Nitroglycerin ointment 0.125% Pea size amount applied intra-rectally twice daily for 4 to 6 weeks 32 g 1  . cetirizine (ZYRTEC) 10 MG chewable tablet Chew 1 tablet (10 mg total) by mouth daily. 90 tablet 3  . latanoprost (XALATAN) 0.005 % ophthalmic solution Place 1 drop into both eyes at bedtime.    Vladimir Faster Glycol-Propyl Glycol (SYSTANE OP) Apply to eye.     No current facility-administered medications for this visit.     Allergies as of 12/07/2017 - Review Complete 12/07/2017  Allergen Reaction Noted  . Aspirin  12/25/2008  . Ibuprofen  12/25/2008    Vitals: BP 122/71   Pulse 71   Ht 5\' 6"  (1.676 m)   Wt 151 lb 9.6 oz (68.8 kg)   BMI 24.47 kg/m  Last Weight:  Wt Readings from Last 1 Encounters:  12/07/17 151 lb 9.6 oz (68.8 kg)   Last Height:   Ht Readings from Last 1 Encounters:  12/07/17 5\' 6"  (1.676 m)   Physical exam: Exam: Gen: NAD, conversant, well nourised, well groomed                     CV: RRR, no MRG. No Carotid Bruits. No peripheral edema, warm, nontender Eyes: Conjunctivae clear without exudates or hemorrhage  Neuro: Detailed Neurologic Exam  Speech:    Speech is normal; fluent and spontaneous with normal comprehension.  Cognition:    The patient is oriented to person, place, and time;     recent and remote memory intact;     language fluent;     normal attention, concentration,     fund of knowledge Cranial Nerves:    The pupils are equal, round, and reactive to light. The fundi are normal and spontaneous venous pulsations are present. Visual fields are full to finger confrontation. Extraocular movements are intact. Trigeminal sensation is intact  and the muscles of mastication are normal. The face is symmetric. The palate elevates in the midline. Hearing intact. Voice is normal. Shoulder shrug is normal. The tongue has normal motion without fasciculations.   Coordination:    Normal finger to nose and heel to shin. Normal rapid alternating movements.   Gait:    Heel-toe and tandem gait are normal.   Motor Observation:    No asymmetry, no atrophy, and no involuntary movements noted. Tone:    Normal muscle tone.    Posture:    Posture is normal. normal erect    Strength:    Strength is V/V in the upper and lower limbs.  Sensation: intact to LT     Reflex Exam:  DTR's:    Deep tendon reflexes in the upper and lower extremities are normal bilaterally.   Toes:    The toes are downgoing bilaterally.   Clonus:    Clonus is absent.       Assessment/Plan: This is a 57 year old patient here for evaluation of an interesting symptom of feeling as though she is sitting on something when she is not especially in the right lower buttocks and upper right lower extremity.  She says her right leg feels heavy and sometimes she feels funny like she has gravel in her shoes.  There is no pain is just an uncomfortable feeling.  Neuro exam is normal.  I do not think EMG nerve conduction study would yield any helpful results.  Discussed MRI of the proximal right lower extremity is this is where the symptoms are she does feel fluid or mass right where the bottom of the buttocks and the proximal thigh meet.  Discussed if this is negative and she would like further workup she is welcome to return to clinic.   Sarina Ill, MD  [facility] [address] [place], [place] [address]  Phone [phone] Fax [phone]

## 2017-12-11 ENCOUNTER — Encounter: Payer: Self-pay | Admitting: Neurology

## 2017-12-13 ENCOUNTER — Telehealth: Payer: Self-pay | Admitting: Neurology

## 2017-12-13 NOTE — Telephone Encounter (Signed)
[employer] Auth: [id number] (exp. 12/13/17 to 01/27/18) order sent to GI they will contact the patient to schedule.

## 2017-12-22 ENCOUNTER — Ambulatory Visit
Admission: RE | Admit: 2017-12-22 | Discharge: 2017-12-22 | Disposition: A | Discharge status: Home / Self Care | Payer: 59 | Source: Ambulatory Visit | Attending: Neurology | Admitting: Neurology

## 2017-12-22 DIAGNOSIS — M79651 Pain in right thigh: Secondary | ICD-10-CM

## 2017-12-22 DIAGNOSIS — M25551 Pain in right hip: Secondary | ICD-10-CM | POA: Diagnosis not present

## 2017-12-22 DIAGNOSIS — M7918 Myalgia, other site: Secondary | ICD-10-CM

## 2017-12-22 DIAGNOSIS — R29898 Other symptoms and signs involving the musculoskeletal system: Secondary | ICD-10-CM

## 2017-12-25 ENCOUNTER — Telehealth: Payer: Self-pay | Admitting: *Deleted

## 2017-12-25 NOTE — Telephone Encounter (Signed)
Called pt & LVM (ok per DPR) informing pt that her MRI of her femur was normal. Left office number for any questions but informed pt that call back is not necessary.

## 2017-12-25 NOTE — Telephone Encounter (Signed)
-----   Message from Melvenia Beam, MD sent at 12/25/2017  7:26 AM EDT ----- Imaging normal

## 2017-12-27 ENCOUNTER — Encounter: Payer: Self-pay | Admitting: Neurology

## 2017-12-28 DIAGNOSIS — H04123 Dry eye syndrome of bilateral lacrimal glands: Secondary | ICD-10-CM | POA: Diagnosis not present

## 2017-12-28 DIAGNOSIS — H402231 Chronic angle-closure glaucoma, bilateral, mild stage: Secondary | ICD-10-CM | POA: Diagnosis not present

## 2018-01-01 ENCOUNTER — Telehealth: Payer: Self-pay | Admitting: Neurology

## 2018-01-01 ENCOUNTER — Other Ambulatory Visit: Payer: Self-pay | Admitting: Neurology

## 2018-01-01 DIAGNOSIS — R202 Paresthesia of skin: Secondary | ICD-10-CM

## 2018-01-01 NOTE — Telephone Encounter (Signed)
Courtney Calhoun, I ordered an emg/ncs for patient. Would you mind calling her to schedule? Thank yoU!

## 2018-01-10 ENCOUNTER — Encounter: Payer: Self-pay | Admitting: Physician Assistant

## 2018-01-11 ENCOUNTER — Ambulatory Visit (INDEPENDENT_AMBULATORY_CARE_PROVIDER_SITE_OTHER): Payer: 59 | Admitting: Neurology

## 2018-01-11 ENCOUNTER — Ambulatory Visit: Payer: 59 | Admitting: Neurology

## 2018-01-11 DIAGNOSIS — G629 Polyneuropathy, unspecified: Secondary | ICD-10-CM | POA: Diagnosis not present

## 2018-01-11 DIAGNOSIS — R202 Paresthesia of skin: Secondary | ICD-10-CM | POA: Diagnosis not present

## 2018-01-11 DIAGNOSIS — R7309 Other abnormal glucose: Secondary | ICD-10-CM | POA: Diagnosis not present

## 2018-01-11 DIAGNOSIS — Z0289 Encounter for other administrative examinations: Secondary | ICD-10-CM

## 2018-01-11 NOTE — Progress Notes (Signed)
See procedure note.

## 2018-01-11 NOTE — Progress Notes (Signed)
This is a 57 year old patient here for evaluation of an interesting symptom of feeling as though she is sitting on something when she is not especially in the right lower buttocks and upper right lower extremity. She says her right leg feels heavy and sometimes she feels funny like she has gravel in her shoes MRI right femur was unremarkable. CT Abdomen/pelvis was unremarkable. MRi of the lumbar spine showed scoliotic curvature but otherwise no etiology found.   Discussed imaging results above. EMG/NCS shows not etiology for her symptoms. She has tingling around the right ankle, not consistent with polyneuropathy but will check hgba1c to ensure no pre-diabetes. Discussed small-fiber neuropathy but unlikely given her focal tingling in the ankle, sounds more structural or mechanical or local mononeuropathy due to this. Also consider mild peroneal neuropathy as the sensory changes are in this area, recommend conservative treatment such as do not cross legs provided literature on all diagnoses today.   Consider pelvic flor disorder for her symptoms as they do radiate into her vaginal area.   A total of 15 minutes was spent face-to-face with this patient. Over half this time was spent on counseling patient on the pelvic floor disorder  diagnosis and different diagnostic and therapeutic options, counseling and coordination of care, risks ans benefits of management, compliance, or risk factor reduction and education.  This does not include time spent performing emg/ncs.

## 2018-01-11 NOTE — Patient Instructions (Addendum)
Peripheral Neuropathy Peripheral neuropathy is a type of nerve damage. It affects nerves that carry signals between the spinal cord and other parts of the body. These are called peripheral nerves. With peripheral neuropathy, one nerve or a group of nerves may be damaged. What are the causes? Many things can damage peripheral nerves. For some people with peripheral neuropathy, the cause is unknown. Some causes include:  Diabetes. This is the most common cause of peripheral neuropathy.  Injury to a nerve.  Pressure or stress on a nerve that lasts a long time.  Too little vitamin B. Alcoholism can lead to this.  Infections.  Autoimmune diseases, such as multiple sclerosis and systemic lupus erythematosus.  Inherited nerve diseases.  Some medicines, such as cancer drugs.  Toxic substances, such as lead and mercury.  Too little blood flowing to the legs.  Kidney disease.  Thyroid disease.  What are the signs or symptoms? Different people have different symptoms. The symptoms you have will depend on which of your nerves is damaged. Common symptoms include:  Loss of feeling (numbness) in the feet and hands.  Tingling in the feet and hands.  Pain that burns.  Very sensitive skin.  Weakness.  Not being able to move a part of the body (paralysis).  Muscle twitching.  Clumsiness or poor coordination.  Loss of balance.  Not being able to control your bladder.  Feeling dizzy.  Sexual problems.  How is this diagnosed? Peripheral neuropathy is a symptom, not a disease. Finding the cause of peripheral neuropathy can be hard. To figure that out, your health care provider will take a medical history and do a physical exam. A neurological exam will also be done. This involves checking things affected by your brain, spinal cord, and nerves (nervous system). For example, your health care provider will check your reflexes, how you move, and what you can feel. Other types of tests  may also be ordered, such as:  Blood tests.  A test of the fluid in your spinal cord.  Imaging tests, such as CT scans or an MRI.  Electromyography (EMG). This test checks the nerves that control muscles.  Nerve conduction velocity tests. These tests check how fast messages pass through your nerves.  Nerve biopsy. A small piece of nerve is removed. It is then checked under a microscope.  How is this treated?  Medicine is often used to treat peripheral neuropathy. Medicines may include: ? Pain-relieving medicines. Prescription or over-the-counter medicine may be suggested. ? Antiseizure medicine. This may be used for pain. ? Antidepressants. These also may help ease pain from neuropathy. ? Lidocaine. This is a numbing medicine. You might wear a patch or be given a shot. ? Mexiletine. This medicine is typically used to help control irregular heart rhythms.  Surgery. Surgery may be needed to relieve pressure on a nerve or to destroy a nerve that is causing pain.  Physical therapy to help movement.  Assistive devices to help movement. Follow these instructions at home:  Only take over-the-counter or prescription medicines as directed by your health care provider. Follow the instructions carefully for any given medicines. Do not take any other medicines without first getting approval from your health care provider.  If you have diabetes, work closely with your health care provider to keep your blood sugar under control.  If you have numbness in your feet: ? Check every day for signs of injury or infection. Watch for redness, warmth, and swelling. ? Wear padded socks and comfortable   shoes. These help protect your feet.  Do not do things that put pressure on your damaged nerve.  Do not smoke. Smoking keeps blood from getting to damaged nerves.  Avoid or limit alcohol. Too much alcohol can cause a lack of B vitamins. These vitamins are needed for healthy nerves.  Develop a good  support system. Coping with peripheral neuropathy can be stressful. Talk to a mental health specialist or join a support group if you are struggling.  Follow up with your health care provider as directed. Contact a health care provider if:  You have new signs or symptoms of peripheral neuropathy.  You are struggling emotionally from dealing with peripheral neuropathy.  You have a fever. Get help right away if:  You have an injury or infection that is not healing.  You feel very dizzy or begin vomiting.  You have chest pain.  You have trouble breathing. This information is not intended to replace advice given to you by your health care provider. Make sure you discuss any questions you have with your health care provider. Document Released: 09/09/2002 Document Revised: 02/25/2016 Document Reviewed: 05/27/2013 Elsevier Interactive Patient Education  2017 [place] Female Pelvic Floor  The pelvic floor consists of several layers of muscles that cover the bottom of the pelvic cavity. These muscles have several distinct roles:  1. To support the pelvic organs, the bladder, uterus and colon within the pelvis. 2. To assist in stopping and starting the flow of urine or the passage of gas or stool.To aid in sexual appreciation.  How to Locate the Pelvic Floor Muscles  The Urine Stop Test . At the midstream of your urine flow, squeeze the pelvic floor muscles. You should feel the sensation of the openings close and the muscles pulling up and into the pelvic cavity.  If you have strong muscles you will slow or stop the stream of urine. . Stop or slow the flow of urine without tensing the muscles of your legs, buttocks. . Do this only to locate the muscles, not as a daily exercise. Feeling the Muscle . Place a fingertip on the anal opening. Contract and lift the muscles as though you are holding back gas or stool. You will feel your anal opening tighten. . Insert 1 or 2 fingers  into the vagina to feel the contraction and lifting of the muscles. You should feel the opening of the vagina tighten around your finger. Watching the Muscle Contract . Begin by lying on a flat surface.  Position yourself with your knees apart and bent with your head elevated and supported on several pillows. Use a mirror to look at the anal and vaginal openings and the perineal body (the area between the two openings).  . Contract or tighten the muscles around the openings and watch for a lifting of the perineal body and closure of the openings.   . If you see a bulge or feel tissues coming out of your openings, this is an incorrect contraction and you should notify your health care provider for more instructions.  2007, [employer] Doc.11

## 2018-01-11 NOTE — Progress Notes (Signed)
Full Name: Courtney Calhoun Gender: Female MRN #: [id number] Date of Birth: [birth year]    Visit Date: 01/11/2018 09:08 Age: 57 Years 24 Months Old Examining Physician: Sarina Ill, MD  Referring Physician: Midge Minium, MD    History: This is a 57 year old patient here for evaluation of an interesting symptom of feeling as though she is sitting on something when she is not especially in the right lower buttocks and upper right lower extremity.  She says her right leg feels heavy and sometimes she feels funny like she has gravel in her shoes MRI right femur was unremarkable. CT Abdomen/pelvis was unremarkable. MRi of the lumbar spine showed scoliotic curvature but otherwise no etiology found. Performing EMG/NCS today.  Summary: EMG/NCS were performed on the bilateral lower extremities.   All nerve and muscles were within normal limits  Conclusion: This is a normal study.   Cc: Midge Minium, MD    Sarina Ill, M.D.  [facility] [place], [place] [address] Tel: [phone] Fax: [phone]        [facility]    Nerve / Sites Muscle Latency Ref. Amplitude Ref. Rel Amp Segments Distance Velocity Ref. Area    ms ms mV mV %  cm m/s m/s mVms  R Peroneal - EDB     Ankle EDB 4.0 ?6.5 4.9 ?2.0 100 Ankle - EDB 9   16.3     Fib head EDB 10.5  4.1  84.2 Fib head - Ankle 32 49 ?44 14.5     Pop fossa EDB 12.6  4.4  107 Pop fossa - Fib head 10 48 ?44 15.9         Pop fossa - Ankle      L Peroneal - EDB     Ankle EDB 3.9 ?6.5 4.3 ?2.0 100 Ankle - EDB 9   17.6     Fib head EDB 10.4  3.7  84.8 Fib head - Ankle 32 49 ?44 15.2     Pop fossa EDB 12.4  3.7  102 Pop fossa - Fib head 10 49 ?44 15.7         Pop fossa - Ankle      R Tibial - AH     Ankle AH 4.0 ?5.8 11.6 ?4.0 100 Ankle - AH 9   25.2     Pop fossa AH 12.1  9.4  81.2 Pop fossa - Ankle 37 46 ?41 25.1  L Tibial - AH     Ankle AH 4.2 ?5.8 12.6 ?4.0 100 Ankle - AH 9   30.9     Pop fossa AH  12.3  9.4  74.8 Pop fossa - Ankle 37 46 ?41 28.2             SNC    Nerve / Sites Rec. Site Peak Lat Ref.  Amp Ref. Segments Distance    ms ms V V  cm  R Sural - Ankle (Calf)     Calf Ankle 3.3 ?4.4 7 ?6 Calf - Ankle 14  L Sural - Ankle (Calf)     Calf Ankle 3.4 ?4.4 12 ?6 Calf - Ankle 14  L Superficial peroneal - Ankle     Lat leg Ankle 3.6 ?4.4 7 ?6 Lat leg - Ankle 14  R Superficial peroneal - Ankle     Lat leg Ankle 3.5 ?4.4 8 ?6 Lat leg - Ankle 14  F  Wave    Nerve F Lat Ref.   ms ms  R Tibial - AH 47.9 ?56.0  L Tibial - AH 48.7 ?56.0         EMG full       EMG Summary Table    Spontaneous MUAP Recruitment  Muscle IA Fib PSW Fasc Other Amp Dur. Poly Pattern  R. Iliopsoas Normal None None None _______ Normal Normal Normal Normal  R. Vastus medialis Normal None None None _______ Normal Normal Normal Normal  R. Tibialis anterior Normal None None None _______ Normal Normal Normal Normal  R. Gastrocnemius (Medial head) Normal None None None _______ Normal Normal Normal Normal  R. Abductor hallucis Normal None None None _______ Normal Normal Normal Normal  R. Biceps femoris (long head) Normal None None None _______ Normal Normal Normal Normal  R. Gluteus maximus Normal None None None _______ Normal Normal Normal Normal  R. Gluteus medius Normal None None None _______ Normal Normal Normal Normal  R. Lumbar paraspinals (low) Normal None None None _______ Normal Normal Normal Normal

## 2018-01-12 LAB — HEMOGLOBIN A1C
Est. average glucose Bld gHb Est-mCnc: 105 mg/dL
Hgb A1c MFr Bld: 5.3 % (ref 4.8–5.6)

## 2018-01-15 ENCOUNTER — Telehealth: Payer: Self-pay | Admitting: Neurology

## 2018-01-15 NOTE — Telephone Encounter (Signed)
-----   Message from Melvenia Beam, MD sent at 01/14/2018  8:18 PM EDT ----- hgba1c looks great

## 2018-01-15 NOTE — Telephone Encounter (Signed)
Called the pt to review the labs. No answer. LVM informing the pt that her HGB A1C looked great and there were no concerns. Informed the pt to call back if she has questions.

## 2018-01-16 NOTE — Procedures (Signed)
Full Name: Courtney Calhoun Gender: Female MRN #: [id number] Date of Birth: [birth year]    Visit Date: 01/11/2018 09:08 Age: 57 Years 6 Months Old Examining Physician: Sarina Ill, MD  Referring Physician: Midge Minium, MD    History: This is a 57 year old patient here for evaluation of an interesting symptom of feeling as though she is sitting on something when she is not especially in the right lower buttocks and upper right lower extremity.  She says her right leg feels heavy and sometimes she feels funny like she has gravel in her shoes MRI right femur was unremarkable. CT Abdomen/pelvis was unremarkable. MRi of the lumbar spine showed scoliotic curvature but otherwise no etiology found. Performing EMG/NCS today.  Summary: EMG/NCS were performed on the bilateral lower extremities.   All nerves and muscles were within normal limits  Conclusion: This is a normal study.   Cc: Midge Minium, MD    Sarina Ill, M.D.  [facility] [place], [place] [address] Tel: [phone] Fax: [phone]        [facility]    Nerve / Sites Muscle Latency Ref. Amplitude Ref. Rel Amp Segments Distance Velocity Ref. Area    ms ms mV mV %  cm m/s m/s mVms  R Peroneal - EDB     Ankle EDB 4.0 ?6.5 4.9 ?2.0 100 Ankle - EDB 9   16.3     Fib head EDB 10.5  4.1  84.2 Fib head - Ankle 32 49 ?44 14.5     Pop fossa EDB 12.6  4.4  107 Pop fossa - Fib head 10 48 ?44 15.9         Pop fossa - Ankle      L Peroneal - EDB     Ankle EDB 3.9 ?6.5 4.3 ?2.0 100 Ankle - EDB 9   17.6     Fib head EDB 10.4  3.7  84.8 Fib head - Ankle 32 49 ?44 15.2     Pop fossa EDB 12.4  3.7  102 Pop fossa - Fib head 10 49 ?44 15.7         Pop fossa - Ankle      R Tibial - AH     Ankle AH 4.0 ?5.8 11.6 ?4.0 100 Ankle - AH 9   25.2     Pop fossa AH 12.1  9.4  81.2 Pop fossa - Ankle 37 46 ?41 25.1  L Tibial - AH     Ankle AH 4.2 ?5.8 12.6 ?4.0 100 Ankle - AH 9   30.9     Pop fossa AH  12.3  9.4  74.8 Pop fossa - Ankle 37 46 ?41 28.2             SNC    Nerve / Sites Rec. Site Peak Lat Ref.  Amp Ref. Segments Distance    ms ms V V  cm  R Sural - Ankle (Calf)     Calf Ankle 3.3 ?4.4 7 ?6 Calf - Ankle 14  L Sural - Ankle (Calf)     Calf Ankle 3.4 ?4.4 12 ?6 Calf - Ankle 14  L Superficial peroneal - Ankle     Lat leg Ankle 3.6 ?4.4 7 ?6 Lat leg - Ankle 14  R Superficial peroneal - Ankle     Lat leg Ankle 3.5 ?4.4 8 ?6 Lat leg - Ankle 14  F  Wave    Nerve F Lat Ref.   ms ms  R Tibial - AH 47.9 ?56.0  L Tibial - AH 48.7 ?56.0         EMG full       EMG Summary Table    Spontaneous MUAP Recruitment  Muscle IA Fib PSW Fasc Other Amp Dur. Poly Pattern  R. Iliopsoas Normal None None None _______ Normal Normal Normal Normal  R. Vastus medialis Normal None None None _______ Normal Normal Normal Normal  R. Tibialis anterior Normal None None None _______ Normal Normal Normal Normal  R. Gastrocnemius (Medial head) Normal None None None _______ Normal Normal Normal Normal  R. Abductor hallucis Normal None None None _______ Normal Normal Normal Normal  R. Biceps femoris (long head) Normal None None None _______ Normal Normal Normal Normal  R. Gluteus maximus Normal None None None _______ Normal Normal Normal Normal  R. Gluteus medius Normal None None None _______ Normal Normal Normal Normal  R. Lumbar paraspinals (low) Normal None None None _______ Normal Normal Normal Normal

## 2018-02-16 DIAGNOSIS — M21611 Bunion of right foot: Secondary | ICD-10-CM | POA: Diagnosis not present

## 2018-02-16 DIAGNOSIS — M79671 Pain in right foot: Secondary | ICD-10-CM | POA: Diagnosis not present

## 2018-02-16 DIAGNOSIS — M79672 Pain in left foot: Secondary | ICD-10-CM | POA: Diagnosis not present

## 2018-03-05 ENCOUNTER — Other Ambulatory Visit: Payer: Self-pay

## 2018-03-05 ENCOUNTER — Ambulatory Visit: Payer: 59 | Admitting: Family Medicine

## 2018-03-05 ENCOUNTER — Encounter: Payer: Self-pay | Admitting: Family Medicine

## 2018-03-05 VITALS — BP 116/74 | HR 62 | Temp 97.6°F | Resp 16 | Ht 66.0 in | Wt 151.5 lb

## 2018-03-05 DIAGNOSIS — E785 Hyperlipidemia, unspecified: Secondary | ICD-10-CM

## 2018-03-05 LAB — HEPATIC FUNCTION PANEL
ALBUMIN: 4.2 g/dL (ref 3.5–5.2)
ALT: 17 U/L (ref 0–35)
AST: 17 U/L (ref 0–37)
Alkaline Phosphatase: 56 U/L (ref 39–117)
Bilirubin, Direct: 0.2 mg/dL (ref 0.0–0.3)
TOTAL PROTEIN: 7 g/dL (ref 6.0–8.3)
Total Bilirubin: 1.6 mg/dL — ABNORMAL HIGH (ref 0.2–1.2)

## 2018-03-05 LAB — LIPID PANEL
CHOL/HDL RATIO: 3
CHOLESTEROL: 297 mg/dL — AB (ref 0–200)
HDL: 99.1 mg/dL (ref >39.00)
LDL Cholesterol: 189 mg/dL — ABNORMAL HIGH (ref 0–99)
NonHDL: 197.83
Triglycerides: 44 mg/dL (ref 0.0–149.0)
VLDL: 8.8 mg/dL (ref 0.0–40.0)

## 2018-03-05 NOTE — Progress Notes (Signed)
   Subjective:    Patient ID: Hollice Espy, female    DOB: [birth year], 57 y.o.   MRN: [id number]  HPI Hyperlipidemia- pt is holding Lipitor as she was told to b/c of leg heaviness.  Holding statin did not improve these sxs.  Due to recheck labs to day.  Pt reports feeling well.  Not exercising regularly.  No CP, SOB, HAs, abd pain, N/V, myalgias.   Review of Systems For ROS see HPI     Objective:   Physical Exam  Constitutional: She is oriented to person, place, and time. She appears well-developed and well-nourished. No distress.  HENT:  Head: Normocephalic and atraumatic.  Eyes: Pupils are equal, round, and reactive to light. Conjunctivae and EOM are normal.  Neck: Normal range of motion. Neck supple. No thyromegaly present.  Cardiovascular: Normal rate, regular rhythm, normal heart sounds and intact distal pulses.  No murmur heard. Pulmonary/Chest: Effort normal and breath sounds normal. No respiratory distress.  Abdominal: Soft. She exhibits no distension. There is no tenderness.  Musculoskeletal: She exhibits no edema.  Lymphadenopathy:    She has no cervical adenopathy.  Neurological: She is alert and oriented to person, place, and time.  Skin: Skin is warm and dry.  Psychiatric: She has a normal mood and affect. Her behavior is normal.  Vitals reviewed.         Assessment & Plan:

## 2018-03-05 NOTE — Assessment & Plan Note (Signed)
Chronic problem.  Pt reports there has been no change in her leg sxs since holding her Lipitor.  If lipids have increased dramatically, will restart statin.

## 2018-03-05 NOTE — Patient Instructions (Signed)
Follow up in 6 months to recheck cholesterol We'll notify you of your lab results and make any changes if needed Continue to work on healthy diet and regular exercise- you can do it! Call with any questions or concerns Have a great summer!!

## 2018-03-06 ENCOUNTER — Encounter: Payer: Self-pay | Admitting: General Practice

## 2018-03-14 LAB — HM PAP SMEAR

## 2018-03-28 DIAGNOSIS — M7918 Myalgia, other site: Secondary | ICD-10-CM | POA: Diagnosis not present

## 2018-03-29 DIAGNOSIS — M79671 Pain in right foot: Secondary | ICD-10-CM | POA: Diagnosis not present

## 2018-03-29 DIAGNOSIS — G5761 Lesion of plantar nerve, right lower limb: Secondary | ICD-10-CM | POA: Diagnosis not present

## 2018-03-29 DIAGNOSIS — M21611 Bunion of right foot: Secondary | ICD-10-CM | POA: Diagnosis not present

## 2018-04-12 DIAGNOSIS — M6281 Muscle weakness (generalized): Secondary | ICD-10-CM | POA: Diagnosis not present

## 2018-04-12 DIAGNOSIS — M62838 Other muscle spasm: Secondary | ICD-10-CM | POA: Diagnosis not present

## 2018-04-12 DIAGNOSIS — R102 Pelvic and perineal pain: Secondary | ICD-10-CM | POA: Diagnosis not present

## 2018-04-18 DIAGNOSIS — M6281 Muscle weakness (generalized): Secondary | ICD-10-CM | POA: Diagnosis not present

## 2018-04-18 DIAGNOSIS — R102 Pelvic and perineal pain: Secondary | ICD-10-CM | POA: Diagnosis not present

## 2018-04-18 DIAGNOSIS — M62838 Other muscle spasm: Secondary | ICD-10-CM | POA: Diagnosis not present

## 2018-04-24 DIAGNOSIS — G548 Other nerve root and plexus disorders: Secondary | ICD-10-CM | POA: Diagnosis not present

## 2018-04-24 DIAGNOSIS — M6281 Muscle weakness (generalized): Secondary | ICD-10-CM | POA: Diagnosis not present

## 2018-04-24 DIAGNOSIS — M62838 Other muscle spasm: Secondary | ICD-10-CM | POA: Diagnosis not present

## 2018-05-09 DIAGNOSIS — R102 Pelvic and perineal pain: Secondary | ICD-10-CM | POA: Diagnosis not present

## 2018-05-09 DIAGNOSIS — M62838 Other muscle spasm: Secondary | ICD-10-CM | POA: Diagnosis not present

## 2018-05-09 DIAGNOSIS — M6281 Muscle weakness (generalized): Secondary | ICD-10-CM | POA: Diagnosis not present

## 2018-05-14 DIAGNOSIS — Z6825 Body mass index (BMI) 25.0-25.9, adult: Secondary | ICD-10-CM | POA: Diagnosis not present

## 2018-05-14 DIAGNOSIS — Z01419 Encounter for gynecological examination (general) (routine) without abnormal findings: Secondary | ICD-10-CM | POA: Diagnosis not present

## 2018-05-15 LAB — HM PAP SMEAR: HPV, high-risk: NEGATIVE

## 2018-05-16 DIAGNOSIS — G548 Other nerve root and plexus disorders: Secondary | ICD-10-CM | POA: Diagnosis not present

## 2018-05-16 DIAGNOSIS — M62838 Other muscle spasm: Secondary | ICD-10-CM | POA: Diagnosis not present

## 2018-05-16 DIAGNOSIS — M6281 Muscle weakness (generalized): Secondary | ICD-10-CM | POA: Diagnosis not present

## 2018-05-24 DIAGNOSIS — M6281 Muscle weakness (generalized): Secondary | ICD-10-CM | POA: Diagnosis not present

## 2018-05-24 DIAGNOSIS — R102 Pelvic and perineal pain: Secondary | ICD-10-CM | POA: Diagnosis not present

## 2018-05-24 DIAGNOSIS — M62838 Other muscle spasm: Secondary | ICD-10-CM | POA: Diagnosis not present

## 2018-05-27 IMAGING — MR MR FEMUR*R* W/O CM
6 series · 40 of 40 positions shown · non-contrast
Comparison: None.

CLINICAL DATA: Pain and discomfort in the right hip and buttock
since September 2017. No known injury.

EXAM:
MRI OF THE RIGHT FEMUR WITHOUT CONTRAST
TECHNIQUE: Multiplanar, multisequence MR imaging of the right femur was
performed. No intravenous contrast was administered.

[Series 4: T2 fat-sat · coronal · 6.0mm · 1.25mm/px · 6 of 26 slices shown (1 of 3)]
[im 1/26]
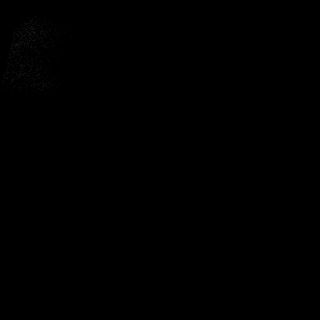
[im 6/26]
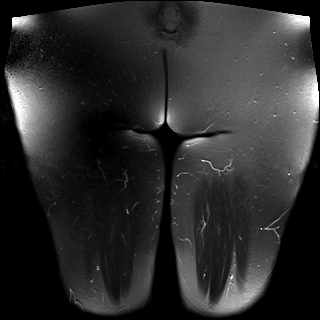
[im 11/26]
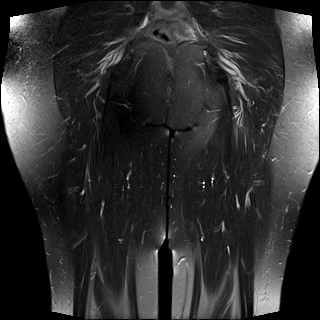
[im 16/26]
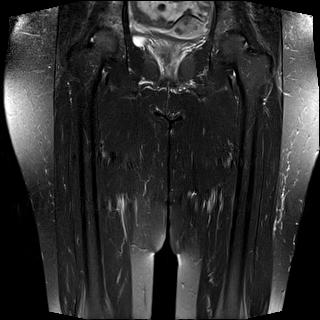
[im 21/26]
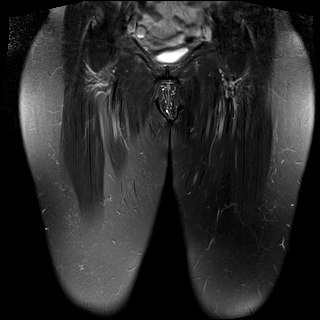
[im 26/26]
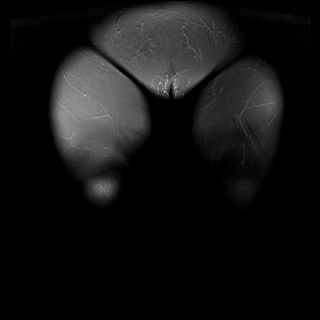

[Series 5: T1 · axial · 6.0mm · 1.04mm/px · z∈[-209,+136]mm · 8 of 45 slices shown (1 of 2)]
[im 1/45]
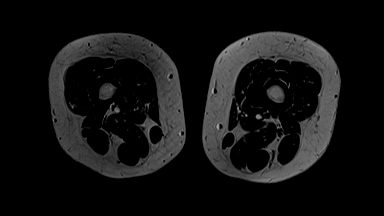
[im 7/45]
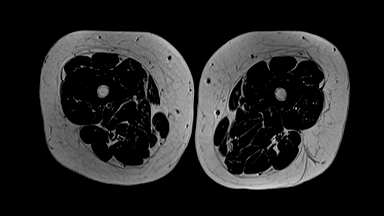
[im 13/45]
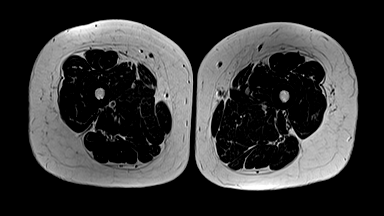
[im 19/45]
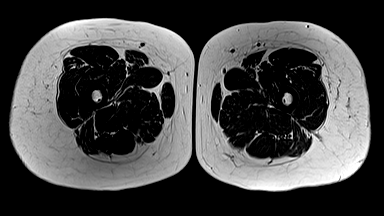
[im 26/45]
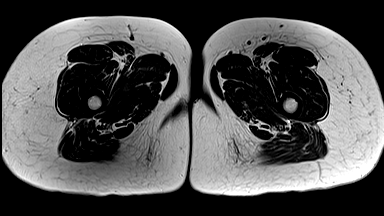
[im 32/45]
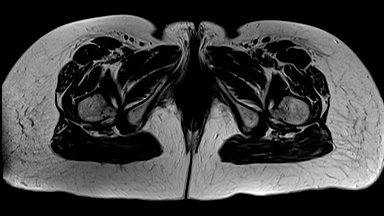
[im 38/45]
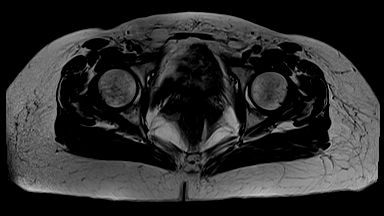
[im 45/45]
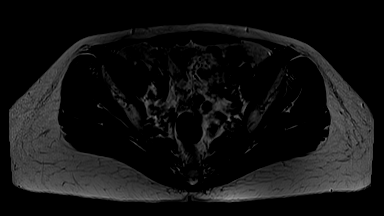

[Series 6: T2 fat-sat · axial · 6.0mm · 1.25mm/px · z∈[-209,+136]mm · 8 of 46 slices shown (2 of 3)]
[im 1/46]
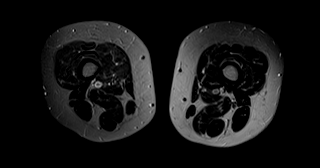
[im 7/46]
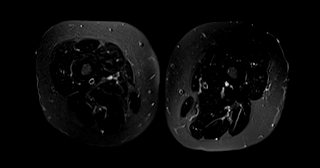
[im 13/46]
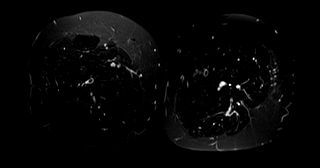
[im 20/46]
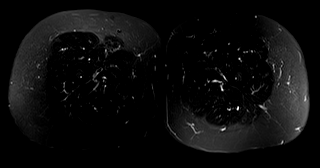
[im 26/46]
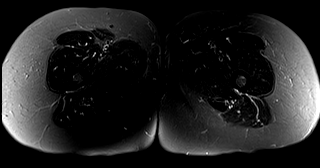
[im 33/46]
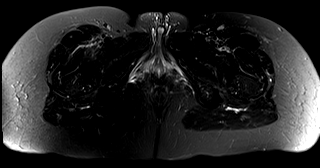
[im 39/46]
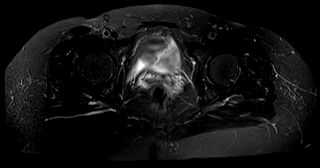
[im 46/46]
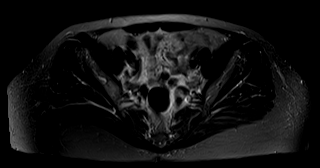

[Series 7: T1 · sagittal · 6.0mm · 0.47mm/px · 5 of 31 slices shown (2 of 2)]
[im 1/31]
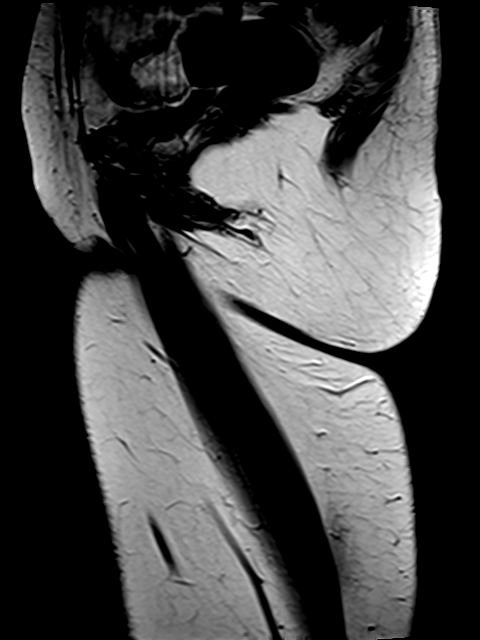
[im 8/31]
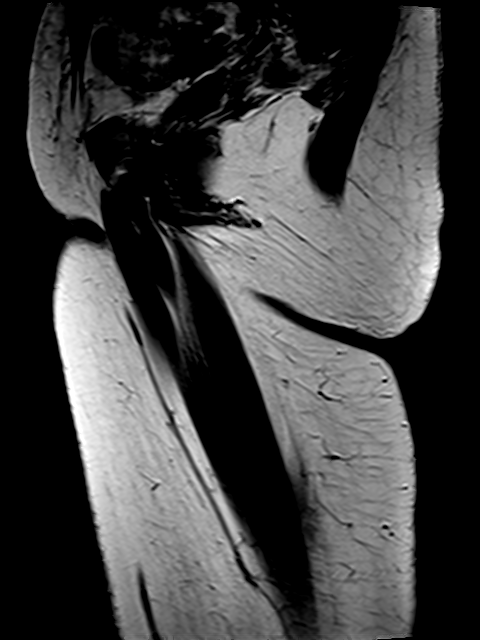
[im 16/31]
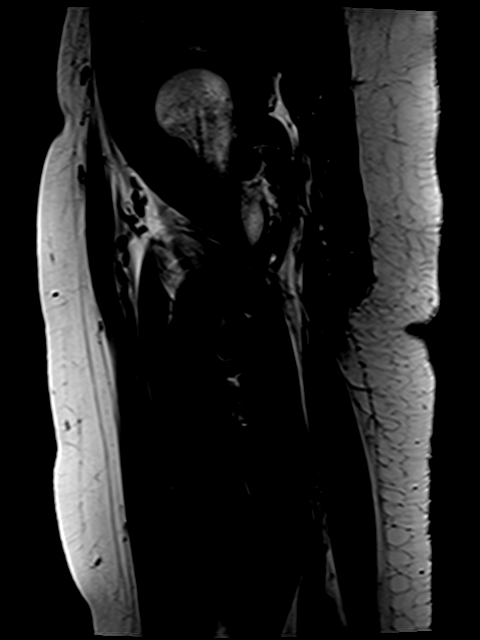
[im 23/31]
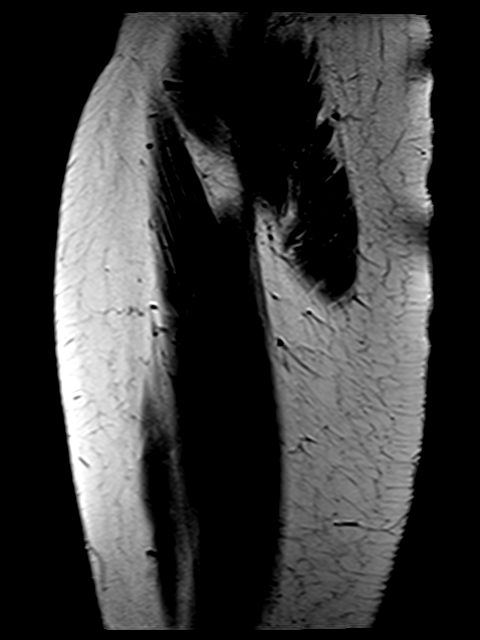
[im 31/31]
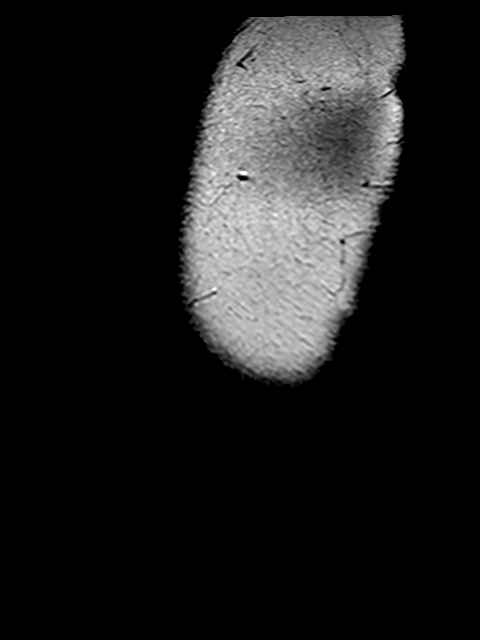

[Series 8: T2 fat-sat · sagittal · 6.0mm · 1.17mm/px · 5 of 31 slices shown (3 of 3)]
[im 1/31]
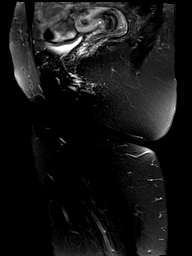
[im 8/31]
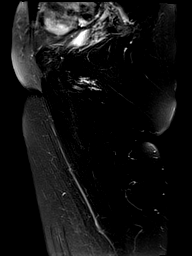
[im 16/31]
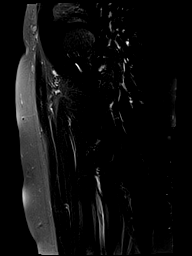
[im 23/31]
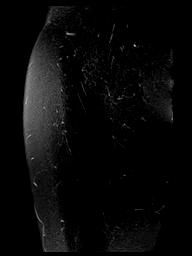
[im 31/31]
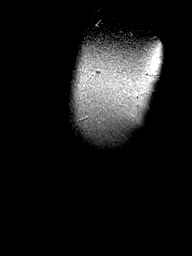

[Series 9: STIR · axial · 6.0mm · 1.25mm/px · z∈[-209,+136]mm · 8 of 47 slices shown]
[im 1/47]
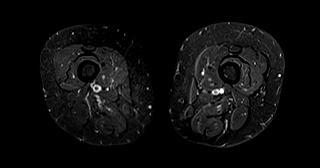
[im 7/47]
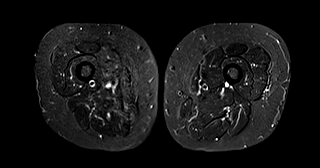
[im 14/47]
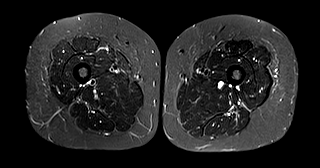
[im 20/47]
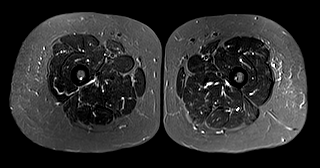
[im 27/47]
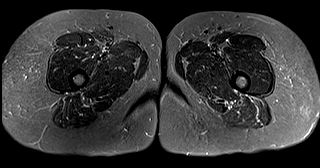
[im 33/47]
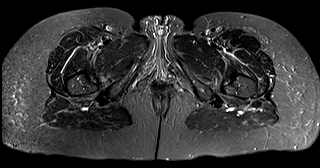
[im 40/47]
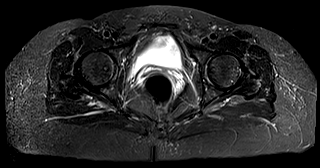
[im 47/47]
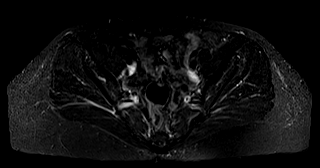

[40 of 40 positions shown; findings below may reference images not displayed]

FINDINGS: Bones/Joint/Cartilage

Marrow signal is normal throughout without fracture, stress change
or focal lesion. No avascular necrosis of the femoral heads. No
subchondral cyst formation or edema about the hips.

Ligaments

Intact.

Muscles and Tendons

Intact and normal in appearance.

Soft tissues

Imaged intrapelvic contents appear normal. No soft tissue mass or
fluid collection is identified.
IMPRESSION: Normal exam.  No finding to explain the patient's symptoms.

## 2018-05-30 DIAGNOSIS — M62838 Other muscle spasm: Secondary | ICD-10-CM | POA: Diagnosis not present

## 2018-05-30 DIAGNOSIS — R102 Pelvic and perineal pain: Secondary | ICD-10-CM | POA: Diagnosis not present

## 2018-05-30 DIAGNOSIS — M6281 Muscle weakness (generalized): Secondary | ICD-10-CM | POA: Diagnosis not present

## 2018-05-31 DIAGNOSIS — M79671 Pain in right foot: Secondary | ICD-10-CM | POA: Diagnosis not present

## 2018-05-31 DIAGNOSIS — M21611 Bunion of right foot: Secondary | ICD-10-CM | POA: Diagnosis not present

## 2018-05-31 DIAGNOSIS — G5761 Lesion of plantar nerve, right lower limb: Secondary | ICD-10-CM | POA: Diagnosis not present

## 2018-06-06 DIAGNOSIS — M62838 Other muscle spasm: Secondary | ICD-10-CM | POA: Diagnosis not present

## 2018-06-06 DIAGNOSIS — R102 Pelvic and perineal pain: Secondary | ICD-10-CM | POA: Diagnosis not present

## 2018-06-06 DIAGNOSIS — M6281 Muscle weakness (generalized): Secondary | ICD-10-CM | POA: Diagnosis not present

## 2018-06-13 DIAGNOSIS — R102 Pelvic and perineal pain: Secondary | ICD-10-CM | POA: Diagnosis not present

## 2018-06-13 DIAGNOSIS — M62838 Other muscle spasm: Secondary | ICD-10-CM | POA: Diagnosis not present

## 2018-06-13 DIAGNOSIS — M6281 Muscle weakness (generalized): Secondary | ICD-10-CM | POA: Diagnosis not present

## 2018-06-21 DIAGNOSIS — M6281 Muscle weakness (generalized): Secondary | ICD-10-CM | POA: Diagnosis not present

## 2018-06-21 DIAGNOSIS — M79671 Pain in right foot: Secondary | ICD-10-CM | POA: Diagnosis not present

## 2018-06-21 DIAGNOSIS — R102 Pelvic and perineal pain: Secondary | ICD-10-CM | POA: Diagnosis not present

## 2018-06-21 DIAGNOSIS — M21611 Bunion of right foot: Secondary | ICD-10-CM | POA: Diagnosis not present

## 2018-06-21 DIAGNOSIS — M62838 Other muscle spasm: Secondary | ICD-10-CM | POA: Diagnosis not present

## 2018-06-21 DIAGNOSIS — G5761 Lesion of plantar nerve, right lower limb: Secondary | ICD-10-CM | POA: Diagnosis not present

## 2018-06-25 DIAGNOSIS — Z1231 Encounter for screening mammogram for malignant neoplasm of breast: Secondary | ICD-10-CM | POA: Diagnosis not present

## 2018-06-25 LAB — HM MAMMOGRAPHY

## 2018-06-26 ENCOUNTER — Encounter: Payer: Self-pay | Admitting: General Practice

## 2018-06-27 DIAGNOSIS — R102 Pelvic and perineal pain: Secondary | ICD-10-CM | POA: Diagnosis not present

## 2018-06-27 DIAGNOSIS — M6281 Muscle weakness (generalized): Secondary | ICD-10-CM | POA: Diagnosis not present

## 2018-06-27 DIAGNOSIS — M62838 Other muscle spasm: Secondary | ICD-10-CM | POA: Diagnosis not present

## 2018-07-03 DIAGNOSIS — H402231 Chronic angle-closure glaucoma, bilateral, mild stage: Secondary | ICD-10-CM | POA: Diagnosis not present

## 2018-07-03 DIAGNOSIS — H25013 Cortical age-related cataract, bilateral: Secondary | ICD-10-CM | POA: Diagnosis not present

## 2018-07-03 DIAGNOSIS — H04123 Dry eye syndrome of bilateral lacrimal glands: Secondary | ICD-10-CM | POA: Diagnosis not present

## 2018-07-04 DIAGNOSIS — M6281 Muscle weakness (generalized): Secondary | ICD-10-CM | POA: Diagnosis not present

## 2018-07-04 DIAGNOSIS — M62838 Other muscle spasm: Secondary | ICD-10-CM | POA: Diagnosis not present

## 2018-07-04 DIAGNOSIS — R102 Pelvic and perineal pain: Secondary | ICD-10-CM | POA: Diagnosis not present

## 2018-07-19 DIAGNOSIS — M6281 Muscle weakness (generalized): Secondary | ICD-10-CM | POA: Diagnosis not present

## 2018-07-19 DIAGNOSIS — M62838 Other muscle spasm: Secondary | ICD-10-CM | POA: Diagnosis not present

## 2018-07-19 DIAGNOSIS — G548 Other nerve root and plexus disorders: Secondary | ICD-10-CM | POA: Diagnosis not present

## 2018-07-19 DIAGNOSIS — M79671 Pain in right foot: Secondary | ICD-10-CM | POA: Diagnosis not present

## 2018-07-19 DIAGNOSIS — G5761 Lesion of plantar nerve, right lower limb: Secondary | ICD-10-CM | POA: Diagnosis not present

## 2018-07-30 DIAGNOSIS — M6281 Muscle weakness (generalized): Secondary | ICD-10-CM | POA: Diagnosis not present

## 2018-07-30 DIAGNOSIS — G548 Other nerve root and plexus disorders: Secondary | ICD-10-CM | POA: Diagnosis not present

## 2018-07-30 DIAGNOSIS — M62838 Other muscle spasm: Secondary | ICD-10-CM | POA: Diagnosis not present

## 2018-08-16 DIAGNOSIS — M62838 Other muscle spasm: Secondary | ICD-10-CM | POA: Diagnosis not present

## 2018-08-16 DIAGNOSIS — R102 Pelvic and perineal pain: Secondary | ICD-10-CM | POA: Diagnosis not present

## 2018-08-16 DIAGNOSIS — M6281 Muscle weakness (generalized): Secondary | ICD-10-CM | POA: Diagnosis not present

## 2018-09-13 ENCOUNTER — Encounter: Payer: Self-pay | Admitting: Family Medicine

## 2018-09-13 ENCOUNTER — Ambulatory Visit: Payer: 59 | Admitting: Family Medicine

## 2018-09-13 ENCOUNTER — Encounter: Payer: Self-pay | Admitting: General Practice

## 2018-09-13 ENCOUNTER — Other Ambulatory Visit: Payer: Self-pay

## 2018-09-13 VITALS — BP 121/81 | HR 61 | Temp 98.1°F | Resp 16 | Ht 66.0 in | Wt 163.1 lb

## 2018-09-13 DIAGNOSIS — E785 Hyperlipidemia, unspecified: Secondary | ICD-10-CM

## 2018-09-13 LAB — LIPID PANEL
Cholesterol: 172 mg/dL (ref 0–200)
HDL: 99.3 mg/dL (ref >39.00)
LDL Cholesterol: 65 mg/dL (ref 0–99)
NONHDL: 72.27
Total CHOL/HDL Ratio: 2
Triglycerides: 37 mg/dL (ref 0.0–149.0)
VLDL: 7.4 mg/dL (ref 0.0–40.0)

## 2018-09-13 LAB — HEPATIC FUNCTION PANEL
ALBUMIN: 4.1 g/dL (ref 3.5–5.2)
ALT: 23 U/L (ref 0–35)
AST: 20 U/L (ref 0–37)
Alkaline Phosphatase: 61 U/L (ref 39–117)
BILIRUBIN DIRECT: 0.3 mg/dL (ref 0.0–0.3)
BILIRUBIN TOTAL: 1.6 mg/dL — AB (ref 0.2–1.2)
TOTAL PROTEIN: 7.1 g/dL (ref 6.0–8.3)

## 2018-09-13 LAB — BASIC METABOLIC PANEL
BUN: 9 mg/dL (ref 6–23)
CALCIUM: 9.3 mg/dL (ref 8.4–10.5)
CHLORIDE: 108 meq/L (ref 96–112)
CO2: 28 meq/L (ref 19–32)
CREATININE: 0.89 mg/dL (ref 0.40–1.20)
GFR: 83.99 mL/min (ref >60.00)
Glucose, Bld: 83 mg/dL (ref 70–99)
Potassium: 3.9 mEq/L (ref 3.5–5.1)
Sodium: 141 mEq/L (ref 135–145)

## 2018-09-13 MED ORDER — ATORVASTATIN CALCIUM 10 MG PO TABS: 10.0000 mg | ORAL_TABLET | Freq: Every day | ORAL | 1 refills | Status: DC

## 2018-09-13 NOTE — Patient Instructions (Signed)
Schedule your complete physical in 6 months We'll notify you of your lab results and make any changes if needed Continue to work on healthy diet and regular exercise Limit your salt intake- this will improve the tightness in your feet and ankles Call with any questions or concerns Happy Holidays!!

## 2018-09-13 NOTE — Progress Notes (Signed)
   Subjective:    Patient ID: Courtney Calhoun, female    DOB: 1961-05-26, 57 y.o.   MRN: [id number]  HPI Hyperlipidemia- chronic problem.  On Lipitor 10mg  daily.  Pt has gained 10 lbs since last visit.  Recently resumed once weekly exercise- hoping to increase to twice weekly.  No CP, SOB, HAs, visual changes, abd pain, N/V.   Review of Systems For ROS see HPI     Objective:   Physical Exam Vitals signs reviewed.  Constitutional:      General: She is not in acute distress.    Appearance: She is well-developed.  HENT:     Head: Normocephalic and atraumatic.  Eyes:     Conjunctiva/sclera: Conjunctivae normal.     Pupils: Pupils are equal, round, and reactive to light.  Neck:     Musculoskeletal: Normal range of motion and neck supple.     Thyroid: No thyromegaly.  Cardiovascular:     Rate and Rhythm: Normal rate and regular rhythm.     Heart sounds: Normal heart sounds. No murmur.  Pulmonary:     Effort: Pulmonary effort is normal. No respiratory distress.     Breath sounds: Normal breath sounds.  Abdominal:     General: There is no distension.     Palpations: Abdomen is soft.     Tenderness: There is no abdominal tenderness.  Lymphadenopathy:     Cervical: No cervical adenopathy.  Skin:    General: Skin is warm and dry.  Neurological:     Mental Status: She is alert and oriented to person, place, and time.  Psychiatric:        Behavior: Behavior normal.           Assessment & Plan:

## 2018-09-13 NOTE — Assessment & Plan Note (Signed)
Chronic problem.  Tolerating statin w/o difficulty.  Encouraged healthy diet and regular exercise.  Check labs.  Adjust meds prn. 

## 2018-09-24 ENCOUNTER — Ambulatory Visit: Payer: Self-pay

## 2018-09-24 DIAGNOSIS — H6691 Otitis media, unspecified, right ear: Secondary | ICD-10-CM | POA: Diagnosis not present

## 2018-09-24 DIAGNOSIS — J3489 Other specified disorders of nose and nasal sinuses: Secondary | ICD-10-CM | POA: Diagnosis not present

## 2018-09-24 NOTE — Telephone Encounter (Signed)
Pt called to reschedule a my Chart appointment and states that she has been dizzy. Pt states that she has had cold symptoms and has had 2 episodes of vertigo( room spinning) since Friday.   Her ears feel full.  She is unable to be seen in the office because she has to work. Pt states she has a hx of fluid behind her ear and takes antihistamine daily for prevention.  She had vomiting on Friday with the dizziness. She is unsure if she has a fever. Per protocol pt was advised to be seen at urgent care for her symptoms. Pt states she would try an E visit. Care advice read to patient.  Patient verbalized understanding of all instructions.  Reason for Disposition . [1] MODERATE dizziness (e.g., vertigo; feels very unsteady, interferes with normal activities) AND [2] has NOT been evaluated by physician for this  Answer Assessment - Initial Assessment Questions 1. DESCRIPTION: "Describe your dizziness."     Room spinning Friday and short time 2. VERTIGO: "Do you feel like either you or the room is spinning or tilting?"      spinning 3. LIGHTHEADED: "Do you feel lightheaded?" (e.g., somewhat faint, woozy, weak upon standing)     no 4. SEVERITY: "How bad is it?"  "Can you walk?"   - MILD - Feels unsteady but walking normally.   - MODERATE - Feels very unsteady when walking, but not falling; interferes with normal activities (e.g., school, work) .   - SEVERE - Unable to walk without falling (requires assistance).     Moderate 5. ONSET:  "When did the dizziness begin?"     Friday 6. AGGRAVATING FACTORS: "Does anything make it worse?" (e.g., standing, change in head position)     Head feels funny when moving sis to side 7. CAUSE: "What do you think is causing the dizziness?"     Sinus feel ear is stopped up 8. RECURRENT SYMPTOM: "Have you had dizziness before?" If so, ask: "When was the last time?" "What happened that time?"     Fluid behind ear in past 9. OTHER SYMPTOMS: "Do you have any other  symptoms?" (e.g., headache, weakness, numbness, vomiting, earache)     Vomiting Friday  10. PREGNANCY: "Is there any chance you are pregnant?" "When was your last menstrual period?"       N/A  Protocols used: DIZZINESS - VERTIGO-A-AH

## 2018-09-28 ENCOUNTER — Ambulatory Visit: Payer: 59 | Admitting: Family Medicine

## 2018-10-08 ENCOUNTER — Ambulatory Visit: Payer: 59 | Admitting: Family Medicine

## 2018-10-11 ENCOUNTER — Ambulatory Visit: Payer: 59 | Admitting: Family Medicine

## 2018-10-11 ENCOUNTER — Other Ambulatory Visit: Payer: Self-pay

## 2018-10-11 ENCOUNTER — Encounter: Payer: Self-pay | Admitting: Family Medicine

## 2018-10-11 VITALS — BP 118/70 | HR 79 | Temp 97.9°F | Resp 17 | Ht 66.0 in | Wt 169.4 lb

## 2018-10-11 DIAGNOSIS — H6981 Other specified disorders of Eustachian tube, right ear: Secondary | ICD-10-CM | POA: Diagnosis not present

## 2018-10-11 DIAGNOSIS — M21611 Bunion of right foot: Secondary | ICD-10-CM | POA: Diagnosis not present

## 2018-10-11 DIAGNOSIS — M79672 Pain in left foot: Secondary | ICD-10-CM | POA: Diagnosis not present

## 2018-10-11 DIAGNOSIS — M79671 Pain in right foot: Secondary | ICD-10-CM | POA: Diagnosis not present

## 2018-10-11 MED ORDER — FLUTICASONE PROPIONATE 50 MCG/ACT NA SUSP: 2.0000 | Freq: Every day | NASAL | 6 refills | Status: DC

## 2018-10-11 NOTE — Progress Notes (Signed)
   Subjective:    Patient ID: Courtney Calhoun, female    DOB: [birth year], 58 y.o.   MRN: [id number]  HPI R ear pain- went to [facility] on 12/23.  Was given Augmentin for R OM.  Pt reports ear improved but sxs returned today.  No fevers.  No drainage from ear.  No pain in L ear.  + nasal congestion.  Pt notes sinus pressure yesterday 'behind my eyes'.     Review of Systems For ROS see HPI     Objective:   Physical Exam Vitals signs reviewed.  Constitutional:      General: She is not in acute distress.    Appearance: Normal appearance. She is well-developed.  HENT:     Head: Normocephalic and atraumatic.     Left Ear: Tympanic membrane normal.     Ears:     Comments: R TM retracted, L TM WNL    Nose: Mucosal edema and rhinorrhea present.     Right Sinus: No maxillary sinus tenderness or frontal sinus tenderness.     Left Sinus: No maxillary sinus tenderness or frontal sinus tenderness.     Mouth/Throat:     Pharynx: Posterior oropharyngeal erythema (w/ PND) present.  Eyes:     Conjunctiva/sclera: Conjunctivae normal.     Pupils: Pupils are equal, round, and reactive to light.  Neck:     Musculoskeletal: Normal range of motion and neck supple.  Cardiovascular:     Rate and Rhythm: Normal rate and regular rhythm.     Heart sounds: Normal heart sounds.  Pulmonary:     Effort: Pulmonary effort is normal. No respiratory distress.     Breath sounds: Normal breath sounds. No wheezing or rales.  Lymphadenopathy:     Cervical: No cervical adenopathy.  Neurological:     Mental Status: She is alert.           Assessment & Plan:  Eustachian tube dysfxn- new.  Reviewed dx and tx plan w/ pt.  No evidence of infxn- no need for abx.  Start nasal steroid daily.  Restart antihistamine daily.  Reviewed supportive care and red flags that should prompt return.  Pt expressed understanding and is in agreement w/ plan.

## 2018-10-11 NOTE — Patient Instructions (Signed)
Follow up as needed or as scheduled START the Fluticasone- 2 sprays each nostril daily RESTART the Zyrtec (Cetirizine) daily Drink plenty of fluids Call with any questions or concerns Hang in there! Happy New Year!!!

## 2018-11-13 DIAGNOSIS — M79671 Pain in right foot: Secondary | ICD-10-CM | POA: Diagnosis not present

## 2018-11-13 DIAGNOSIS — M21611 Bunion of right foot: Secondary | ICD-10-CM | POA: Diagnosis not present

## 2018-11-13 DIAGNOSIS — M79672 Pain in left foot: Secondary | ICD-10-CM | POA: Diagnosis not present

## 2018-11-22 ENCOUNTER — Encounter: Payer: Self-pay | Admitting: Gastroenterology

## 2018-12-05 DIAGNOSIS — M62838 Other muscle spasm: Secondary | ICD-10-CM | POA: Diagnosis not present

## 2018-12-05 DIAGNOSIS — M6281 Muscle weakness (generalized): Secondary | ICD-10-CM | POA: Diagnosis not present

## 2018-12-05 DIAGNOSIS — G548 Other nerve root and plexus disorders: Secondary | ICD-10-CM | POA: Diagnosis not present

## 2018-12-17 ENCOUNTER — Ambulatory Visit (AMBULATORY_SURGERY_CENTER): Payer: Self-pay | Admitting: *Deleted

## 2018-12-17 ENCOUNTER — Other Ambulatory Visit: Payer: Self-pay

## 2018-12-17 VITALS — Temp 96.9°F | Ht 66.0 in | Wt 160.8 lb

## 2018-12-17 DIAGNOSIS — Z1211 Encounter for screening for malignant neoplasm of colon: Secondary | ICD-10-CM

## 2018-12-17 MED ORDER — SUPREP BOWEL PREP KIT 17.5-3.13-1.6 GM/177ML PO SOLN: 1.0000 | Freq: Once | ORAL | 0 refills | Status: AC

## 2018-12-17 NOTE — Progress Notes (Signed)
No egg or soy allergy known to patient  No issues with past sedation with any surgeries  or procedures, no intubation problems  No diet pills per patient No home 02 use per patient  No blood thinners per patient  Pt denies issues with constipation  No A fib or A flutter  EMMI video  Offered and declined by the patient. Patient instructed to drink 3 glasses of water following prep in evening and morning, written on prep instructions. Patient verbalizing understanding.

## 2018-12-20 ENCOUNTER — Encounter: Payer: Self-pay | Admitting: Gastroenterology

## 2018-12-27 ENCOUNTER — Telehealth: Payer: Self-pay | Admitting: *Deleted

## 2018-12-27 NOTE — Telephone Encounter (Signed)
Called pt to notify of cancellation of appointment 01/03/19, no answer, message left and that we would call back to reschedule when schedules are available.

## 2019-01-03 ENCOUNTER — Encounter: Payer: 59 | Admitting: Gastroenterology

## 2019-01-08 ENCOUNTER — Other Ambulatory Visit: Payer: Self-pay | Admitting: Family Medicine

## 2019-02-04 ENCOUNTER — Encounter: Payer: Self-pay | Admitting: Family Medicine

## 2019-02-06 ENCOUNTER — Telehealth: Payer: Self-pay | Admitting: *Deleted

## 2019-02-06 NOTE — Telephone Encounter (Signed)
Called pt to reschedule previously cancelled colonoscopy due to Covid-19. Pt has prep. Instructions redone and sent to pt via MyChart.

## 2019-02-08 ENCOUNTER — Telehealth: Payer: Self-pay | Admitting: *Deleted

## 2019-02-08 NOTE — Telephone Encounter (Signed)
LMOM to confirm appt and to go over covid screening questions. Will call back

## 2019-02-08 NOTE — Telephone Encounter (Signed)
Covid-19 travel screening questions  Have you traveled in the last 14 days? no If yes where?  Do you now or have you had a fever in the last 14 days? no  Do you have any respiratory symptoms of shortness of breath or cough now or in the last 14 days? no  Do you have any family members or close contacts with diagnosed or suspected Covid-19? No  Pt is aware that care partner will be waiting in the car during procedure.  She will wear a mask into building       

## 2019-02-11 ENCOUNTER — Encounter: Payer: Self-pay | Admitting: Gastroenterology

## 2019-02-11 ENCOUNTER — Ambulatory Visit (AMBULATORY_SURGERY_CENTER): Payer: 59 | Admitting: Gastroenterology

## 2019-02-11 ENCOUNTER — Other Ambulatory Visit: Payer: Self-pay

## 2019-02-11 VITALS — BP 111/68 | HR 62 | Temp 97.9°F | Resp 12 | Ht 66.0 in | Wt 160.0 lb

## 2019-02-11 DIAGNOSIS — D123 Benign neoplasm of transverse colon: Secondary | ICD-10-CM

## 2019-02-11 DIAGNOSIS — K621 Rectal polyp: Secondary | ICD-10-CM | POA: Diagnosis not present

## 2019-02-11 DIAGNOSIS — Z1211 Encounter for screening for malignant neoplasm of colon: Secondary | ICD-10-CM | POA: Diagnosis not present

## 2019-02-11 DIAGNOSIS — K6289 Other specified diseases of anus and rectum: Secondary | ICD-10-CM

## 2019-02-11 MED ORDER — SODIUM CHLORIDE 0.9 % IV SOLN: 500.0000 mL | Freq: Once | INTRAVENOUS | Status: DC

## 2019-02-11 NOTE — Progress Notes (Signed)
Called to room to assist during endoscopic procedure.  Patient ID and intended procedure confirmed with present staff. Received instructions for my participation in the procedure from the performing physician.  

## 2019-02-11 NOTE — Patient Instructions (Signed)
Information on polyps given to you today.  Await pathology results.   YOU HAD AN ENDOSCOPIC PROCEDURE TODAY AT [facility]:   Refer to the procedure report that was given to you for any specific questions about what was found during the examination.  If the procedure report does not answer your questions, please call your gastroenterologist to clarify.  If you requested that your care partner not be given the details of your procedure findings, then the procedure report has been included in a sealed envelope for you to review at your convenience later.  YOU SHOULD EXPECT: Some feelings of bloating in the abdomen. Passage of more gas than usual.  Walking can help get rid of the air that was put into your GI tract during the procedure and reduce the bloating. If you had a lower endoscopy (such as a colonoscopy or flexible sigmoidoscopy) you may notice spotting of blood in your stool or on the toilet paper. If you underwent a bowel prep for your procedure, you may not have a normal bowel movement for a few days.  Please Note:  You might notice some irritation and congestion in your nose or some drainage.  This is from the oxygen used during your procedure.  There is no need for concern and it should clear up in a day or so.  SYMPTOMS TO REPORT IMMEDIATELY:   Following lower endoscopy (colonoscopy or flexible sigmoidoscopy):  Excessive amounts of blood in the stool  Significant tenderness or worsening of abdominal pains  Swelling of the abdomen that is new, acute  Fever of 100F or higher  For urgent or emergent issues, a gastroenterologist can be reached at any hour by calling [phone].   DIET:  We do recommend a small meal at first, but then you may proceed to your regular diet.  Drink plenty of fluids but you should avoid alcoholic beverages for 24 hours.  ACTIVITY:  You should plan to take it easy for the rest of today and you should NOT DRIVE or use heavy machinery  until tomorrow (because of the sedation medicines used during the test).    FOLLOW UP: Our staff will call the number listed on your records the next business day following your procedure to check on you and address any questions or concerns that you may have regarding the information given to you following your procedure. If we do not reach you, we will leave a message.  However, if you are feeling well and you are not experiencing any problems, there is no need to return our call.  We will assume that you have returned to your regular daily activities without incident. We will be calling you two weeks following your procedure to see if you have developed any symptoms of the COVID-19.  If you develop any symptoms before then, please let us know.  If any biopsies were taken you will be contacted by phone or by letter within the next 1-3 weeks.  Please call us at [phone] if you have not heard about the biopsies in 3 weeks.    SIGNATURES/CONFIDENTIALITY: You and/or your care partner have signed paperwork which will be entered into your electronic medical record.  These signatures attest to the fact that that the information above on your After Visit Summary has been reviewed and is understood.  Full responsibility of the confidentiality of this discharge information lies with you and/or your care-partner.

## 2019-02-11 NOTE — Progress Notes (Signed)
A/ox3, pleased with MAC, report to RN 

## 2019-02-11 NOTE — Op Note (Signed)
[facility] Patient Name: Courtney Calhoun Procedure Date: 02/11/2019 2:47 PM MRN: [id number] Endoscopist: Mauri Pole , MD Age: 58 Referring MD:  Date of Birth: [birth year] Gender: Female Account #: [id number] Procedure:                Colonoscopy Indications:              Screening for colorectal malignant neoplasm Medicines:                Monitored Anesthesia Care Procedure:                Pre-Anesthesia Assessment:                           - Prior to the procedure, a History and Physical                            was performed, and patient medications and                            allergies were reviewed. The patient's tolerance of                            previous anesthesia was also reviewed. The risks                            and benefits of the procedure and the sedation                            options and risks were discussed with the patient.                            All questions were answered, and informed consent                            was obtained. Prior Anticoagulants: The patient has                            taken no previous anticoagulant or antiplatelet                            agents. ASA Grade Assessment: II - A patient with                            mild systemic disease. After reviewing the risks                            and benefits, the patient was deemed in                            satisfactory condition to undergo the procedure.                           After obtaining informed consent, the colonoscope  was passed under direct vision. Throughout the                            procedure, the patient's blood pressure, pulse, and                            oxygen saturations were monitored continuously. The                            Colonoscope was introduced through the anus and                            advanced to the the cecum, identified by                            appendiceal orifice  and ileocecal valve. The                            colonoscopy was performed without difficulty. The                            patient tolerated the procedure well. The quality                            of the bowel preparation was good. The ileocecal                            valve, appendiceal orifice, and rectum were                            photographed. Scope In: 2:57:48 PM Scope Out: 3:14:00 PM Scope Withdrawal Time: 0 hours 12 minutes 8 seconds  Total Procedure Duration: 0 hours 16 minutes 12 seconds  Findings:                 The perianal and digital rectal examinations were                            normal.                           A few small-mouthed diverticula were found in the                            ascending colon.                           A 7 mm polyp was found in the transverse colon. The                            polyp was sessile. The polyp was removed with a                            cold snare. Resection and retrieval were complete.  A patchy area of mildly erythematous mucosa was                            found in the distal rectum. Biopsies were taken                            with a cold forceps for histology.                           Non-bleeding internal hemorrhoids were found during                            retroflexion. The hemorrhoids were small.                           The exam was otherwise without abnormality. Complications:            No immediate complications. Estimated Blood Loss:     Estimated blood loss was minimal. Impression:               - Mild diverticulosis in the ascending colon.                           - One 7 mm polyp in the transverse colon, removed                            with a cold snare. Resected and retrieved.                           - Erythematous mucosa in the distal rectum.                            Biopsied.                           - Non-bleeding internal hemorrhoids.                            - The examination was otherwise normal. Recommendation:           - Patient has a contact number available for                            emergencies. The signs and symptoms of potential                            delayed complications were discussed with the                            patient. Return to normal activities tomorrow.                            Written discharge instructions were provided to the                            patient.                           -  Resume previous diet.                           - Continue present medications.                           - Await pathology results.                           - Repeat colonoscopy in 5-10 years for surveillance                            based on pathology results. Mauri Pole, MD 02/11/2019 3:25:02 PM This report has been signed electronically.

## 2019-02-12 ENCOUNTER — Telehealth: Payer: Self-pay | Admitting: *Deleted

## 2019-02-12 NOTE — Telephone Encounter (Signed)
  Follow up Call-  Call back number 02/11/2019  Post procedure Call Back phone  # [phone]  Permission to leave phone message Yes  Some recent data might be hidden     Patient questions:  Do you have a fever, pain , or abdominal swelling? No. Pain Score  0 *  Have you tolerated food without any problems? Yes.    Have you been able to return to your normal activities? Yes.    Do you have any questions about your discharge instructions: Diet   No. Medications  No. Follow up visit  No.  Do you have questions or concerns about your Care? No.  Actions: * If pain score is 4 or above: No action needed, pain <4.

## 2019-02-13 ENCOUNTER — Telehealth: Payer: Self-pay | Admitting: *Deleted

## 2019-02-13 NOTE — Telephone Encounter (Signed)
1. Have you developed a fever since your procedure? No.  2.   Have you had an respiratory symptoms (SOB or cough) since your procedure? No.  3.   Have you tested positive for COVID 19 since your procedure No.  3.   Have you had any family members/close contacts diagnosed with the COVID 19 since your procedure?  No   If any of these questions are a yes, please inquire if patient has been seen by family doctor and route this note to Tarun Patchell Walton, RN. 

## 2019-02-20 ENCOUNTER — Encounter: Payer: Self-pay | Admitting: Family Medicine

## 2019-02-20 ENCOUNTER — Ambulatory Visit (INDEPENDENT_AMBULATORY_CARE_PROVIDER_SITE_OTHER): Payer: 59 | Admitting: Family Medicine

## 2019-02-20 ENCOUNTER — Other Ambulatory Visit: Payer: Self-pay

## 2019-02-20 VITALS — Temp 97.4°F | Ht 66.0 in | Wt 154.5 lb

## 2019-02-20 DIAGNOSIS — M79672 Pain in left foot: Secondary | ICD-10-CM

## 2019-02-20 DIAGNOSIS — M25571 Pain in right ankle and joints of right foot: Secondary | ICD-10-CM

## 2019-02-20 DIAGNOSIS — G8929 Other chronic pain: Secondary | ICD-10-CM | POA: Diagnosis not present

## 2019-02-20 DIAGNOSIS — M79671 Pain in right foot: Secondary | ICD-10-CM | POA: Diagnosis not present

## 2019-02-20 NOTE — Progress Notes (Signed)
Virtual Visit via Video   I connected with patient on 02/20/19 at  2:20 PM EDT by a video enabled telemedicine application and verified that I am speaking with the correct person using two identifiers.  Location patient: Home Location provider: [facility], Office Persons participating in the virtual visit: Patient, Provider, [place] (Jess B)  I discussed the limitations of evaluation and management by telemedicine and the availability of in person appointments. The patient expressed understanding and agreed to proceed.  Interactive audio and video telecommunications were attempted between this provider and patient, however failed, due to patient having technical difficulties OR patient did not have access to video capability.  We continued and completed visit with audio only.   Subjective:   HPI:   Foot pain- 'it's very tight and stiff'.  'very tight around my ankle, my arch, and my toes are really stiff'.  sxs 'for at least a year'.  sxs are bilateral, R>L.  Pt reports there is some swelling.  Pt reports high Na diet.  Limited water intake.  No change in footwear.  No CP, SOB.  Previously on Lasix w/o relief.  Has previously seen Podiatry and was told she likely had a Morton's Neuroma.  Had 2 injxns in feet.  ROS:   See pertinent positives and negatives per HPI.  Patient Active Problem List   Diagnosis Date Noted  . Unilateral osteoarthritis of first carpometacarpal (CMC) joint 10/15/2015  . General medical examination 07/13/2011  . Hyperlipidemia 11/08/2010  . ALLERGIC RHINITIS 11/08/2010  . Osteopenia 11/08/2010    Social History   Tobacco Use  . Smoking status: Never Smoker  . Smokeless tobacco: Never Used  Substance Use Topics  . Alcohol use: No    Current Outpatient Medications:  .  atorvastatin (LIPITOR) 10 MG tablet, TAKE 1 TABLET BY MOUTH  DAILY, Disp: 90 tablet, Rfl: 1 .  cetirizine (ZYRTEC) 10 MG chewable tablet, Chew 1 tablet (10 mg total) by mouth  daily., Disp: 90 tablet, Rfl: 3 .  Cholecalciferol (VITAMIN D-1000 MAX ST) 25 MCG (1000 UT) tablet, Take by mouth., Disp: , Rfl:  .  fluticasone (FLONASE) 50 MCG/ACT nasal spray, Place 2 sprays into both nostrils daily., Disp: 16 g, Rfl: 6 .  glucosamine-chondroitin 500-400 MG tablet, Take 1 tablet by mouth 3 (three) times daily., Disp: , Rfl:  .  latanoprost (XALATAN) 0.005 % ophthalmic solution, Place 1 drop into both eyes at bedtime., Disp: , Rfl:  .  Polyethyl Glycol-Propyl Glycol (SYSTANE OP), Apply to eye., Disp: , Rfl:   Allergies  Allergen Reactions  . Aspirin     REACTION: GI Intolerance  . Ibuprofen     REACTION: GI Intolerance    Objective:   Temp (!) 97.4 F (36.3 C) (Oral)   Ht 5\' 6"  (1.676 m)   Wt 154 lb 8 oz (70.1 kg)   BMI 24.94 kg/m  Pt is able to speak clearly, coherently without shortness of breath or increased work of breathing.  Thought process is linear.  Mood is appropriate.   Assessment and Plan:   Foot pain- ongoing issue for pt.  Not able to assess today b/c her video would not work.  She has seen me for this previously and despite lack of swelling on PE at that time, was given Lasix due to the description of 'tightness' in toes and ankle.  No improvement on Lasix so no need to restart.  Pt has seen podiatry for same concerns in the past and did  not emerge w/ dx or relief.  Given that this is an ongoing issues, encouraged pt to ice, elevate, limit her Na, increase her water and we will refer to a different podiatrist for a 2nd opinion.  Pt expressed understanding and is in agreement w/ plan.     Annye Asa, MD 02/20/2019

## 2019-02-20 NOTE — Progress Notes (Signed)
I have discussed the procedure for the virtual visit with the patient who has given consent to proceed with assessment and treatment.   Jessica L Brodmerkel, CMA     

## 2019-02-27 ENCOUNTER — Encounter: Payer: Self-pay | Admitting: Gastroenterology

## 2019-03-12 ENCOUNTER — Ambulatory Visit (INDEPENDENT_AMBULATORY_CARE_PROVIDER_SITE_OTHER): Payer: 59

## 2019-03-12 ENCOUNTER — Other Ambulatory Visit: Payer: Self-pay | Admitting: Sports Medicine

## 2019-03-12 ENCOUNTER — Encounter: Payer: Self-pay | Admitting: Sports Medicine

## 2019-03-12 ENCOUNTER — Ambulatory Visit: Payer: 59 | Admitting: Sports Medicine

## 2019-03-12 ENCOUNTER — Other Ambulatory Visit: Payer: Self-pay

## 2019-03-12 VITALS — BP 135/80 | HR 63 | Temp 97.7°F | Resp 16

## 2019-03-12 DIAGNOSIS — M2550 Pain in unspecified joint: Secondary | ICD-10-CM

## 2019-03-12 DIAGNOSIS — M79672 Pain in left foot: Secondary | ICD-10-CM

## 2019-03-12 DIAGNOSIS — M1990 Unspecified osteoarthritis, unspecified site: Secondary | ICD-10-CM

## 2019-03-12 DIAGNOSIS — M19079 Primary osteoarthritis, unspecified ankle and foot: Secondary | ICD-10-CM

## 2019-03-12 DIAGNOSIS — M25571 Pain in right ankle and joints of right foot: Secondary | ICD-10-CM | POA: Diagnosis not present

## 2019-03-12 DIAGNOSIS — M79671 Pain in right foot: Secondary | ICD-10-CM | POA: Diagnosis not present

## 2019-03-12 DIAGNOSIS — M2560 Stiffness of unspecified joint, not elsewhere classified: Secondary | ICD-10-CM

## 2019-03-12 NOTE — Progress Notes (Signed)
Subjective: Courtney Calhoun is a 58 y.o. female patient who presents to office for evaluation of R>L foot/ankle stiffness. Patient reports that she is not in any pain but moreso stiffness 1st in the morning as well as some swelling at times at right>left ankle. Patient reports that when she does Zumba that helps but has been stretching and tried insoles and had an injection in the past with no relief for a mortons neuroma. Patient reports that she wears good supportive shoes and has pain at right knee and hip as well. Patient denies any injury or trauma. No other issues.  Review of Systems  Musculoskeletal: Positive for joint pain.       Stiffness  All other systems reviewed and are negative.     Patient Active Problem List   Diagnosis Date Noted  . Unilateral osteoarthritis of first carpometacarpal (CMC) joint 10/15/2015  . General medical examination 07/13/2011  . Hyperlipidemia 11/08/2010  . ALLERGIC RHINITIS 11/08/2010  . Osteopenia 11/08/2010    Current Outpatient Medications on File Prior to Visit  Medication Sig Dispense Refill  . atorvastatin (LIPITOR) 10 MG tablet TAKE 1 TABLET BY MOUTH  DAILY 90 tablet 1  . cetirizine (ZYRTEC) 10 MG chewable tablet Chew 1 tablet (10 mg total) by mouth daily. 90 tablet 3  . Cholecalciferol (VITAMIN D-1000 MAX ST) 25 MCG (1000 UT) tablet Take by mouth.    . fluticasone (FLONASE) 50 MCG/ACT nasal spray Place 2 sprays into both nostrils daily. 16 g 6  . glucosamine-chondroitin 500-400 MG tablet Take 1 tablet by mouth 3 (three) times daily.    [name] latanoprost (XALATAN) 0.005 % ophthalmic solution Place 1 drop into both eyes at bedtime.    Vladimir Faster Glycol-Propyl Glycol (SYSTANE OP) Apply to eye.     No current facility-administered medications on file prior to visit.     Allergies  Allergen Reactions  . Aspirin     REACTION: GI Intolerance  . Ibuprofen     REACTION: GI Intolerance    Objective:  General: Alert and oriented x3 in no acute  distress  Dermatology: No open lesions bilateral lower extremities, no webspace macerations, no ecchymosis bilateral, all nails x 10 are well manicured.  Vascular: Dorsalis Pedis and Posterior Tibial pedal pulses palpable, Capillary Fill Time 3 seconds,(+) pedal hair growth bilateral, no edema bilateral lower extremities, Temperature gradient within normal limits.Subhjective swelling at arches>ankle likely from EDB muscle belly.   Neurology: Johney Maine sensation intact via light touch bilateral.  Musculoskeletal: No pain with palpation to right or left foot. There is decreased ankle rom with knee extending  vs flexed resembling gastroc equnius bilateral, Subtalar joint range of motion is within normal limits, there is no 1st ray hypermobility noted bilateral, decreased 1st MPJ rom Right>Left with functional limitus noted on weightbearing exam. + 5th hammertoe. Strength within normal limits in all groups bilateral.   Gait: Antalgic gait  Xrays  Right Foot/ankle and Left foot: joint space narrowing at ankle on right with mild midfoot OA with soft tissue swelling minimal in nature. No other acute findings.   Assessment and Plan: Problem List Items Addressed This Visit    None    Visit Diagnoses    Acute right ankle pain    -  Primary   Relevant Orders   DG Ankle Complete Right   Pain in both feet       Relevant Orders   DG Foot Complete Left   DG Foot Complete Right   Arthralgia,  unspecified joint       Stiffness in joint       Arthritis of foot           -Complete examination performed -Xrays reviewed -Discussed treatement options for likely early arthritis with MSK stiffness -Recommend OTC joint health suppliment like move ease or glucosamine -Recommend daily stretches and good supportive shoes  -Patient declined PT at this time -Patient to return to office as needed or sooner if condition worsens.  Landis Martins, DPM

## 2019-05-21 ENCOUNTER — Encounter: Payer: Self-pay | Admitting: Family Medicine

## 2019-05-23 ENCOUNTER — Encounter: Payer: Self-pay | Admitting: Physician Assistant

## 2019-05-23 ENCOUNTER — Other Ambulatory Visit: Payer: Self-pay

## 2019-05-23 ENCOUNTER — Ambulatory Visit: Payer: 59 | Admitting: Physician Assistant

## 2019-05-23 VITALS — BP 138/72 | HR 70 | Temp 98.2°F | Resp 14 | Ht 66.0 in | Wt 156.0 lb

## 2019-05-23 DIAGNOSIS — H6981 Other specified disorders of Eustachian tube, right ear: Secondary | ICD-10-CM

## 2019-05-23 DIAGNOSIS — I959 Hypotension, unspecified: Secondary | ICD-10-CM | POA: Diagnosis not present

## 2019-05-23 DIAGNOSIS — H6121 Impacted cerumen, right ear: Secondary | ICD-10-CM

## 2019-05-23 LAB — CBC WITH DIFFERENTIAL/PLATELET
Basophils Absolute: 0 10*3/uL (ref 0.0–0.1)
Basophils Relative: 0.6 % (ref 0.0–3.0)
Eosinophils Absolute: 0 10*3/uL (ref 0.0–0.7)
Eosinophils Relative: 1.2 % (ref 0.0–5.0)
HCT: 39.9 % (ref 36.0–46.0)
Hemoglobin: 13.3 g/dL (ref 12.0–15.0)
Lymphocytes Relative: 39.7 % (ref 12.0–46.0)
Lymphs Abs: 1.3 10*3/uL (ref 0.7–4.0)
MCHC: 33.4 g/dL (ref 30.0–36.0)
MCV: 83.6 fl (ref 78.0–100.0)
Monocytes Absolute: 0.4 10*3/uL (ref 0.1–1.0)
Monocytes Relative: 10.5 % (ref 3.0–12.0)
Neutro Abs: 1.6 10*3/uL (ref 1.4–7.7)
Neutrophils Relative %: 48 % (ref 43.0–77.0)
Platelets: 155 10*3/uL (ref 150.0–400.0)
RBC: 4.77 Mil/uL (ref 3.87–5.11)
RDW: 15.4 % (ref 11.5–15.5)
WBC: 3.4 10*3/uL — ABNORMAL LOW (ref 4.0–10.5)

## 2019-05-23 LAB — COMPREHENSIVE METABOLIC PANEL
ALT: 12 U/L (ref 0–35)
AST: 13 U/L (ref 0–37)
Albumin: 4.4 g/dL (ref 3.5–5.2)
Alkaline Phosphatase: 64 U/L (ref 39–117)
BUN: 11 mg/dL (ref 6–23)
CO2: 26 mEq/L (ref 19–32)
Calcium: 9.3 mg/dL (ref 8.4–10.5)
Chloride: 106 mEq/L (ref 96–112)
Creatinine, Ser: 0.84 mg/dL (ref 0.40–1.20)
GFR: 84.27 mL/min (ref >60.00)
Glucose, Bld: 86 mg/dL (ref 70–99)
Potassium: 3.9 mEq/L (ref 3.5–5.1)
Sodium: 140 mEq/L (ref 135–145)
Total Bilirubin: 1.3 mg/dL — ABNORMAL HIGH (ref 0.2–1.2)
Total Protein: 6.8 g/dL (ref 6.0–8.3)

## 2019-05-23 MED ORDER — LEVOCETIRIZINE DIHYDROCHLORIDE 5 MG PO TABS: 5.0000 mg | ORAL_TABLET | Freq: Every evening | ORAL | 1 refills | Status: DC

## 2019-05-23 NOTE — Progress Notes (Signed)
Patient presents to clinic today for assessment of some low BP readings she has been getting at home. Patient endorses checking BP intermittently over the past few weeks and noting BP low in the 36-644 systolic. Bought a new BP cuff as she thought an old cuff might have been the issue. Still was noting same BP measureements. Overall has felt ok but with some occasional episodes of lightheadedness. Denies dizziness, chest pain, vision changes. Denies SOBOE or at rest. Has remote history of anemia, she cannot remember what kind.   Lab Results  Component Value Date   HGB 13.9 10/23/2017   Patient notes she cut out all sodas and teas for diet and weight loss. But does not like water so she does not drink much at all during the day. Has increased fluid intake the past few days noting improvement in her BP levels.   Patient also endorses intermittent pressure and popping of her R ear with some change in hearing noted. Denies ear pain presently. Denies drainage from the ear. Has history of allergic rhinitis and ETD for which she is prescribed Zyrtec and Flonase. Takes zyrtec daily without much change. Is not using her Flonase.   Past Medical History:  Diagnosis Date  . Allergy   . Anemia   . Anxiety   . Depression   . Gilbert's syndrome   . Glaucoma   . Hematuria   . Hyperlipidemia   . Osteopenia     Current Outpatient Medications on File Prior to Visit  Medication Sig Dispense Refill  . atorvastatin (LIPITOR) 10 MG tablet TAKE 1 TABLET BY MOUTH  DAILY 90 tablet 1  . calcium carbonate (OSCAL) 1500 (600 Ca) MG TABS tablet Take 1,500 mg by mouth 2 (two) times daily with a meal.    . cetirizine (ZYRTEC) 10 MG chewable tablet Chew 1 tablet (10 mg total) by mouth daily. 90 tablet 3  . Cholecalciferol (VITAMIN D-1000 MAX ST) 25 MCG (1000 UT) tablet Take by mouth.    . latanoprost (XALATAN) 0.005 % ophthalmic solution Place 1 drop into both eyes at bedtime.    Vladimir Faster Glycol-Propyl Glycol  (SYSTANE OP) Apply to eye.    . fluticasone (FLONASE) 50 MCG/ACT nasal spray Place 2 sprays into both nostrils daily. (Patient not taking: Reported on 05/23/2019) 16 g 6   No current facility-administered medications on file prior to visit.     Allergies  Allergen Reactions  . Aspirin     REACTION: GI Intolerance  . Ibuprofen     REACTION: GI Intolerance    Family History  Problem Relation Age of Onset  . Hypertension Mother   . Cancer Maternal Aunt        colon  . Stroke Maternal Grandmother   . Hyperlipidemia Paternal Grandfather   . Colon cancer Cousin   . Colon polyps Neg Hx   . Esophageal cancer Neg Hx   . Rectal cancer Neg Hx   . Stomach cancer Neg Hx     Social History   Socioeconomic History  . Marital status: Single    Spouse name: Not on file  . Number of children: 0  . Years of education: Not on file  . Highest education level: Not on file  Occupational History  . Occupation: Product/process development scientist for [employer]  . Financial resource strain: Not on file  . Food insecurity    Worry: Not on file    Inability: Not on file  . Transportation needs  Medical: Not on file    Non-medical: Not on file  Tobacco Use  . Smoking status: Never Smoker  . Smokeless tobacco: Never Used  Substance and Sexual Activity  . Alcohol use: No  . Drug use: No  . Sexual activity: Not on file  Lifestyle  . Physical activity    Days per week: Not on file    Minutes per session: Not on file  . Stress: Not on file  Relationships  . Social Herbalist on phone: Not on file    Gets together: Not on file    Attends religious service: Not on file    Active member of club or organization: Not on file    Attends meetings of clubs or organizations: Not on file    Relationship status: Not on file  Other Topics Concern  . Not on file  Social History Narrative  . Not on file   Review of Systems - See HPI.  All other ROS are negative.  Resp 14   Ht '5\' 6"'  (1.676 m)    Wt 156 lb (70.8 kg)   BMI 25.18 kg/m   Physical Exam Vitals signs reviewed.  Constitutional:      Appearance: Normal appearance.  HENT:     Head: Normocephalic and atraumatic.     Right Ear: A middle ear effusion (serous) is present. There is impacted cerumen. Tympanic membrane is retracted (very mild).     Left Ear: Tympanic membrane and ear canal normal.     Nose: Nose normal.     Mouth/Throat:     Mouth: Mucous membranes are moist.  Eyes:     Conjunctiva/sclera: Conjunctivae normal.     Pupils: Pupils are equal, round, and reactive to light.  Neck:     Musculoskeletal: Neck supple.  Cardiovascular:     Rate and Rhythm: Normal rate and regular rhythm.     Pulses: Normal pulses.     Heart sounds: Normal heart sounds.  Pulmonary:     Effort: Pulmonary effort is normal.     Breath sounds: Normal breath sounds.  Neurological:     General: No focal deficit present.     Mental Status: She is alert and oriented to person, place, and time.  Psychiatric:        Mood and Affect: Mood normal.    Assessment/Plan: 1. Hypotension, unspecified hypotension type BP normotensive here in office. Asymptomatic. Suspect related to hypovolemia as she was not hydrating well at all. Will check CBC to check for true anemia and if her RBC indices are abnormal to suspect iron def or other cause. Will check CMP today. Increase fluids. Monitor at home.  - CBC w/Diff - Comp Met (CMET)  2. Eustachian tube dysfunction, right Restart Flonase. Begin Xyzal. Follow-up if not improving.  3. Impacted cerumen of right ear Unable to remove with curette. Removed with lavage. Home measures reviewed.    Leeanne Rio, PA-C

## 2019-05-23 NOTE — Patient Instructions (Signed)
Please go to the lab today for blood work.  I will call you with your results. We will alter treatment regimen(s) if indicated by your results.   I want you to continue work on fluid intake. Try adding some sugar free flavorings or fruit to your water to help with the taste. Continue a well-balanced diet. Try to avoid skipping meals. Keep check on BP at home over the next 2 weeks to ensure it continues to be at a better range. If you note it is lowering again, let us know.   Stop your generic zyrtec at home. Start the Xyzal once daily (I have sent in a prescription). Restart your Flonase with this taking daily as directed.  Let us know if the ear symptoms continue.    Earwax Buildup, Adult The ears produce a substance called earwax that helps keep bacteria out of the ear and protects the skin in the ear canal. Occasionally, earwax can build up in the ear and cause discomfort or hearing loss. What increases the risk? This condition is more likely to develop in people who:  Are female.  Are elderly.  Naturally produce more earwax.  Clean their ears often with cotton swabs.  Use earplugs often.  Use in-ear headphones often.  Wear hearing aids.  Have narrow ear canals.  Have earwax that is overly thick or sticky.  Have eczema.  Are dehydrated.  Have excess hair in the ear canal. What are the signs or symptoms? Symptoms of this condition include:  Reduced or muffled hearing.  A feeling of fullness in the ear or feeling that the ear is plugged.  Fluid coming from the ear.  Ear pain.  Ear itch.  Ringing in the ear.  Coughing.  An obvious piece of earwax that can be seen inside the ear canal. How is this diagnosed? This condition may be diagnosed based on:  Your symptoms.  Your medical history.  An ear exam. During the exam, your health care provider will look into your ear with an instrument called an otoscope. You may have tests, including a hearing test.  How is this treated? This condition may be treated by:  Using ear drops to soften the earwax.  Having the earwax removed by a health care provider. The health care provider may: ? Flush the ear with water. ? Use an instrument that has a loop on the end (curette). ? Use a suction device.  Surgery to remove the wax buildup. This may be done in severe cases. Follow these instructions at home:   Take over-the-counter and prescription medicines only as told by your health care provider.  Do not put any objects, including cotton swabs, into your ear. You can clean the opening of your ear canal with a washcloth or facial tissue.  Follow instructions from your health care provider about cleaning your ears. Do not over-clean your ears.  Drink enough fluid to keep your urine clear or pale yellow. This will help to thin the earwax.  Keep all follow-up visits as told by your health care provider. If earwax builds up in your ears often or if you use hearing aids, consider seeing your health care provider for routine, preventive ear cleanings. Ask your health care provider how often you should schedule your cleanings.  If you have hearing aids, clean them according to instructions from the manufacturer and your health care provider. Contact a health care provider if:  You have ear pain.  You develop a fever.  You  have blood, pus, or other fluid coming from your ear.  You have hearing loss.  You have ringing in your ears that does not go away.  Your symptoms do not improve with treatment.  You feel like the room is spinning (vertigo). Summary  Earwax can build up in the ear and cause discomfort or hearing loss.  The most common symptoms of this condition include reduced or muffled hearing and a feeling of fullness in the ear or feeling that the ear is plugged.  This condition may be diagnosed based on your symptoms, your medical history, and an ear exam.  This condition may be  treated by using ear drops to soften the earwax or by having the earwax removed by a health care provider.  Do not put any objects, including cotton swabs, into your ear. You can clean the opening of your ear canal with a washcloth or facial tissue. This information is not intended to replace advice given to you by your health care provider. Make sure you discuss any questions you have with your health care provider. Document Released: 10/27/2004 Document Revised: 09/01/2017 Document Reviewed: 11/30/2016 Elsevier Patient Education  2020 [employer].

## 2019-07-01 LAB — HM MAMMOGRAPHY

## 2019-07-01 LAB — HM DEXA SCAN

## 2019-07-09 ENCOUNTER — Encounter: Payer: Self-pay | Admitting: General Practice

## 2019-07-25 ENCOUNTER — Other Ambulatory Visit: Payer: Self-pay | Admitting: Physician Assistant

## 2019-07-26 ENCOUNTER — Other Ambulatory Visit: Payer: Self-pay | Admitting: Family Medicine

## 2019-08-22 ENCOUNTER — Emergency Department (HOSPITAL_BASED_OUTPATIENT_CLINIC_OR_DEPARTMENT_OTHER)
Admission: EM | Admit: 2019-08-22 | Discharge: 2019-08-22 | Disposition: A | Discharge status: Home / Self Care | Payer: 59 | Attending: Emergency Medicine | Admitting: Emergency Medicine

## 2019-08-22 ENCOUNTER — Other Ambulatory Visit: Payer: Self-pay

## 2019-08-22 ENCOUNTER — Encounter (HOSPITAL_BASED_OUTPATIENT_CLINIC_OR_DEPARTMENT_OTHER): Payer: Self-pay | Admitting: *Deleted

## 2019-08-22 ENCOUNTER — Emergency Department (HOSPITAL_BASED_OUTPATIENT_CLINIC_OR_DEPARTMENT_OTHER): Payer: 59

## 2019-08-22 DIAGNOSIS — R785 Finding of other psychotropic drug in blood: Secondary | ICD-10-CM | POA: Insufficient documentation

## 2019-08-22 DIAGNOSIS — R002 Palpitations: Secondary | ICD-10-CM | POA: Diagnosis present

## 2019-08-22 DIAGNOSIS — Z79899 Other long term (current) drug therapy: Secondary | ICD-10-CM | POA: Insufficient documentation

## 2019-08-22 DIAGNOSIS — Z886 Allergy status to analgesic agent status: Secondary | ICD-10-CM | POA: Diagnosis not present

## 2019-08-22 LAB — CBC WITH DIFFERENTIAL/PLATELET
Abs Immature Granulocytes: 0 10*3/uL (ref 0.00–0.07)
Basophils Absolute: 0 10*3/uL (ref 0.0–0.1)
Basophils Relative: 0 %
Eosinophils Absolute: 0 10*3/uL (ref 0.0–0.5)
Eosinophils Relative: 1 %
HCT: 38.5 % (ref 36.0–46.0)
Hemoglobin: 13.5 g/dL (ref 12.0–15.0)
Immature Granulocytes: 0 %
Lymphocytes Relative: 29 %
Lymphs Abs: 1.4 10*3/uL (ref 0.7–4.0)
MCH: 27.9 pg (ref 26.0–34.0)
MCHC: 35.1 g/dL (ref 30.0–36.0)
MCV: 79.5 fL — ABNORMAL LOW (ref 80.0–100.0)
Monocytes Absolute: 0.5 10*3/uL (ref 0.1–1.0)
Monocytes Relative: 10 %
Neutro Abs: 2.9 10*3/uL (ref 1.7–7.7)
Neutrophils Relative %: 60 %
Platelets: 168 10*3/uL (ref 150–400)
RBC: 4.84 MIL/uL (ref 3.87–5.11)
RDW: 14.6 % (ref 11.5–15.5)
WBC: 4.9 10*3/uL (ref 4.0–10.5)
nRBC: 0 % (ref 0.0–0.2)

## 2019-08-22 LAB — BASIC METABOLIC PANEL
Anion gap: 8 (ref 5–15)
BUN: 14 mg/dL (ref 6–20)
CO2: 23 mmol/L (ref 22–32)
Calcium: 9.5 mg/dL (ref 8.9–10.3)
Chloride: 107 mmol/L (ref 98–111)
Creatinine, Ser: 0.87 mg/dL (ref 0.44–1.00)
GFR calc Af Amer: >60 mL/min (ref >60)
GFR calc non Af Amer: >60 mL/min (ref >60)
Glucose, Bld: 121 mg/dL — ABNORMAL HIGH (ref 70–99)
Potassium: 3.7 mmol/L (ref 3.5–5.1)
Sodium: 138 mmol/L (ref 135–145)

## 2019-08-22 LAB — MAGNESIUM: Magnesium: 2.2 mg/dL (ref 1.7–2.4)

## 2019-08-22 LAB — TROPONIN I (HIGH SENSITIVITY): Troponin I (High Sensitivity): 3 ng/L (ref <18)

## 2019-08-22 NOTE — ED Provider Notes (Signed)
[facility] Provider Note   CSN: [id number] Arrival date & time: 08/22/19  2110     History   Chief Complaint Chief Complaint  Patient presents with   Palpitations    HPI QUINNETTA MACEACHERN is a 58 y.o. female.  Presents here with palpitations.  Reports for the last few hours she is been having episodes of palpitations, states seem to come and go at random, last for a few seconds then go away.  Currently not having symptoms.  No associated chest pain, difficulty breathing.  States she has had episodes of palpitations previously and had a Holter monitor done by her primary doctor and was told that this was normal.    HPI  Past Medical History:  Diagnosis Date   Allergy    Anemia    Anxiety    Depression    Gilbert's syndrome    Glaucoma    Hematuria    Hyperlipidemia    Osteopenia     Patient Active Problem List   Diagnosis Date Noted   Unilateral osteoarthritis of first carpometacarpal ([place]) joint 10/15/2015   General medical examination 07/13/2011   Hyperlipidemia 11/08/2010   ALLERGIC RHINITIS 11/08/2010   Osteopenia 11/08/2010    Past Surgical History:  Procedure Laterality Date   CHOLECYSTECTOMY     EYE SURGERY     INNER EAR SURGERY     WISDOM TOOTH EXTRACTION       OB History   No obstetric history on file.      Home Medications    Prior to Admission medications   Medication Sig Start Date End Date Taking? Authorizing Provider  atorvastatin (LIPITOR) 10 MG tablet TAKE 1 TABLET BY MOUTH  DAILY 07/26/19   Midge Minium, MD  calcium carbonate (OSCAL) 1500 (600 Ca) MG TABS tablet Take 1,500 mg by mouth 2 (two) times daily with a meal.    [provider]  Cholecalciferol (VITAMIN D-1000 MAX ST) 25 MCG (1000 UT) tablet Take by mouth.    [provider]  fluticasone (FLONASE) 50 MCG/ACT nasal spray Place 2 sprays into both nostrils daily. Patient not taking: Reported on 05/23/2019  10/11/18   Midge Minium, MD  latanoprost (XALATAN) 0.005 % ophthalmic solution Place 1 drop into both eyes at bedtime. 12/08/12   [provider]  levocetirizine (XYZAL) 5 MG tablet TAKE 1 TABLET BY MOUTH EVERY DAY IN THE EVENING 07/25/19   Midge Minium, MD  Multiple Vitamins-Minerals (AIRBORNE) CHEW Chew 1 tablet by mouth daily.    [provider]  Polyethyl Glycol-Propyl Glycol (SYSTANE OP) Apply to eye.    [provider]    Family History Family History  Problem Relation Age of Onset   Hypertension Mother    Cancer Maternal Aunt        colon   Stroke Maternal Grandmother    Hyperlipidemia Paternal Grandfather    Colon cancer Cousin    Colon polyps Neg Hx    Esophageal cancer Neg Hx    Rectal cancer Neg Hx    Stomach cancer Neg Hx     Social History Social History   Tobacco Use   Smoking status: Never Smoker   Smokeless tobacco: Never Used  Substance Use Topics   Alcohol use: No   Drug use: No     Allergies   Aspirin and Ibuprofen   Review of Systems Review of Systems  Constitutional: Negative for chills and fever.  HENT: Negative for ear pain  and sore throat.   Eyes: Negative for pain and visual disturbance.  Respiratory: Negative for cough and shortness of breath.   Cardiovascular: Positive for palpitations. Negative for chest pain.  Gastrointestinal: Negative for abdominal pain and vomiting.  Genitourinary: Negative for dysuria and hematuria.  Musculoskeletal: Negative for arthralgias and back pain.  Skin: Negative for color change and rash.  Neurological: Negative for seizures and syncope.  All other systems reviewed and are negative.    Physical Exam Updated Vital Signs BP 126/82    Pulse 68    Temp 97.8 F (36.6 C) (Oral)    Resp 14    Ht 5\' 6"  (1.676 m)    Wt 68 kg    SpO2 99%    BMI 24.21 kg/m   Physical Exam Vitals signs and nursing note reviewed.  Constitutional:      General: She is not in  acute distress.    Appearance: She is well-developed.  HENT:     Head: Normocephalic and atraumatic.  Eyes:     Conjunctiva/sclera: Conjunctivae normal.  Neck:     Musculoskeletal: Neck supple.  Cardiovascular:     Rate and Rhythm: Normal rate and regular rhythm.     Heart sounds: No murmur.  Pulmonary:     Effort: Pulmonary effort is normal. No respiratory distress.     Breath sounds: Normal breath sounds.  Abdominal:     Palpations: Abdomen is soft.     Tenderness: There is no abdominal tenderness.  Skin:    General: Skin is warm and dry.  Neurological:     General: No focal deficit present.     Mental Status: She is alert and oriented to person, place, and time.  Psychiatric:        Mood and Affect: Mood normal.        Behavior: Behavior normal.      ED Treatments / Results  Labs (all labs ordered are listed, but only abnormal results are displayed) Labs Reviewed  CBC WITH DIFFERENTIAL/PLATELET - Abnormal; Notable for the following components:      Result Value   MCV 79.5 (*)    All other components within normal limits  BASIC METABOLIC PANEL - Abnormal; Notable for the following components:   Glucose, Bld 121 (*)    All other components within normal limits  MAGNESIUM  TROPONIN I (HIGH SENSITIVITY)    EKG EKG Interpretation  Date/Time:  Thursday August 22 2019 21:24:12 EST Ventricular Rate:  86 PR Interval:  168 QRS Duration: 74 QT Interval:  350 QTC Calculation: 418 R Axis:   73 Text Interpretation: Normal sinus rhythm with sinus arrhythmia Normal ECG Confirmed by Madalyn Rob [phone]) on 08/22/2019 9:50:12 PM   Radiology Dg Chest 2 View  Result Date: 08/22/2019 CLINICAL DATA:  Chest pain and palpitations. EXAM: CHEST - 2 VIEW COMPARISON:  None. FINDINGS: The cardiomediastinal contours are normal. Subsegmental atelectasis in the lung bases. Pulmonary vasculature is normal. No consolidation, pleural effusion, or pneumothorax. No acute osseous  abnormalities are seen. Broad-based dextroscoliotic curvature of the thoracic spine. IMPRESSION: Subsegmental atelectasis in the lung bases. Otherwise no acute abnormality. Electronically Signed   By: Keith Rake M.D.   On: 08/22/2019 22:14    Procedures Procedures (including critical care time)  Medications Ordered in ED Medications - No data to display   Initial Impression / Assessment and Plan / ED Course  I have reviewed the triage vital signs and the nursing notes.  Pertinent labs & imaging results  that were available during my care of the patient were reviewed by me and considered in my medical decision making (see chart for details).        58 year old lady presents with palpitations.  Here well-appearing, no ongoing symptoms, no arrhythmias noted on telemetry monitoring here.  EKG unremarkable.  Labs within normal limits, chest x-ray negative.  Troponin normal limits.  Patient remains well-appearing on reassessment, given above work-up, believe patient can be discharged and managed further as outpatient.  Recommended recheck and follow-up with her primary doctor.  May need Holter monitor if continues to have symptoms.  Reviewed precautions, will discharge home.    After the discussed management above, the patient was determined to be safe for discharge.  The patient was in agreement with this plan and all questions regarding their care were answered.  ED return precautions were discussed and the patient will return to the ED with any significant worsening of condition.   Final Clinical Impressions(s) / ED Diagnoses   Final diagnoses:  Palpitations    ED Discharge Orders    None       Lucrezia Starch, MD 08/23/19 0009

## 2019-08-22 NOTE — Discharge Instructions (Signed)
Please schedule follow-up appoint with your primary doctor regarding symptoms you are experiencing today.  If you continue to have palpitations, you likely need further testing.  If you feel that your heart is racing, you develop any chest pain, difficulty breathing or other new concern symptom recommend returning to ER for reassessment.

## 2019-08-22 NOTE — ED Notes (Signed)
ED Provider at bedside. 

## 2019-08-22 NOTE — ED Triage Notes (Addendum)
Pt c/o palpitations x 5 hrs " off and on" denies sob or nausea

## 2019-08-22 NOTE — ED Notes (Signed)
Patient transported to X-ray 

## 2019-08-26 ENCOUNTER — Ambulatory Visit (INDEPENDENT_AMBULATORY_CARE_PROVIDER_SITE_OTHER): Payer: 59 | Admitting: Family Medicine

## 2019-08-26 ENCOUNTER — Encounter: Payer: Self-pay | Admitting: Family Medicine

## 2019-08-26 ENCOUNTER — Other Ambulatory Visit: Payer: Self-pay

## 2019-08-26 VITALS — BP 129/80 | HR 78 | Temp 97.9°F | Resp 17 | Ht 66.0 in | Wt 157.2 lb

## 2019-08-26 DIAGNOSIS — G4700 Insomnia, unspecified: Secondary | ICD-10-CM | POA: Diagnosis not present

## 2019-08-26 DIAGNOSIS — K219 Gastro-esophageal reflux disease without esophagitis: Secondary | ICD-10-CM | POA: Diagnosis not present

## 2019-08-26 DIAGNOSIS — E785 Hyperlipidemia, unspecified: Secondary | ICD-10-CM

## 2019-08-26 DIAGNOSIS — R002 Palpitations: Secondary | ICD-10-CM

## 2019-08-26 DIAGNOSIS — M8580 Other specified disorders of bone density and structure, unspecified site: Secondary | ICD-10-CM

## 2019-08-26 DIAGNOSIS — Z0000 Encounter for general adult medical examination without abnormal findings: Secondary | ICD-10-CM

## 2019-08-26 LAB — BASIC METABOLIC PANEL
BUN: 10 mg/dL (ref 6–23)
CO2: 28 mEq/L (ref 19–32)
Calcium: 9.3 mg/dL (ref 8.4–10.5)
Chloride: 106 mEq/L (ref 96–112)
Creatinine, Ser: 0.85 mg/dL (ref 0.40–1.20)
GFR: 83.05 mL/min (ref >60.00)
Glucose, Bld: 84 mg/dL (ref 70–99)
Potassium: 4 mEq/L (ref 3.5–5.1)
Sodium: 141 mEq/L (ref 135–145)

## 2019-08-26 LAB — HEPATIC FUNCTION PANEL
ALT: 10 U/L (ref 0–35)
AST: 13 U/L (ref 0–37)
Albumin: 4 g/dL (ref 3.5–5.2)
Alkaline Phosphatase: 60 U/L (ref 39–117)
Bilirubin, Direct: 0.3 mg/dL (ref 0.0–0.3)
Total Bilirubin: 1.8 mg/dL — ABNORMAL HIGH (ref 0.2–1.2)
Total Protein: 6.9 g/dL (ref 6.0–8.3)

## 2019-08-26 LAB — LIPID PANEL
Cholesterol: 188 mg/dL (ref 0–200)
HDL: 96.4 mg/dL (ref >39.00)
LDL Cholesterol: 84 mg/dL (ref 0–99)
NonHDL: 91.44
Total CHOL/HDL Ratio: 2
Triglycerides: 39 mg/dL (ref 0.0–149.0)
VLDL: 7.8 mg/dL (ref 0.0–40.0)

## 2019-08-26 LAB — VITAMIN D 25 HYDROXY (VIT D DEFICIENCY, FRACTURES): VITD: 52.57 ng/mL (ref 30.00–100.00)

## 2019-08-26 LAB — TSH: TSH: 1.22 u[IU]/mL (ref 0.35–4.50)

## 2019-08-26 MED ORDER — LORATADINE 10 MG PO TABS: 10.0000 mg | ORAL_TABLET | Freq: Every day | ORAL | 11 refills | Status: DC

## 2019-08-26 MED ORDER — OMEPRAZOLE 20 MG PO CPDR: 20.0000 mg | DELAYED_RELEASE_CAPSULE | Freq: Every day | ORAL | 3 refills | Status: DC

## 2019-08-26 MED ORDER — TRAZODONE HCL 50 MG PO TABS: 25.0000 mg | ORAL_TABLET | Freq: Every evening | ORAL | 3 refills | Status: DC | PRN

## 2019-08-26 NOTE — Assessment & Plan Note (Signed)
New.  Will start low dose acid reducer.  Reviewed lifestyle modifications that could improve sxs.  Will follow.

## 2019-08-26 NOTE — Progress Notes (Signed)
   Subjective:    Patient ID: Courtney Calhoun, female    DOB: 08-02-1961, 58 y.o.   MRN: [id number]  HPI CPE- UTD on colonoscopy, pap, mammo.  UTD on immunizations.  This visit occurred during the SARS-CoV-2 public health emergency.  Safety protocols were in place, including screening questions prior to the visit, additional usage of staff PPE, and extensive cleaning of exam room while observing appropriate contact time as indicated for disinfecting solutions.   Review of Systems Patient reports no vision/ hearing changes, adenopathy,fever, weight change,  persistant/recurrent hoarseness , swallowing issues, chest pain, edema, persistant/recurrent cough, hemoptysis, dyspnea (rest/exertional/paroxysmal nocturnal), gastrointestinal bleeding (melena, rectal bleeding), abdominal pain, bowel changes, GU symptoms (dysuria, hematuria, incontinence), Gyn symptoms (abnormal  bleeding, pain),  syncope, focal weakness, memory loss, numbness & tingling, skin/hair/nail changes, abnormal bruising or bleeding, anxiety, or depression.   'for the past week, my heart is beating really really hard'- went to ER 11/19. Has stopped xyzal and calcium w/o improvement.  sxs improve w/ increased rest.  Avoids caffeine w/ exception of chocolate.  Insomnia- difficulty staying asleep.  Able to fall asleep but not sleep longer than 5 hrs/night.  Has not tried OTC sleep aides.  Is napping regularly.  + GERD- 'feels like something stuck right here.  Like I need to burp'.  Some relief w/ Tums.    Objective:   Physical Exam General Appearance:    Alert, cooperative, no distress, appears stated age  Head:    Normocephalic, without obvious abnormality, atraumatic  Eyes:    PERRL, conjunctiva/corneas clear, EOM's intact, fundi    benign, both eyes  Ears:    Normal TM's and external ear canals, both ears  Nose:   Deferred due to COVID  Throat:   Neck:   Supple, symmetrical, trachea midline, no adenopathy;    Thyroid: no  enlargement/tenderness/nodules  Back:     Symmetric, no curvature, ROM normal, no CVA tenderness  Lungs:     Clear to auscultation bilaterally, respirations unlabored  Chest Wall:    No tenderness or deformity   Heart:    Regular rate and rhythm, S1 and S2 normal, no murmur, rub   or gallop  Breast Exam:    Deferred to GYN  Abdomen:     Soft, non-tender, bowel sounds active all four quadrants,    no masses, no organomegaly  Genitalia:    Deferred to GYN  Rectal:    Extremities:   Extremities normal, atraumatic, no cyanosis or edema  Pulses:   2+ and symmetric all extremities  Skin:   Skin color, texture, turgor normal, no rashes or lesions  Lymph nodes:   Cervical, supraclavicular, and axillary nodes normal  Neurologic:   CNII-XII intact, normal strength, sensation and reflexes    throughout          Assessment & Plan:

## 2019-08-26 NOTE — Assessment & Plan Note (Signed)
Chronic problem.  UTD on DEXA.  Check Vit D level and replete prn. 

## 2019-08-26 NOTE — Assessment & Plan Note (Signed)
New.  Pt reports no trouble falling asleep but difficulty staying asleep.  She admits to high stress levels and I suspect this is anxiety/depression driven.  Start Trazodone and monitor for improvement.  Pt expressed understanding and is in agreement w/ plan.

## 2019-08-26 NOTE — Assessment & Plan Note (Signed)
Chronic problem.  Tolerating statin w/o difficulty.  Check labs.  Adjust meds prn  

## 2019-08-26 NOTE — Assessment & Plan Note (Addendum)
Pt has hx of similar and has previously worn a holter monitor.  Went to ER last week and had normal work up.  Reviewed ER notes and labs. Discussed that her palpitations may be stress related as they improve when she gets more sleep.  Hopefully starting Trazodone will improve her sleep and consequently her palpitations.  Will follow.

## 2019-08-26 NOTE — Patient Instructions (Addendum)
Follow up in 6 months to recheck cholesterol We'll notify you of your lab results and make any changes if needed Continue to work on healthy diet and regular exercise- you're doing great! START the Trazodone at night for sleep START the Omeprazole daily for reflux TAKE the Loratadine daily for allergies Call with any questions or concerns Stay safe!  Stay healthy! Happy Holidays!

## 2019-08-26 NOTE — Assessment & Plan Note (Signed)
Pt's PE WNL.  UTD on GYN, colonoscopy, immunizations.  Check labs.  Anticipatory guidance provided.  

## 2019-10-30 DIAGNOSIS — M25551 Pain in right hip: Secondary | ICD-10-CM | POA: Insufficient documentation

## 2019-10-30 DIAGNOSIS — M79604 Pain in right leg: Secondary | ICD-10-CM | POA: Insufficient documentation

## 2019-12-16 ENCOUNTER — Other Ambulatory Visit: Payer: Self-pay | Admitting: Family Medicine

## 2020-02-24 ENCOUNTER — Encounter: Payer: Self-pay | Admitting: Family Medicine

## 2020-02-24 ENCOUNTER — Ambulatory Visit: Payer: 59 | Admitting: Family Medicine

## 2020-02-24 ENCOUNTER — Other Ambulatory Visit: Payer: Self-pay

## 2020-02-24 VITALS — BP 122/76 | HR 81 | Temp 97.9°F | Resp 16 | Ht 66.0 in | Wt 156.2 lb

## 2020-02-24 DIAGNOSIS — E785 Hyperlipidemia, unspecified: Secondary | ICD-10-CM

## 2020-02-24 DIAGNOSIS — E663 Overweight: Secondary | ICD-10-CM | POA: Diagnosis not present

## 2020-02-24 LAB — HEPATIC FUNCTION PANEL
ALT: 14 U/L (ref 0–35)
AST: 13 U/L (ref 0–37)
Albumin: 4.2 g/dL (ref 3.5–5.2)
Alkaline Phosphatase: 57 U/L (ref 39–117)
Bilirubin, Direct: 0.3 mg/dL (ref 0.0–0.3)
Total Bilirubin: 1.4 mg/dL — ABNORMAL HIGH (ref 0.2–1.2)
Total Protein: 6.5 g/dL (ref 6.0–8.3)

## 2020-02-24 LAB — BASIC METABOLIC PANEL
BUN: 11 mg/dL (ref 6–23)
CO2: 29 mEq/L (ref 19–32)
Calcium: 9.3 mg/dL (ref 8.4–10.5)
Chloride: 107 mEq/L (ref 96–112)
Creatinine, Ser: 0.87 mg/dL (ref 0.40–1.20)
GFR: 80.71 mL/min (ref >60.00)
Glucose, Bld: 86 mg/dL (ref 70–99)
Potassium: 4.1 mEq/L (ref 3.5–5.1)
Sodium: 142 mEq/L (ref 135–145)

## 2020-02-24 LAB — LIPID PANEL
Cholesterol: 194 mg/dL (ref 0–200)
HDL: 95 mg/dL (ref >39.00)
LDL Cholesterol: 90 mg/dL (ref 0–99)
NonHDL: 99.2
Total CHOL/HDL Ratio: 2
Triglycerides: 45 mg/dL (ref 0.0–149.0)
VLDL: 9 mg/dL (ref 0.0–40.0)

## 2020-02-24 NOTE — Patient Instructions (Addendum)
Schedule your complete physical in 6 months We'll notify you of your lab results and make any changes if needed Continue to work on healthy diet and regular exercise- you're doing great!!! Call with any questions or concerns Have a great summer!!! 

## 2020-02-24 NOTE — Assessment & Plan Note (Signed)
Pt's BMI is 25.22  Applauded her efforts at regular exercise.  Encouraged healthy diet.  Will follow.

## 2020-02-24 NOTE — Assessment & Plan Note (Signed)
Chronic problem.  Tolerating statin w/o difficulty.  Check labs.  Adjust meds prn  

## 2020-02-24 NOTE — Progress Notes (Signed)
   Subjective:    Patient ID: Courtney Calhoun, female    DOB: [birth year], 59 y.o.   MRN: [id number]  HPI Hyperlipidemia- chronic problem, on Lipitor 10mg  daily.  No CP, SOB, HAs, abd pain, N/V.  Overweight- ongoing issue for pt.  BMI is 25.22.  Doing Zumba up to 5 days/week.  'I'm just eating whatever'   Review of Systems For ROS see HPI   This visit occurred during the SARS-CoV-2 public health emergency.  Safety protocols were in place, including screening questions prior to the visit, additional usage of staff PPE, and extensive cleaning of exam room while observing appropriate contact time as indicated for disinfecting solutions.       Objective:   Physical Exam Vitals reviewed.  Constitutional:      General: She is not in acute distress.    Appearance: Normal appearance. She is well-developed.  HENT:     Head: Normocephalic and atraumatic.  Eyes:     Conjunctiva/sclera: Conjunctivae normal.     Pupils: Pupils are equal, round, and reactive to light.  Neck:     Thyroid: No thyromegaly.  Cardiovascular:     Rate and Rhythm: Normal rate and regular rhythm.     Heart sounds: Normal heart sounds. No murmur.  Pulmonary:     Effort: Pulmonary effort is normal. No respiratory distress.     Breath sounds: Normal breath sounds.  Abdominal:     General: There is no distension.     Palpations: Abdomen is soft.     Tenderness: There is no abdominal tenderness.  Musculoskeletal:     Cervical back: Normal range of motion and neck supple.  Lymphadenopathy:     Cervical: No cervical adenopathy.  Skin:    General: Skin is warm and dry.  Neurological:     Mental Status: She is alert and oriented to person, place, and time.  Psychiatric:        Behavior: Behavior normal.           Assessment & Plan:

## 2020-04-21 ENCOUNTER — Encounter: Payer: Self-pay | Admitting: Family Medicine

## 2020-04-21 MED ORDER — LORATADINE 10 MG PO TABS: 10.0000 mg | ORAL_TABLET | Freq: Every day | ORAL | 1 refills | Status: DC

## 2020-04-21 MED ORDER — TRAZODONE HCL 50 MG PO TABS: 25.0000 mg | ORAL_TABLET | Freq: Every evening | ORAL | 0 refills | Status: DC | PRN

## 2020-06-27 ENCOUNTER — Other Ambulatory Visit: Payer: Self-pay | Admitting: Family Medicine

## 2020-07-04 ENCOUNTER — Telehealth (INDEPENDENT_AMBULATORY_CARE_PROVIDER_SITE_OTHER): Payer: 59 | Admitting: Family Medicine

## 2020-07-04 DIAGNOSIS — J309 Allergic rhinitis, unspecified: Secondary | ICD-10-CM

## 2020-07-04 DIAGNOSIS — H6981 Other specified disorders of Eustachian tube, right ear: Secondary | ICD-10-CM

## 2020-07-04 DIAGNOSIS — J329 Chronic sinusitis, unspecified: Secondary | ICD-10-CM | POA: Insufficient documentation

## 2020-07-04 MED ORDER — DOXYCYCLINE HYCLATE 100 MG PO TABS: 100.0000 mg | ORAL_TABLET | Freq: Two times a day (BID) | ORAL | 0 refills | Status: DC

## 2020-07-04 NOTE — Assessment & Plan Note (Signed)
She is describing a fullness and some popping intermittently in her right ear over the last several weeks.  She is asked to continue Flonase but also to perform a Valsalva maneuver after using the Flonase.

## 2020-07-04 NOTE — Progress Notes (Signed)
Virtual Visit via Video Note  I connected with Courtney Calhoun on 07/04/20 at 10:20 AM EDT by a video enabled telemedicine application and verified that I am speaking with the correct person using two identifiers.  Location: Patient: home, patient and provider in visit Provider: office   I discussed the limitations of evaluation and management by telemedicine and the availability of in person appointments. The patient expressed understanding and agreed to proceed. Nani Skillern, CMA was able to get the patient set up on a video visit    Subjective:    Patient ID: Courtney Calhoun, female    DOB: 1960/12/03, 59 y.o.   MRN: [id number]  Chief Complaint  Patient presents with  . sinus infection/pressure right ear    off/on a few weeks pt. states    HPI Patient is in today for evaluation of worsening respiratory symptoms.  She has been feeling poorly off and on for several weeks now.  She reports a long history of typically getting ill this time of year with respiratory symptoms.  Several years ago she describes an ear infection that was so bad her eardrum ruptured on the right and she has had trouble off and on ever since.  She is describing right facial pressure and sinus pressure.  She also notes right ear pain popping and fullness as well as malaise, postnasal drip and a sore throat.  She denies fevers, chills, myalgias.  She has been taking her Claritin and Flonase throughout Denies CP/palp/SOB/HA/fevers/GI or GU c/o. Taking meds as prescribed  Past Medical History:  Diagnosis Date  . Allergy   . Anemia   . Anxiety   . Depression   . Gilbert's syndrome   . Glaucoma   . Hematuria   . Hyperlipidemia   . Osteopenia     Past Surgical History:  Procedure Laterality Date  . CHOLECYSTECTOMY    . EYE SURGERY    . INNER EAR SURGERY    . WISDOM TOOTH EXTRACTION      Family History  Problem Relation Age of Onset  . Hypertension Mother   . Cancer Maternal Aunt        colon  . Stroke  Maternal Grandmother   . Hyperlipidemia Paternal Grandfather   . Colon cancer Cousin   . Colon polyps Neg Hx   . Esophageal cancer Neg Hx   . Rectal cancer Neg Hx   . Stomach cancer Neg Hx     Social History   Socioeconomic History  . Marital status: Single    Spouse name: Not on file  . Number of children: 0  . Years of education: Not on file  . Highest education level: Not on file  Occupational History  . Occupation: Product/process development scientist for [employer]  Tobacco Use  . Smoking status: Never Smoker  . Smokeless tobacco: Never Used  Vaping Use  . Vaping Use: Never used  Substance and Sexual Activity  . Alcohol use: No  . Drug use: No  . Sexual activity: Not on file  Other Topics Concern  . Not on file  Social History Narrative  . Not on file   Social Determinants of Health   Financial Resource Strain:   . Difficulty of Paying Living Expenses: Not on file  Food Insecurity:   . Worried About Charity fundraiser in the Last Year: Not on file  . Ran Out of Food in the Last Year: Not on file  Transportation Needs:   . Lack of Transportation (Medical):  Not on file  . Lack of Transportation (Non-Medical): Not on file  Physical Activity:   . Days of Exercise per Week: Not on file  . Minutes of Exercise per Session: Not on file  Stress:   . Feeling of Stress : Not on file  Social Connections:   . Frequency of Communication with Friends and Family: Not on file  . Frequency of Social Gatherings with Friends and Family: Not on file  . Attends Religious Services: Not on file  . Active Member of Clubs or Organizations: Not on file  . Attends Archivist Meetings: Not on file  . Marital Status: Not on file  Intimate Partner Violence:   . Fear of Current or Ex-Partner: Not on file  . Emotionally Abused: Not on file  . Physically Abused: Not on file  . Sexually Abused: Not on file    Outpatient Medications Prior to Visit  Medication Sig Dispense Refill  . atorvastatin (LIPITOR)  10 MG tablet TAKE 1 TABLET BY MOUTH  DAILY 90 tablet 3  . calcium carbonate (OSCAL) 1500 (600 Ca) MG TABS tablet Take 1,500 mg by mouth 2 (two) times daily with a meal.    . Cholecalciferol (VITAMIN D-1000 MAX ST) 25 MCG (1000 UT) tablet Take by mouth.    . fluticasone (FLONASE) 50 MCG/ACT nasal spray Place 2 sprays into both nostrils daily. 16 g 6  . latanoprost (XALATAN) 0.005 % ophthalmic solution Place 1 drop into both eyes at bedtime.    [name] loratadine (CLARITIN) 10 MG tablet Take 1 tablet (10 mg total) by mouth daily. 90 tablet 1  . Multiple Vitamins-Minerals (AIRBORNE) CHEW Chew 1 tablet by mouth daily.    Vladimir Faster Glycol-Propyl Glycol (SYSTANE OP) Apply to eye.    . traZODone (DESYREL) 50 MG tablet TAKE 1/2 TO 1 TABLET BY  MOUTH AT BEDTIME AS NEEDED  FOR SLEEP 90 tablet 3  . omeprazole (PRILOSEC) 20 MG capsule TAKE 1 CAPSULE BY MOUTH EVERY DAY 30 capsule 3  . levocetirizine (XYZAL) 5 MG tablet TAKE 1 TABLET BY MOUTH EVERY DAY IN THE EVENING (Patient not taking: Reported on 08/26/2019) 90 tablet 1   No facility-administered medications prior to visit.    Allergies  Allergen Reactions  . Aspirin     REACTION: GI Intolerance  . Ibuprofen     REACTION: GI Intolerance    Review of Systems  Constitutional: Negative for chills, fever and malaise/fatigue.  HENT: Positive for congestion, ear pain, sinus pain and sore throat.   Eyes: Negative for blurred vision.  Respiratory: Positive for cough. Negative for shortness of breath.   Cardiovascular: Negative for chest pain, palpitations and leg swelling.  Gastrointestinal: Negative for abdominal pain, blood in stool and nausea.  Genitourinary: Negative for dysuria and frequency.  Musculoskeletal: Negative for falls.  Skin: Negative for rash.  Neurological: Negative for dizziness, loss of consciousness and headaches.  Endo/Heme/Allergies: Negative for environmental allergies.  Psychiatric/Behavioral: Negative for depression. The patient  is not nervous/anxious.        Objective:    Physical Exam Constitutional:      Appearance: Normal appearance. She is not ill-appearing.  HENT:     Head: Normocephalic and atraumatic.     Right Ear: External ear normal.     Left Ear: External ear normal.     Nose: Nose normal.  Eyes:     General:        Right eye: No discharge.  Left eye: No discharge.  Pulmonary:     Effort: Pulmonary effort is normal.  Neurological:     Mental Status: She is alert and oriented to person, place, and time.  Psychiatric:        Behavior: Behavior normal.     Temp (!) 97.5 F (36.4 C) (Oral)   Ht 5\' 6"  (1.676 m)   Wt 156 lb (70.8 kg)   BMI 25.18 kg/m  Wt Readings from Last 3 Encounters:  07/04/20 156 lb (70.8 kg)  02/24/20 156 lb 4 oz (70.9 kg)  08/26/19 157 lb 4 oz (71.3 kg)    Diabetic Foot Exam - Simple   No data filed     Lab Results  Component Value Date   WBC 4.9 08/22/2019   HGB 13.5 08/22/2019   HCT 38.5 08/22/2019   PLT 168 08/22/2019   GLUCOSE 86 02/24/2020   CHOL 194 02/24/2020   TRIG 45.0 02/24/2020   HDL 95.00 02/24/2020   LDLDIRECT 147.2 08/26/2013   LDLCALC 90 02/24/2020   ALT 14 02/24/2020   AST 13 02/24/2020   NA 142 02/24/2020   K 4.1 02/24/2020   CL 107 02/24/2020   CREATININE 0.87 02/24/2020   BUN 11 02/24/2020   CO2 29 02/24/2020   TSH 1.22 08/26/2019   HGBA1C 5.3 01/11/2018    Lab Results  Component Value Date   TSH 1.22 08/26/2019   Lab Results  Component Value Date   WBC 4.9 08/22/2019   HGB 13.5 08/22/2019   HCT 38.5 08/22/2019   MCV 79.5 (L) 08/22/2019   PLT 168 08/22/2019   Lab Results  Component Value Date   NA 142 02/24/2020   K 4.1 02/24/2020   CO2 29 02/24/2020   GLUCOSE 86 02/24/2020   BUN 11 02/24/2020   CREATININE 0.87 02/24/2020   BILITOT 1.4 (H) 02/24/2020   ALKPHOS 57 02/24/2020   AST 13 02/24/2020   ALT 14 02/24/2020   PROT 6.5 02/24/2020   ALBUMIN 4.2 02/24/2020   CALCIUM 9.3 02/24/2020   ANIONGAP  8 08/22/2019   GFR 80.71 02/24/2020   Lab Results  Component Value Date   CHOL 194 02/24/2020   Lab Results  Component Value Date   HDL 95.00 02/24/2020   Lab Results  Component Value Date   LDLCALC 90 02/24/2020   Lab Results  Component Value Date   TRIG 45.0 02/24/2020   Lab Results  Component Value Date   CHOLHDL 2 02/24/2020   Lab Results  Component Value Date   HGBA1C 5.3 01/11/2018       Assessment & Plan:   Problem List Items Addressed This Visit    Allergic rhinitis    She is taking Claritin and Flonase daily.  She is asked to increase her Claritin to twice daily for a week and to do a Valsalva maneuver after her Flonase dosing.      Sinusitis    She notes pressure behind her right nasal congestion and right ear pain which she has had in the past and to the point of eardrum rupture.  We will start her on doxycycline 100 mg twice daily and she is asked to take Mucinex twice daily with this.  He also was asked to pick up a multistrain probiotic to take while she takes the antibiotics this month.  She will increase her rest and hydration and report if symptoms do not improve.      Relevant Medications   doxycycline (VIBRA-TABS) 100 MG tablet  ETD (Eustachian tube dysfunction), right    She is describing a fullness and some popping intermittently in her right ear over the last several weeks.  She is asked to continue Flonase but also to perform a Valsalva maneuver after using the Flonase.         I have discontinued Rhae Hammock. Capaldi's levocetirizine. I am also having her start on doxycycline. Additionally, I am having her maintain her latanoprost, Polyethyl Glycol-Propyl Glycol (SYSTANE OP), Cholecalciferol, fluticasone, calcium carbonate, Airborne, atorvastatin, omeprazole, loratadine, and traZODone.  Meds ordered this encounter  Medications  . doxycycline (VIBRA-TABS) 100 MG tablet    Sig: Take 1 tablet (100 mg total) by mouth 2 (two) times daily.     Dispense:  20 tablet    Refill:  0    I discussed the assessment and treatment plan with the patient. The patient was provided an opportunity to ask questions and all were answered. The patient agreed with the plan and demonstrated an understanding of the instructions.   The patient was advised to call back or seek an in-person evaluation if the symptoms worsen or if the condition fails to improve as anticipated.  I provided 15 minutes of non-face-to-face time during this encounter.   Penni Homans, MD

## 2020-07-04 NOTE — Assessment & Plan Note (Signed)
She is taking Claritin and Flonase daily.  She is asked to increase her Claritin to twice daily for a week and to do a Valsalva maneuver after her Flonase dosing.

## 2020-07-04 NOTE — Assessment & Plan Note (Signed)
She notes pressure behind her right nasal congestion and right ear pain which she has had in the past and to the point of eardrum rupture.  We will start her on doxycycline 100 mg twice daily and she is asked to take Mucinex twice daily with this.  He also was asked to pick up a multistrain probiotic to take while she takes the antibiotics this month.  She will increase her rest and hydration and report if symptoms do not improve.

## 2020-07-05 ENCOUNTER — Encounter: Payer: Self-pay | Admitting: Family Medicine

## 2020-07-07 ENCOUNTER — Encounter: Payer: Self-pay | Admitting: Family Medicine

## 2020-07-08 ENCOUNTER — Other Ambulatory Visit: Payer: Self-pay | Admitting: Family Medicine

## 2020-08-26 ENCOUNTER — Encounter: Payer: Self-pay | Admitting: Family Medicine

## 2020-08-26 ENCOUNTER — Ambulatory Visit (INDEPENDENT_AMBULATORY_CARE_PROVIDER_SITE_OTHER): Payer: 59 | Admitting: Family Medicine

## 2020-08-26 ENCOUNTER — Other Ambulatory Visit: Payer: Self-pay

## 2020-08-26 VITALS — BP 110/70 | HR 68 | Temp 97.3°F | Resp 17 | Ht 66.5 in | Wt 160.8 lb

## 2020-08-26 DIAGNOSIS — E785 Hyperlipidemia, unspecified: Secondary | ICD-10-CM | POA: Diagnosis not present

## 2020-08-26 DIAGNOSIS — M8580 Other specified disorders of bone density and structure, unspecified site: Secondary | ICD-10-CM | POA: Diagnosis not present

## 2020-08-26 DIAGNOSIS — Z0000 Encounter for general adult medical examination without abnormal findings: Secondary | ICD-10-CM

## 2020-08-26 LAB — CBC WITH DIFFERENTIAL/PLATELET
Basophils Absolute: 0 10*3/uL (ref 0.0–0.1)
Basophils Relative: 0.7 % (ref 0.0–3.0)
Eosinophils Absolute: 0.1 10*3/uL (ref 0.0–0.7)
Eosinophils Relative: 1.8 % (ref 0.0–5.0)
HCT: 40.6 % (ref 36.0–46.0)
Hemoglobin: 13.6 g/dL (ref 12.0–15.0)
Lymphocytes Relative: 37.1 % (ref 12.0–46.0)
Lymphs Abs: 1.2 10*3/uL (ref 0.7–4.0)
MCHC: 33.4 g/dL (ref 30.0–36.0)
MCV: 84.2 fl (ref 78.0–100.0)
Monocytes Absolute: 0.3 10*3/uL (ref 0.1–1.0)
Monocytes Relative: 8.5 % (ref 3.0–12.0)
Neutro Abs: 1.7 10*3/uL (ref 1.4–7.7)
Neutrophils Relative %: 51.9 % (ref 43.0–77.0)
Platelets: 153 10*3/uL (ref 150.0–400.0)
RBC: 4.82 Mil/uL (ref 3.87–5.11)
RDW: 15.6 % — ABNORMAL HIGH (ref 11.5–15.5)
WBC: 3.2 10*3/uL — ABNORMAL LOW (ref 4.0–10.5)

## 2020-08-26 LAB — HEPATIC FUNCTION PANEL
ALT: 8 U/L (ref 0–35)
AST: 12 U/L (ref 0–37)
Albumin: 4.1 g/dL (ref 3.5–5.2)
Alkaline Phosphatase: 54 U/L (ref 39–117)
Bilirubin, Direct: 0.3 mg/dL (ref 0.0–0.3)
Total Bilirubin: 1.5 mg/dL — ABNORMAL HIGH (ref 0.2–1.2)
Total Protein: 6.6 g/dL (ref 6.0–8.3)

## 2020-08-26 LAB — LIPID PANEL
Cholesterol: 178 mg/dL (ref 0–200)
HDL: 97.4 mg/dL (ref >39.00)
LDL Cholesterol: 73 mg/dL (ref 0–99)
NonHDL: 80.21
Total CHOL/HDL Ratio: 2
Triglycerides: 37 mg/dL (ref 0.0–149.0)
VLDL: 7.4 mg/dL (ref 0.0–40.0)

## 2020-08-26 LAB — TSH: TSH: 1.08 u[IU]/mL (ref 0.35–4.50)

## 2020-08-26 LAB — BASIC METABOLIC PANEL
BUN: 12 mg/dL (ref 6–23)
CO2: 29 mEq/L (ref 19–32)
Calcium: 9.2 mg/dL (ref 8.4–10.5)
Chloride: 107 mEq/L (ref 96–112)
Creatinine, Ser: 0.97 mg/dL (ref 0.40–1.20)
GFR: 64.08 mL/min (ref >60.00)
Glucose, Bld: 83 mg/dL (ref 70–99)
Potassium: 4 mEq/L (ref 3.5–5.1)
Sodium: 143 mEq/L (ref 135–145)

## 2020-08-26 LAB — VITAMIN D 25 HYDROXY (VIT D DEFICIENCY, FRACTURES): VITD: 41.24 ng/mL (ref 30.00–100.00)

## 2020-08-26 NOTE — Progress Notes (Signed)
° °  Subjective:    Patient ID: Courtney Calhoun, female    DOB: 15-Jul-1961, 59 y.o.   MRN: [id number]  HPI CPE- UTD on pap, mammo, colonoscopy, Tdap, flu, COVID  Reviewed past medical, surgical, family and social histories.   Patient Care Team    Relationship Specialty Notifications Start End  Midge Minium, MD PCP - General Family Medicine  05/23/11   Leticia Clas, DO Referring Physician Osteopathic Medicine  09/29/15   Delila Pereyra, MD Consulting Physician Gynecology  09/30/15     Health Maintenance  Topic Date Due   Hepatitis C Screening  02/23/2021 (Originally 04/22/1961)   HIV Screening  02/23/2021 (Originally 05/28/1976)   PAP SMEAR-Modifier  03/14/2021   MAMMOGRAM  07/07/2021   TETANUS/TDAP  05/03/2022   COLONOSCOPY  02/10/2026   INFLUENZA VACCINE  Completed   COVID-19 Vaccine  Completed      Review of Systems Patient reports no vision/ hearing changes, adenopathy,fever, weight change,  persistant/recurrent hoarseness , swallowing issues, chest pain, palpitations, edema, persistant/recurrent cough, hemoptysis, dyspnea (rest/exertional/paroxysmal nocturnal), gastrointestinal bleeding (melena, rectal bleeding), abdominal pain, significant heartburn, bowel changes, GU symptoms (dysuria, hematuria, incontinence), Gyn symptoms (abnormal  bleeding, pain),  syncope, focal weakness, memory loss, numbness & tingling, skin/hair/nail changes, abnormal bruising or bleeding, anxiety, or depression.   This visit occurred during the SARS-CoV-2 public health emergency.  Safety protocols were in place, including screening questions prior to the visit, additional usage of staff PPE, and extensive cleaning of exam room while observing appropriate contact time as indicated for disinfecting solutions.       Objective:   Physical Exam General Appearance:    Alert, cooperative, no distress, appears stated age  Head:    Normocephalic, without obvious abnormality, atraumatic  Eyes:     PERRL, conjunctiva/corneas clear, EOM's intact, fundi    benign, both eyes  Ears:    Normal TM's and external ear canals, both ears  Nose:   Deferred due to COVID  Throat:   Neck:   Supple, symmetrical, trachea midline, no adenopathy;    Thyroid: no enlargement/tenderness/nodules  Back:     Symmetric, no curvature, ROM normal, no CVA tenderness  Lungs:     Clear to auscultation bilaterally, respirations unlabored  Chest Wall:    No tenderness or deformity   Heart:    Regular rate and rhythm, S1 and S2 normal, no murmur, rub   or gallop  Breast Exam:    Deferred to GYN  Abdomen:     Soft, non-tender, bowel sounds active all four quadrants,    no masses, no organomegaly  Genitalia:    Deferred to GYN  Rectal:    Extremities:   Extremities normal, atraumatic, no cyanosis or edema  Pulses:   2+ and symmetric all extremities  Skin:   Skin color, texture, turgor normal, no rashes or lesions  Lymph nodes:   Cervical, supraclavicular, and axillary nodes normal  Neurologic:   CNII-XII intact, normal strength, sensation and reflexes    throughout          Assessment & Plan:

## 2020-08-26 NOTE — Assessment & Plan Note (Signed)
Chronic problem.  Tolerating statin w/o difficulty.  Check labs.  Adjust meds prn  

## 2020-08-26 NOTE — Assessment & Plan Note (Signed)
UTD on DEXA.  Check Vit D and replete prn. 

## 2020-08-26 NOTE — Patient Instructions (Signed)
Follow up in 6 months to recheck cholesterol We'll notify you of your lab results and make any changes if needed Continue to work on healthy diet and regular exercise- you look great! Call with any questions or concerns Stay Safe!  Stay Healthy!! Happy Thanksgiving!!!

## 2020-08-26 NOTE — Assessment & Plan Note (Signed)
Pt's PE WNL.  UTD on pap, mammo, colonoscopy, immunizations.  Check labs.  Anticipatory guidance provided.  

## 2020-11-23 ENCOUNTER — Other Ambulatory Visit: Payer: Self-pay | Admitting: Family Medicine

## 2021-02-24 ENCOUNTER — Ambulatory Visit: Payer: 59 | Admitting: Family Medicine

## 2021-02-24 ENCOUNTER — Encounter: Payer: Self-pay | Admitting: Family Medicine

## 2021-02-24 ENCOUNTER — Other Ambulatory Visit: Payer: Self-pay

## 2021-02-24 VITALS — BP 115/80 | HR 63 | Temp 97.6°F | Resp 19 | Ht 66.5 in | Wt 159.2 lb

## 2021-02-24 DIAGNOSIS — E785 Hyperlipidemia, unspecified: Secondary | ICD-10-CM

## 2021-02-24 DIAGNOSIS — E663 Overweight: Secondary | ICD-10-CM

## 2021-02-24 LAB — HEPATIC FUNCTION PANEL
ALT: 12 U/L (ref 0–35)
AST: 16 U/L (ref 0–37)
Albumin: 4.3 g/dL (ref 3.5–5.2)
Alkaline Phosphatase: 51 U/L (ref 39–117)
Bilirubin, Direct: 0.2 mg/dL (ref 0.0–0.3)
Total Bilirubin: 1.5 mg/dL — ABNORMAL HIGH (ref 0.2–1.2)
Total Protein: 6.8 g/dL (ref 6.0–8.3)

## 2021-02-24 LAB — TSH: TSH: 1.37 u[IU]/mL (ref 0.35–4.50)

## 2021-02-24 LAB — CBC WITH DIFFERENTIAL/PLATELET
Basophils Absolute: 0 10*3/uL (ref 0.0–0.1)
Basophils Relative: 0.4 % (ref 0.0–3.0)
Eosinophils Absolute: 0.1 10*3/uL (ref 0.0–0.7)
Eosinophils Relative: 1.6 % (ref 0.0–5.0)
HCT: 40.3 % (ref 36.0–46.0)
Hemoglobin: 13.8 g/dL (ref 12.0–15.0)
Lymphocytes Relative: 43.4 % (ref 12.0–46.0)
Lymphs Abs: 1.5 10*3/uL (ref 0.7–4.0)
MCHC: 34.2 g/dL (ref 30.0–36.0)
MCV: 83.7 fl (ref 78.0–100.0)
Monocytes Absolute: 0.3 10*3/uL (ref 0.1–1.0)
Monocytes Relative: 9.5 % (ref 3.0–12.0)
Neutro Abs: 1.5 10*3/uL (ref 1.4–7.7)
Neutrophils Relative %: 45.1 % (ref 43.0–77.0)
Platelets: 157 10*3/uL (ref 150.0–400.0)
RBC: 4.81 Mil/uL (ref 3.87–5.11)
RDW: 15.6 % — ABNORMAL HIGH (ref 11.5–15.5)
WBC: 3.4 10*3/uL — ABNORMAL LOW (ref 4.0–10.5)

## 2021-02-24 LAB — LIPID PANEL
Cholesterol: 182 mg/dL (ref 0–200)
HDL: 97.8 mg/dL (ref >39.00)
LDL Cholesterol: 77 mg/dL (ref 0–99)
NonHDL: 84.02
Total CHOL/HDL Ratio: 2
Triglycerides: 36 mg/dL (ref 0.0–149.0)
VLDL: 7.2 mg/dL (ref 0.0–40.0)

## 2021-02-24 LAB — BASIC METABOLIC PANEL
BUN: 16 mg/dL (ref 6–23)
CO2: 26 mEq/L (ref 19–32)
Calcium: 9.1 mg/dL (ref 8.4–10.5)
Chloride: 107 mEq/L (ref 96–112)
Creatinine, Ser: 0.89 mg/dL (ref 0.40–1.20)
GFR: 70.8 mL/min (ref >60.00)
Glucose, Bld: 72 mg/dL (ref 70–99)
Potassium: 3.9 mEq/L (ref 3.5–5.1)
Sodium: 142 mEq/L (ref 135–145)

## 2021-02-24 NOTE — Progress Notes (Signed)
   Subjective:    Patient ID: Courtney Calhoun, female    DOB: [birth year], 60 y.o.   MRN: [id number]  HPI Hyperlipidemia-  Chronic problem, on Lipitor 10mg  daily.  'I feel ok'.  No CP, SOB, HAs, abd pain, N/V.  Overweight- BMI is 25.31.  She is down 2 lbs since last visit.  Pt does the elliptical multiple times a week and does Cardio videos daily.   Review of Systems For ROS see HPI   This visit occurred during the SARS-CoV-2 public health emergency.  Safety protocols were in place, including screening questions prior to the visit, additional usage of staff PPE, and extensive cleaning of exam room while observing appropriate contact time as indicated for disinfecting solutions.       Objective:   Physical Exam Vitals reviewed.  Constitutional:      General: She is not in acute distress.    Appearance: Normal appearance. She is well-developed. She is not ill-appearing.  HENT:     Head: Normocephalic and atraumatic.  Eyes:     Conjunctiva/sclera: Conjunctivae normal.     Pupils: Pupils are equal, round, and reactive to light.  Neck:     Thyroid: No thyromegaly.  Cardiovascular:     Rate and Rhythm: Normal rate and regular rhythm.     Pulses: Normal pulses.     Heart sounds: Normal heart sounds. No murmur heard.   Pulmonary:     Effort: Pulmonary effort is normal. No respiratory distress.     Breath sounds: Normal breath sounds.  Abdominal:     General: There is no distension.     Palpations: Abdomen is soft.     Tenderness: There is no abdominal tenderness.  Musculoskeletal:     Cervical back: Normal range of motion and neck supple.     Right lower leg: No edema.     Left lower leg: No edema.  Lymphadenopathy:     Cervical: No cervical adenopathy.  Skin:    General: Skin is warm and dry.  Neurological:     General: No focal deficit present.     Mental Status: She is alert and oriented to person, place, and time.  Psychiatric:        Mood and Affect: Mood normal.         Behavior: Behavior normal.        Thought Content: Thought content normal.           Assessment & Plan:

## 2021-02-24 NOTE — Patient Instructions (Signed)
Schedule your complete physical in 6 months We'll notify you of your lab results and make any changes if needed Continue to work on healthy diet and regular exercise- you can do it! Call with any questions or concerns Stay Safe!  Stay Healthy! Have a great summer!!! 

## 2021-02-24 NOTE — Assessment & Plan Note (Signed)
Chronic problem, on Lipitor 10mg  nightly w/o difficulty.  Check labs.  Adjust meds prn

## 2021-02-24 NOTE — Assessment & Plan Note (Signed)
BMI is 25.31 and she is down a few lbs since last visit.  She is exercising regularly but admits to eating poorly.  Will continue to follow.

## 2021-03-26 ENCOUNTER — Ambulatory Visit: Payer: 59

## 2021-03-26 NOTE — Progress Notes (Signed)
   Covid-19 Vaccination Clinic  Name:  Courtney Calhoun    MRN: [id number] DOB: 1961-08-30  03/26/2021  [name] was observed post Covid-19 immunization for 15 minutes without incident. She was provided with Vaccine Information Sheet and instruction to access the V-Safe system.   [name] was instructed to call 911 with any severe reactions post vaccine: Difficulty breathing  Swelling of face and throat  A fast heartbeat  A bad rash all over body  Dizziness and weakness

## 2021-03-29 ENCOUNTER — Other Ambulatory Visit (HOSPITAL_BASED_OUTPATIENT_CLINIC_OR_DEPARTMENT_OTHER): Payer: Self-pay

## 2021-03-29 MED ORDER — COVID-19 MRNA VAC-TRIS(PFIZER) 30 MCG/0.3ML IM SUSP
INTRAMUSCULAR | 0 refills | Status: DC
  Filled 2021-03-29: qty 0.3, 1d supply, fill #0

## 2021-03-31 ENCOUNTER — Encounter: Payer: Self-pay | Admitting: *Deleted

## 2021-06-10 ENCOUNTER — Other Ambulatory Visit: Payer: Self-pay | Admitting: Family Medicine

## 2021-06-14 ENCOUNTER — Other Ambulatory Visit: Payer: Self-pay | Admitting: Family Medicine

## 2021-06-18 ENCOUNTER — Other Ambulatory Visit: Payer: Self-pay | Admitting: Family Medicine

## 2021-07-05 ENCOUNTER — Encounter: Payer: Self-pay | Admitting: Family Medicine

## 2021-07-15 ENCOUNTER — Other Ambulatory Visit (HOSPITAL_COMMUNITY): Payer: Self-pay

## 2021-07-15 MED ORDER — INFLUENZA VAC SPLIT QUAD 0.5 ML IM SUSY
PREFILLED_SYRINGE | INTRAMUSCULAR | 0 refills | Status: DC
  Filled 2021-07-15: qty 0.5, 1d supply, fill #0

## 2021-07-16 LAB — HM MAMMOGRAPHY

## 2021-07-20 ENCOUNTER — Telehealth (INDEPENDENT_AMBULATORY_CARE_PROVIDER_SITE_OTHER): Payer: 59 | Admitting: Registered Nurse

## 2021-07-20 ENCOUNTER — Encounter: Payer: Self-pay | Admitting: Registered Nurse

## 2021-07-20 ENCOUNTER — Other Ambulatory Visit: Payer: Self-pay

## 2021-07-20 VITALS — Temp 97.6°F | Ht 66.5 in | Wt 158.0 lb

## 2021-07-20 DIAGNOSIS — R0981 Nasal congestion: Secondary | ICD-10-CM

## 2021-07-20 MED ORDER — PREDNISONE 10 MG (21) PO TBPK: ORAL_TABLET | ORAL | 0 refills | Status: DC

## 2021-07-20 MED ORDER — AZELASTINE HCL 0.1 % NA SOLN: 1.0000 | Freq: Two times a day (BID) | NASAL | 12 refills | Status: DC

## 2021-07-20 NOTE — Patient Instructions (Signed)
,      If you have lab work done today you will be contacted with your lab results within the next 2 weeks.  If you have not heard from us then please contact us. The fastest way to get your results is to register for My Chart.   IF you received an x-ray today, you will receive an invoice from [facility]. Please contact [facility] at [phone] with questions or concerns regarding your invoice.   IF you received labwork today, you will receive an invoice from [facility]. Please contact [facility] at [phone] with questions or concerns regarding your invoice.   Our billing staff will not be able to assist you with questions regarding bills from these companies.  You will be contacted with the lab results as soon as they are available. The fastest way to get your results is to activate your My Chart account. Instructions are located on the last page of this paperwork. If you have not heard from us regarding the results in 2 weeks, please contact this office.     

## 2021-07-20 NOTE — Progress Notes (Signed)
Telemedicine Encounter- SOAP NOTE Established Patient  This telephone encounter was conducted with the patient's (or proxy's) verbal consent via audio telecommunications: yes/no: Yes Patient was instructed to have this encounter in a suitably private space; and to only have persons present to whom they give permission to participate. In addition, patient identity was confirmed by use of name plus two identifiers (DOB and address).  I discussed the limitations, risks, security and privacy concerns of performing an evaluation and management service by telephone and the availability of in person appointments. I also discussed with the patient that there may be a patient responsible charge related to this service. The patient expressed understanding and agreed to proceed.  I spent a total of 16 minutes talking with the patient or their proxy.  Patient at home Provider in office  Participants: Kathrin Ruddy, NP and Hollice Espy  Chief Complaint  Patient presents with   Follow-up    Patient states she has had covid a few weeks ago. Patient states she is still congested.    Subjective   Courtney Calhoun is a 60 y.o. established patient. Telephone visit today for congestion  HPI COVID - tested positive on 07/04/21  Scratchy throat for a few days before that. Had some aches. Mild congestion.   Took OTC supportive care. Advil cold and sinus Did well - got better for a while.  Now having ongoing sinus congestion and pressure. Some drainage Still some throat discomfort.   Hx of allergies - takes claritin OTC daily.  Patient Active Problem List   Diagnosis Date Noted   Sinusitis 07/04/2020   ETD (Eustachian tube dysfunction), right 07/04/2020   Overweight (BMI 25.0-29.9) 02/24/2020   Pain in joint of right hip 10/30/2019   Lower limb pain, inferior, right 10/30/2019   Insomnia 08/26/2019   GERD (gastroesophageal reflux disease) 08/26/2019   Palpitations 08/26/2019   Unilateral  osteoarthritis of first carpometacarpal ([place]) joint 10/15/2015   General medical examination 07/13/2011   Hyperlipidemia 11/08/2010   Allergic rhinitis 11/08/2010   Osteopenia 11/08/2010    Past Medical History:  Diagnosis Date   Allergy    Anemia    Anxiety    Depression    Gilbert's syndrome    Glaucoma    Hematuria    Hyperlipidemia    Osteopenia     Current Outpatient Medications  Medication Sig Dispense Refill   ALLERGY RELIEF 10 MG tablet TAKE 1 TABLET BY MOUTH  DAILY 90 tablet 1   atorvastatin (LIPITOR) 10 MG tablet TAKE 1 TABLET BY MOUTH  DAILY 90 tablet 1   Cholecalciferol 25 MCG (1000 UT) tablet Take by mouth.     COVID-19 mRNA Vac-TriS, Pfizer, SUSP injection Inject into the muscle. 0.3 mL 0   fluticasone (FLONASE) 50 MCG/ACT nasal spray Place 2 sprays into both nostrils daily. 16 g 6   influenza vac split quadrivalent PF (FLUARIX) 0.5 ML injection Inject into the muscle. 0.5 mL 0   latanoprost (XALATAN) 0.005 % ophthalmic solution Place 1 drop into both eyes at bedtime.     Multiple Vitamins-Minerals (AIRBORNE) CHEW Chew 1 tablet by mouth daily.     Polyethyl Glycol-Propyl Glycol (SYSTANE OP) Apply to eye.     traZODone (DESYREL) 50 MG tablet TAKE 1/2 TO 1 TABLET BY  MOUTH AT BEDTIME AS NEEDED  FOR SLEEP 90 tablet 0   omeprazole (PRILOSEC) 20 MG capsule TAKE 1 CAPSULE BY MOUTH EVERY DAY (Patient not taking: No sig reported) 30 capsule 3  No current facility-administered medications for this visit.    Allergies  Allergen Reactions   Aspirin     REACTION: GI Intolerance   Ibuprofen     REACTION: GI Intolerance    Social History   Socioeconomic History   Marital status: Single    Spouse name: Not on file   Number of children: 0   Years of education: Not on file   Highest education level: Not on file  Occupational History   Occupation: caseworker for [employer]  Tobacco Use   Smoking status: Never   Smokeless tobacco: Never  Vaping Use   Vaping Use: Never  used  Substance and Sexual Activity   Alcohol use: No   Drug use: No   Sexual activity: Not on file  Other Topics Concern   Not on file  Social History Narrative   Not on file   Social Determinants of Health   Financial Resource Strain: Not on file  Food Insecurity: Not on file  Transportation Needs: Not on file  Physical Activity: Not on file  Stress: Not on file  Social Connections: Not on file  Intimate Partner Violence: Not on file    ROS Per hpi   Objective   Vitals as reported by the patient: Today's Vitals   07/20/21 1325  Temp: 97.6 F (36.4 C)  TempSrc: Temporal  Weight: 158 lb (71.7 kg)  Height: 5' 6.5" (1.689 m)    There are no diagnoses linked to this encounter.  PLAN Given improvement of symptoms, suspect allergies over bacterial etiology. Will give azelastine and prednisone taper.  Pt agrees that avoiding abx at this time would be best - can start augmentin po bid x 10 days if symptoms persist or worsen. Continue otc allergy relief. Patient encouraged to call clinic with any questions, comments, or concerns.  I discussed the assessment and treatment plan with the patient. The patient was provided an opportunity to ask questions and all were answered. The patient agreed with the plan and demonstrated an understanding of the instructions.   The patient was advised to call back or seek an in-person evaluation if the symptoms worsen or if the condition fails to improve as anticipated.  I provided 16 minutes of non-face-to-face time during this encounter.  Maximiano Coss, NP

## 2021-08-31 ENCOUNTER — Encounter: Payer: Self-pay | Admitting: Family Medicine

## 2021-08-31 ENCOUNTER — Ambulatory Visit (INDEPENDENT_AMBULATORY_CARE_PROVIDER_SITE_OTHER): Payer: 59 | Admitting: Family Medicine

## 2021-08-31 VITALS — BP 106/70 | HR 92 | Temp 97.5°F | Resp 16 | Ht 66.5 in | Wt 160.0 lb

## 2021-08-31 DIAGNOSIS — M8580 Other specified disorders of bone density and structure, unspecified site: Secondary | ICD-10-CM

## 2021-08-31 DIAGNOSIS — E785 Hyperlipidemia, unspecified: Secondary | ICD-10-CM | POA: Diagnosis not present

## 2021-08-31 DIAGNOSIS — Z0000 Encounter for general adult medical examination without abnormal findings: Secondary | ICD-10-CM | POA: Diagnosis not present

## 2021-08-31 LAB — CBC WITH DIFFERENTIAL/PLATELET
Basophils Absolute: 0 10*3/uL (ref 0.0–0.1)
Basophils Relative: 0.4 % (ref 0.0–3.0)
Eosinophils Absolute: 0 10*3/uL (ref 0.0–0.7)
Eosinophils Relative: 0.6 % (ref 0.0–5.0)
HCT: 40.2 % (ref 36.0–46.0)
Hemoglobin: 13.5 g/dL (ref 12.0–15.0)
Lymphocytes Relative: 33 % (ref 12.0–46.0)
Lymphs Abs: 1.1 10*3/uL (ref 0.7–4.0)
MCHC: 33.5 g/dL (ref 30.0–36.0)
MCV: 83.9 fl (ref 78.0–100.0)
Monocytes Absolute: 0.3 10*3/uL (ref 0.1–1.0)
Monocytes Relative: 9.7 % (ref 3.0–12.0)
Neutro Abs: 1.9 10*3/uL (ref 1.4–7.7)
Neutrophils Relative %: 56.3 % (ref 43.0–77.0)
Platelets: 159 10*3/uL (ref 150.0–400.0)
RBC: 4.79 Mil/uL (ref 3.87–5.11)
RDW: 15.9 % — ABNORMAL HIGH (ref 11.5–15.5)
WBC: 3.4 10*3/uL — ABNORMAL LOW (ref 4.0–10.5)

## 2021-08-31 LAB — BASIC METABOLIC PANEL
BUN: 15 mg/dL (ref 6–23)
CO2: 26 mEq/L (ref 19–32)
Calcium: 9.2 mg/dL (ref 8.4–10.5)
Chloride: 107 mEq/L (ref 96–112)
Creatinine, Ser: 0.99 mg/dL (ref 0.40–1.20)
GFR: 62.08 mL/min (ref >60.00)
Glucose, Bld: 83 mg/dL (ref 70–99)
Potassium: 4.1 mEq/L (ref 3.5–5.1)
Sodium: 141 mEq/L (ref 135–145)

## 2021-08-31 LAB — HEPATIC FUNCTION PANEL
ALT: 10 U/L (ref 0–35)
AST: 12 U/L (ref 0–37)
Albumin: 4.1 g/dL (ref 3.5–5.2)
Alkaline Phosphatase: 58 U/L (ref 39–117)
Bilirubin, Direct: 0.2 mg/dL (ref 0.0–0.3)
Total Bilirubin: 1.4 mg/dL — ABNORMAL HIGH (ref 0.2–1.2)
Total Protein: 6.4 g/dL (ref 6.0–8.3)

## 2021-08-31 LAB — LIPID PANEL
Cholesterol: 190 mg/dL (ref 0–200)
HDL: 95.9 mg/dL (ref >39.00)
LDL Cholesterol: 85 mg/dL (ref 0–99)
NonHDL: 94.06
Total CHOL/HDL Ratio: 2
Triglycerides: 46 mg/dL (ref 0.0–149.0)
VLDL: 9.2 mg/dL (ref 0.0–40.0)

## 2021-08-31 LAB — VITAMIN D 25 HYDROXY (VIT D DEFICIENCY, FRACTURES): VITD: 46.07 ng/mL (ref 30.00–100.00)

## 2021-08-31 LAB — TSH: TSH: 1.31 u[IU]/mL (ref 0.35–5.50)

## 2021-08-31 NOTE — Assessment & Plan Note (Signed)
Pt's PE WNL.  UTD on GYN, mammo, Tdap, flu, colonoscopy.  Check labs.  Anticipatory guidance provided.

## 2021-08-31 NOTE — Progress Notes (Signed)
   Subjective:    Patient ID: Courtney Calhoun, female    DOB: 24-Apr-1961, 60 y.o.   MRN: [id number]  HPI CPE- UTD on colonoscopy, mammo, Tdap, pap, flu.  No concerns today  Patient Care Team    Relationship Specialty Notifications Start End  Midge Minium, MD PCP - General Family Medicine  05/23/11   Leticia Clas, DO Referring Physician Osteopathic Medicine  09/29/15   Delila Pereyra, MD Consulting Physician Gynecology  09/30/15     Health Maintenance  Topic Date Due   Zoster Vaccines- Shingrix (1 of 2) Never done   PAP SMEAR-Modifier  03/14/2021   Hepatitis C Screening  02/24/2022 (Originally 05/29/1979)   HIV Screening  02/24/2022 (Originally 05/28/1976)   TETANUS/TDAP  05/03/2022   MAMMOGRAM  07/16/2022   COLONOSCOPY (Pts 45-93yrs Insurance coverage will need to be confirmed)  02/10/2026   INFLUENZA VACCINE  Completed   COVID-19 Vaccine  Completed   Pneumococcal Vaccine 60-47 Years old  Aged Out   HPV VACCINES  Aged Out      Review of Systems Patient reports no vision/ hearing changes, adenopathy,fever, weight change,  persistant/recurrent hoarseness , swallowing issues, chest pain, palpitations, edema, persistant/recurrent cough, hemoptysis, dyspnea (rest/exertional/paroxysmal nocturnal), gastrointestinal bleeding (melena, rectal bleeding), abdominal pain, significant heartburn, bowel changes, GU symptoms (dysuria, hematuria, incontinence), Gyn symptoms (abnormal  bleeding, pain),  syncope, focal weakness, memory loss, numbness & tingling, skin/hair/nail changes, abnormal bruising or bleeding, anxiety, or depression.   This visit occurred during the SARS-CoV-2 public health emergency.  Safety protocols were in place, including screening questions prior to the visit, additional usage of staff PPE, and extensive cleaning of exam room while observing appropriate contact time as indicated for disinfecting solutions.      Objective:   Physical Exam General Appearance:    Alert,  cooperative, no distress, appears stated age  Head:    Normocephalic, without obvious abnormality, atraumatic  Eyes:    PERRL, conjunctiva/corneas clear, EOM's intact, fundi    benign, both eyes  Ears:    Normal TM's and external ear canals, both ears  Nose:   Deferred due to COVID  Throat:   Neck:   Supple, symmetrical, trachea midline, no adenopathy;    Thyroid: no enlargement/tenderness/nodules  Back:     Symmetric, no curvature, ROM normal, no CVA tenderness  Lungs:     Clear to auscultation bilaterally, respirations unlabored  Chest Wall:    No tenderness or deformity   Heart:    Regular rate and rhythm, S1 and S2 normal, no murmur, rub   or gallop  Breast Exam:    Deferred to GYN  Abdomen:     Soft, non-tender, bowel sounds active all four quadrants,    no masses, no organomegaly  Genitalia:    Deferred to GYN  Rectal:    Extremities:   Extremities normal, atraumatic, no cyanosis or edema  Pulses:   2+ and symmetric all extremities  Skin:   Skin color, texture, turgor normal, no rashes or lesions  Lymph nodes:   Cervical, supraclavicular, and axillary nodes normal  Neurologic:   CNII-XII intact, normal strength, sensation and reflexes    throughout          Assessment & Plan:

## 2021-08-31 NOTE — Assessment & Plan Note (Signed)
Chronic problem.  Tolerating statin w/o difficulty.  Check labs.  Adjust meds prn  

## 2021-08-31 NOTE — Patient Instructions (Addendum)
Follow up in 6 months to recheck cholesterol We'll notify you of your lab results and make any changes if needed Continue to work on healthy diet and regular exercise- you're doing great! Call with any questions or concerns Stay Safe!  Stay Healthy! Happy Holidays!!!

## 2021-08-31 NOTE — Assessment & Plan Note (Signed)
Check Vit D level and replete prn. 

## 2021-09-02 ENCOUNTER — Telehealth: Payer: Self-pay

## 2021-09-02 NOTE — Telephone Encounter (Signed)
-----   Message from Midge Minium, MD sent at 08/31/2021  2:22 PM EST ----- Labs look great!  No changes at this time

## 2021-09-02 NOTE — Telephone Encounter (Signed)
Patient aware of labs.  

## 2021-09-23 ENCOUNTER — Ambulatory Visit: Payer: 59 | Admitting: Sports Medicine

## 2021-09-23 ENCOUNTER — Ambulatory Visit (INDEPENDENT_AMBULATORY_CARE_PROVIDER_SITE_OTHER): Payer: 59

## 2021-09-23 ENCOUNTER — Other Ambulatory Visit: Payer: Self-pay | Admitting: Sports Medicine

## 2021-09-23 ENCOUNTER — Other Ambulatory Visit: Payer: Self-pay

## 2021-09-23 ENCOUNTER — Encounter: Payer: Self-pay | Admitting: Sports Medicine

## 2021-09-23 DIAGNOSIS — M779 Enthesopathy, unspecified: Secondary | ICD-10-CM | POA: Diagnosis not present

## 2021-09-23 DIAGNOSIS — M79671 Pain in right foot: Secondary | ICD-10-CM

## 2021-09-23 DIAGNOSIS — S93491A Sprain of other ligament of right ankle, initial encounter: Secondary | ICD-10-CM

## 2021-09-23 MED ORDER — PREDNISONE 10 MG (21) PO TBPK: ORAL_TABLET | ORAL | 0 refills | Status: DC

## 2021-09-23 NOTE — Progress Notes (Signed)
Subjective:  Courtney Calhoun is a 60 y.o. female patient who presents to office for evaluation of right foot and ankle pain. Patient complains of continued pain in the ankle and throbbing and shooting pain that radiates down to the toes states that she fell the beginning of this month trying to chase after some luggage states that it was really swollen at her ankle but she iced and elevated and the swelling went down but now it still hurts.  Patient reports that pain all over is 2-3 out of 10 however she does get some worsening pain in the medial foot that is 7 out of 10 from time to time.  Patient Active Problem List   Diagnosis Date Noted   ETD (Eustachian tube dysfunction), right 07/04/2020   Overweight (BMI 25.0-29.9) 02/24/2020   Pain in joint of right hip 10/30/2019   Lower limb pain, inferior, right 10/30/2019   Insomnia 08/26/2019   GERD (gastroesophageal reflux disease) 08/26/2019   Palpitations 08/26/2019   Unilateral osteoarthritis of first carpometacarpal ([place]) joint 10/15/2015   General medical examination 07/13/2011   Hyperlipidemia 11/08/2010   Allergic rhinitis 11/08/2010   Osteopenia 11/08/2010    Current Outpatient Medications on File Prior to Visit  Medication Sig Dispense Refill   ALLERGY RELIEF 10 MG tablet TAKE 1 TABLET BY MOUTH  DAILY 90 tablet 1   atorvastatin (LIPITOR) 10 MG tablet TAKE 1 TABLET BY MOUTH  DAILY 90 tablet 1   azelastine (ASTELIN) 0.1 % nasal spray Place 1 spray into both nostrils 2 (two) times daily. Use in each nostril as directed 30 mL 12   Cholecalciferol 25 MCG (1000 UT) tablet Take by mouth.     fluticasone (FLONASE) 50 MCG/ACT nasal spray Place 2 sprays into both nostrils daily. 16 g 6   latanoprost (XALATAN) 0.005 % ophthalmic solution Place 1 drop into both eyes at bedtime.     Multiple Vitamins-Minerals (AIRBORNE) CHEW Chew 1 tablet by mouth daily.     Probiotic Product (CULTURELLE PROBIOTICS PO) Take by mouth.     traZODone (DESYREL) 50  MG tablet TAKE 1/2 TO 1 TABLET BY  MOUTH AT BEDTIME AS NEEDED  FOR SLEEP 90 tablet 0   No current facility-administered medications on file prior to visit.    Allergies  Allergen Reactions   Aspirin     REACTION: GI Intolerance   Ibuprofen     REACTION: GI Intolerance    Objective:  General: Alert and oriented x3 in no acute distress  Dermatology: No open lesions bilateral lower extremities, no webspace macerations, no ecchymosis bilateral, all nails x 10 are well manicured.  Vascular: Dorsalis Pedis and Posterior Tibial pedal pulses palpable, Capillary Fill Time 3 seconds,(+) pedal hair growth bilateral, no edema bilateral lower extremities, Temperature gradient within normal limits.  Neurology: Johney Maine sensation intact via light touch bilateral.  Musculoskeletal: Mild tenderness with palpation at dorsal lateral right foot and ankle and worse pain from time to time at the medial first tarsometatarsal joint at the PT area of the right foot.  Strength within normal limits in all groups bilateral.  Pes planus deformity.  Gait: Mildly antalgic gait  Xrays  Right foot/ankle   Impression: No acute osseous findings no fracture or dislocation midtarsal breech supportive of pes planus deformity with unchanged midtarsal arthritis  Assessment and Plan: Problem List Items Addressed This Visit   None Visit Diagnoses     Right foot pain    -  Primary   Relevant Orders  DG Foot Complete Right   Tendonitis       Relevant Medications   predniSONE (STERAPRED UNI-PAK 21 TAB) 10 MG (21) TBPK tablet   Sprain of other ligament of right ankle, initial encounter            -Complete examination performed -Xrays reviewed -Discussed treatement options -Rx Tri-Lock to give foot and ankle some support and stability due to history of inversion injury -Rx prednisone Dosepak -Return in 1 month if no better we will order a MRI -Patient to return to office as needed or sooner if condition  worsens.  Landis Martins, DPM

## 2021-09-24 ENCOUNTER — Encounter: Payer: Self-pay | Admitting: Sports Medicine

## 2021-10-21 ENCOUNTER — Encounter: Payer: Self-pay | Admitting: Sports Medicine

## 2021-10-21 ENCOUNTER — Other Ambulatory Visit: Payer: Self-pay

## 2021-10-21 ENCOUNTER — Ambulatory Visit: Payer: 59 | Admitting: Sports Medicine

## 2021-10-21 DIAGNOSIS — M10079 Idiopathic gout, unspecified ankle and foot: Secondary | ICD-10-CM

## 2021-10-21 DIAGNOSIS — M779 Enthesopathy, unspecified: Secondary | ICD-10-CM

## 2021-10-21 DIAGNOSIS — M21619 Bunion of unspecified foot: Secondary | ICD-10-CM | POA: Diagnosis not present

## 2021-10-21 DIAGNOSIS — M79671 Pain in right foot: Secondary | ICD-10-CM

## 2021-10-21 NOTE — Progress Notes (Signed)
Subjective: Courtney Calhoun is a 61 y.o. female patient who returns to office for follow-up evaluation of right foot and ankle pain.  Patient reports that pain at the ankle seems to be better however still having achy and sharp pains at the big toe joint on the right.  Patient does admit that her mom is getting over an issue with gout and has just finished prednisone as well.  Patient denies any other pedal complaints at this time..  Patient Active Problem List   Diagnosis Date Noted   ETD (Eustachian tube dysfunction), right 07/04/2020   Overweight (BMI 25.0-29.9) 02/24/2020   Pain in joint of right hip 10/30/2019   Lower limb pain, inferior, right 10/30/2019   Insomnia 08/26/2019   GERD (gastroesophageal reflux disease) 08/26/2019   Palpitations 08/26/2019   Unilateral osteoarthritis of first carpometacarpal ([place]) joint 10/15/2015   General medical examination 07/13/2011   Hyperlipidemia 11/08/2010   Allergic rhinitis 11/08/2010   Osteopenia 11/08/2010    Current Outpatient Medications on File Prior to Visit  Medication Sig Dispense Refill   ALLERGY RELIEF 10 MG tablet TAKE 1 TABLET BY MOUTH  DAILY 90 tablet 1   atorvastatin (LIPITOR) 10 MG tablet TAKE 1 TABLET BY MOUTH  DAILY 90 tablet 1   azelastine (ASTELIN) 0.1 % nasal spray Place 1 spray into both nostrils 2 (two) times daily. Use in each nostril as directed 30 mL 12   Cholecalciferol 25 MCG (1000 UT) tablet Take by mouth.     fluticasone (FLONASE) 50 MCG/ACT nasal spray Place 2 sprays into both nostrils daily. 16 g 6   latanoprost (XALATAN) 0.005 % ophthalmic solution Place 1 drop into both eyes at bedtime.     Multiple Vitamins-Minerals (AIRBORNE) CHEW Chew 1 tablet by mouth daily.     predniSONE (STERAPRED UNI-PAK 21 TAB) 10 MG (21) TBPK tablet Take per package instructions. Do not skip doses. Finish entire supply. 1 each 0   Probiotic Product (CULTURELLE PROBIOTICS PO) Take by mouth.     traZODone (DESYREL) 50 MG tablet TAKE  1/2 TO 1 TABLET BY  MOUTH AT BEDTIME AS NEEDED  FOR SLEEP 90 tablet 0   No current facility-administered medications on file prior to visit.    Allergies  Allergen Reactions   Aspirin     REACTION: GI Intolerance   Ibuprofen     REACTION: GI Intolerance    Objective:  General: Alert and oriented x3 in no acute distress  Dermatology: No open lesions bilateral lower extremities, no webspace macerations, no ecchymosis bilateral, all nails x 10 are well manicured.  Vascular: Dorsalis Pedis and Posterior Tibial pedal pulses palpable, Capillary Fill Time 3 seconds,(+) pedal hair growth bilateral, no edema bilateral lower extremities, Temperature gradient within normal limits.  Neurology: Gross sensation intact via light touch bilateral.  Musculoskeletal: No reproducible tenderness to the dorsal lateral right foot and ankle.  There is pain with direct palpation to the first metatarsophalangeal joint on the right but no limitation or restriction with range of motion no joint crepitus noted.  Strength within normal limits in all groups bilateral.  Pes planus deformity.  Assessment and Plan: Problem List Items Addressed This Visit   None Visit Diagnoses     Idiopathic gout of foot, unspecified chronicity, unspecified laterality    -  Primary   Relevant Orders   Arthritis Panel   Right foot pain       Tendonitis       Capsulitis  Bunion            -Complete examination performed -Discussed treatment options -Rx arthritic panel for further evaluation of joint pains advised patient if this is negative we will proceed with ordering an MRI -Continue with Tri-Lock to give foot and ankle support meanwhile in good supportive shoes -Return in after blood work or sooner if problems or issues arise.  Landis Martins, DPM

## 2021-10-22 LAB — ARTHRITIS PANEL
Anti Nuclear Antibody (ANA): NEGATIVE
Rheumatoid fact SerPl-aCnc: <10 IU/mL (ref <14.0)
Sed Rate: 10 mm/hr (ref 0–40)
Uric Acid: 2.8 mg/dL — ABNORMAL LOW (ref 3.0–7.2)

## 2021-10-27 ENCOUNTER — Telehealth: Payer: Self-pay | Admitting: *Deleted

## 2021-10-27 NOTE — Telephone Encounter (Signed)
Called and spoke with the patient and relayed the message per Dr Stover. Courtney Calhoun °

## 2021-10-27 NOTE — Telephone Encounter (Signed)
-----   Message from Landis Martins, [place] sent at 10/25/2021  4:44 PM EST ----- Will you let patient know that her Uric Acid level came back low. This could be the result of excessive urination or dehydration. Patient should focus on eating a balance diet and adding protein to help or meal replacement drinks/shakes. Otherwise the remainder of her arthritic panel was negative. Have patient to try this for the next 2-3 weeks if foot pain is the same, call my office back to let me know so I can proceed with ordering MRI. Thanks Dr. Chauncey Cruel

## 2021-11-25 ENCOUNTER — Other Ambulatory Visit: Payer: Self-pay | Admitting: Family Medicine

## 2021-12-02 ENCOUNTER — Ambulatory Visit: Payer: 59 | Attending: Internal Medicine

## 2021-12-02 ENCOUNTER — Other Ambulatory Visit (HOSPITAL_BASED_OUTPATIENT_CLINIC_OR_DEPARTMENT_OTHER): Payer: Self-pay

## 2021-12-02 DIAGNOSIS — Z23 Encounter for immunization: Secondary | ICD-10-CM

## 2021-12-02 MED ORDER — PFIZER COVID-19 VAC BIVALENT 30 MCG/0.3ML IM SUSP
INTRAMUSCULAR | 0 refills | Status: DC
  Filled 2021-12-02: qty 0.3, 1d supply, fill #0

## 2021-12-02 NOTE — Progress Notes (Signed)
? ?  [facility] ? ?Name:  Courtney Calhoun    ?MRN: [id number] ?DOB: September 02, 1961 ? ?12/02/2021 ? ?[name] was observed post Covid-19 immunization for 15 minutes without incident. She was provided with Vaccine Information Sheet and instruction to access the V-Safe system.  ? ?[name] was instructed to call 911 with any severe reactions post vaccine: ?Difficulty breathing  ?Swelling of face and throat  ?A fast heartbeat  ?A bad rash all over body  ?Dizziness and weakness  ? ?Immunizations Administered   ? ? Name Date Dose VIS Date Route  ? Ambulance person Booster 12/02/2021  1:32 PM 0.3 mL 06/02/2021 Intramuscular  ? Manufacturer: [place]: [id number]  ? [place]: [phone]  ? ?  ? ? ?

## 2021-12-16 ENCOUNTER — Other Ambulatory Visit: Payer: Self-pay | Admitting: Family Medicine

## 2021-12-26 ENCOUNTER — Other Ambulatory Visit: Payer: Self-pay | Admitting: Family Medicine

## 2022-03-01 ENCOUNTER — Encounter: Payer: Self-pay | Admitting: Family Medicine

## 2022-03-01 ENCOUNTER — Ambulatory Visit: Payer: 59 | Admitting: Family Medicine

## 2022-03-01 VITALS — BP 126/82 | HR 57 | Temp 98.0°F | Resp 16 | Ht 66.5 in | Wt 150.8 lb

## 2022-03-01 DIAGNOSIS — E785 Hyperlipidemia, unspecified: Secondary | ICD-10-CM

## 2022-03-01 DIAGNOSIS — E663 Overweight: Secondary | ICD-10-CM | POA: Diagnosis not present

## 2022-03-01 LAB — BASIC METABOLIC PANEL
BUN: 12 mg/dL (ref 6–23)
CO2: 26 mEq/L (ref 19–32)
Calcium: 9.3 mg/dL (ref 8.4–10.5)
Chloride: 107 mEq/L (ref 96–112)
Creatinine, Ser: 0.98 mg/dL (ref 0.40–1.20)
GFR: 62.63 mL/min (ref >60.00)
Glucose, Bld: 84 mg/dL (ref 70–99)
Potassium: 4 mEq/L (ref 3.5–5.1)
Sodium: 141 mEq/L (ref 135–145)

## 2022-03-01 LAB — LIPID PANEL
Cholesterol: 195 mg/dL (ref 0–200)
HDL: 95.9 mg/dL (ref >39.00)
LDL Cholesterol: 88 mg/dL (ref 0–99)
NonHDL: 98.64
Total CHOL/HDL Ratio: 2
Triglycerides: 53 mg/dL (ref 0.0–149.0)
VLDL: 10.6 mg/dL (ref 0.0–40.0)

## 2022-03-01 LAB — HEPATIC FUNCTION PANEL
ALT: 11 U/L (ref 0–35)
AST: 13 U/L (ref 0–37)
Albumin: 4.2 g/dL (ref 3.5–5.2)
Alkaline Phosphatase: 51 U/L (ref 39–117)
Bilirubin, Direct: 0.3 mg/dL (ref 0.0–0.3)
Total Bilirubin: 1.9 mg/dL — ABNORMAL HIGH (ref 0.2–1.2)
Total Protein: 6.5 g/dL (ref 6.0–8.3)

## 2022-03-01 LAB — TSH: TSH: 0.95 u[IU]/mL (ref 0.35–5.50)

## 2022-03-01 NOTE — Progress Notes (Signed)
   Subjective:    Patient ID: Courtney Calhoun, female    DOB: [birth year], 61 y.o.   MRN: [id number]  HPI Hyperlipidemia- chronic problem, on Lipitor '10mg'$  daily, Fish Oil daily.  Last LDL 85.  No CP, SOB, HAs, visual changes, abd pain, N/V.  Overweight- pt is down 10 lbs since last visit.  BMI now 23.98.  She has been exercising regularly.     Review of Systems For ROS see HPI     Objective:   Physical Exam Vitals reviewed.  Constitutional:      General: She is not in acute distress.    Appearance: Normal appearance. She is well-developed. She is not ill-appearing.  HENT:     Head: Normocephalic and atraumatic.  Eyes:     Conjunctiva/sclera: Conjunctivae normal.     Pupils: Pupils are equal, round, and reactive to light.  Neck:     Thyroid: No thyromegaly.  Cardiovascular:     Rate and Rhythm: Normal rate and regular rhythm.     Pulses: Normal pulses.     Heart sounds: Normal heart sounds. No murmur heard. Pulmonary:     Effort: Pulmonary effort is normal. No respiratory distress.     Breath sounds: Normal breath sounds.  Abdominal:     General: There is no distension.     Palpations: Abdomen is soft.     Tenderness: There is no abdominal tenderness.  Musculoskeletal:     Cervical back: Normal range of motion and neck supple.     Right lower leg: No edema.     Left lower leg: No edema.  Lymphadenopathy:     Cervical: No cervical adenopathy.  Skin:    General: Skin is warm and dry.  Neurological:     Mental Status: She is alert and oriented to person, place, and time.  Psychiatric:        Behavior: Behavior normal.          Assessment & Plan:

## 2022-03-01 NOTE — Addendum Note (Signed)
Addended by: Patrcia Dolly on: 03/01/2022 03:59 PM   Modules accepted: Orders

## 2022-03-01 NOTE — Assessment & Plan Note (Signed)
Chronic problem.  Tolerating Lipitor '10mg'$  daily w/o difficulty.  She is down 10 lbs since last visit.  Applauded her efforts.  Check labs.  Adjust meds prn

## 2022-03-01 NOTE — Assessment & Plan Note (Signed)
Pt is no longer in the overweight category!  Her 10 lb weight loss puts BMI at 23.98!  Applauded her efforts and encouraged her to continue.

## 2022-03-01 NOTE — Patient Instructions (Signed)
Schedule your complete physical in 6 months We'll notify you of your lab results and make any changes if needed Keep up the good work on healthy diet and regular exercise- you look AWESOME!!! Call with any questions or concerns Stay Safe!  Stay Healthy! Have a great summer!!!

## 2022-03-07 ENCOUNTER — Other Ambulatory Visit (INDEPENDENT_AMBULATORY_CARE_PROVIDER_SITE_OTHER): Payer: 59

## 2022-03-07 ENCOUNTER — Other Ambulatory Visit: Payer: Self-pay

## 2022-03-07 DIAGNOSIS — Z0000 Encounter for general adult medical examination without abnormal findings: Secondary | ICD-10-CM

## 2022-03-07 DIAGNOSIS — E785 Hyperlipidemia, unspecified: Secondary | ICD-10-CM

## 2022-03-07 LAB — CBC WITH DIFFERENTIAL/PLATELET
Basophils Absolute: 0 10*3/uL (ref 0.0–0.1)
Basophils Relative: 0.2 % (ref 0.0–3.0)
Eosinophils Absolute: 0 10*3/uL (ref 0.0–0.7)
Eosinophils Relative: 0.8 % (ref 0.0–5.0)
HCT: 41.4 % (ref 36.0–46.0)
Hemoglobin: 14 g/dL (ref 12.0–15.0)
Lymphocytes Relative: 38.7 % (ref 12.0–46.0)
Lymphs Abs: 1.6 10*3/uL (ref 0.7–4.0)
MCHC: 33.9 g/dL (ref 30.0–36.0)
MCV: 83.2 fl (ref 78.0–100.0)
Monocytes Absolute: 0.4 10*3/uL (ref 0.1–1.0)
Monocytes Relative: 9.5 % (ref 3.0–12.0)
Neutro Abs: 2 10*3/uL (ref 1.4–7.7)
Neutrophils Relative %: 50.8 % (ref 43.0–77.0)
Platelets: 146 10*3/uL — ABNORMAL LOW (ref 150.0–400.0)
RBC: 4.97 Mil/uL (ref 3.87–5.11)
RDW: 15.5 % (ref 11.5–15.5)
WBC: 4 10*3/uL (ref 4.0–10.5)

## 2022-03-09 ENCOUNTER — Telehealth: Payer: Self-pay

## 2022-03-09 NOTE — Telephone Encounter (Signed)
-----   Message from Midge Minium, MD sent at 03/09/2022  7:48 AM EDT ----- Blood count is normal- great news!

## 2022-03-09 NOTE — Telephone Encounter (Signed)
Pt advised of lab results.  

## 2022-06-23 ENCOUNTER — Encounter: Payer: Self-pay | Admitting: Family Medicine

## 2022-06-23 DIAGNOSIS — M8580 Other specified disorders of bone density and structure, unspecified site: Secondary | ICD-10-CM

## 2022-06-24 ENCOUNTER — Other Ambulatory Visit (HOSPITAL_BASED_OUTPATIENT_CLINIC_OR_DEPARTMENT_OTHER): Payer: Self-pay

## 2022-06-24 MED ORDER — FLUARIX QUADRIVALENT 0.5 ML IM SUSY
PREFILLED_SYRINGE | INTRAMUSCULAR | 0 refills | Status: DC
  Filled 2022-06-24: qty 0.5, 1d supply, fill #0

## 2022-06-24 NOTE — Telephone Encounter (Signed)
Last OV 03/01/2022 okay to order the DEXA?

## 2022-07-08 ENCOUNTER — Other Ambulatory Visit (HOSPITAL_BASED_OUTPATIENT_CLINIC_OR_DEPARTMENT_OTHER): Payer: Self-pay

## 2022-07-14 ENCOUNTER — Other Ambulatory Visit: Payer: Self-pay | Admitting: Family Medicine

## 2022-07-21 LAB — HM MAMMOGRAPHY

## 2022-07-22 ENCOUNTER — Encounter: Payer: Self-pay | Admitting: Family Medicine

## 2022-07-25 ENCOUNTER — Encounter: Payer: Self-pay | Admitting: Family Medicine

## 2022-08-03 LAB — HM MAMMOGRAPHY

## 2022-09-01 ENCOUNTER — Encounter: Payer: Self-pay | Admitting: Family Medicine

## 2022-09-01 ENCOUNTER — Ambulatory Visit (INDEPENDENT_AMBULATORY_CARE_PROVIDER_SITE_OTHER): Payer: 59 | Admitting: Family Medicine

## 2022-09-01 VITALS — BP 122/68 | HR 62 | Temp 97.7°F | Ht 66.0 in | Wt 155.4 lb

## 2022-09-01 DIAGNOSIS — Z0000 Encounter for general adult medical examination without abnormal findings: Secondary | ICD-10-CM | POA: Diagnosis not present

## 2022-09-01 DIAGNOSIS — E785 Hyperlipidemia, unspecified: Secondary | ICD-10-CM | POA: Diagnosis not present

## 2022-09-01 LAB — HEPATIC FUNCTION PANEL
ALT: 17 U/L (ref 0–35)
AST: 18 U/L (ref 0–37)
Albumin: 4.3 g/dL (ref 3.5–5.2)
Alkaline Phosphatase: 46 U/L (ref 39–117)
Bilirubin, Direct: 0.3 mg/dL (ref 0.0–0.3)
Total Bilirubin: 1.7 mg/dL — ABNORMAL HIGH (ref 0.2–1.2)
Total Protein: 6.9 g/dL (ref 6.0–8.3)

## 2022-09-01 LAB — CBC WITH DIFFERENTIAL/PLATELET
Basophils Absolute: 0 10*3/uL (ref 0.0–0.1)
Basophils Relative: 0.1 % (ref 0.0–3.0)
Eosinophils Absolute: 0.1 10*3/uL (ref 0.0–0.7)
Eosinophils Relative: 2.1 % (ref 0.0–5.0)
HCT: 40.6 % (ref 36.0–46.0)
Hemoglobin: 13.8 g/dL (ref 12.0–15.0)
Lymphocytes Relative: 43.8 % (ref 12.0–46.0)
Lymphs Abs: 1.4 10*3/uL (ref 0.7–4.0)
MCHC: 34.1 g/dL (ref 30.0–36.0)
MCV: 84.5 fl (ref 78.0–100.0)
Monocytes Absolute: 0.3 10*3/uL (ref 0.1–1.0)
Monocytes Relative: 10.9 % (ref 3.0–12.0)
Neutro Abs: 1.4 10*3/uL (ref 1.4–7.7)
Neutrophils Relative %: 43.1 % (ref 43.0–77.0)
Platelets: 152 10*3/uL (ref 150.0–400.0)
RBC: 4.81 Mil/uL (ref 3.87–5.11)
RDW: 15.1 % (ref 11.5–15.5)
WBC: 3.1 10*3/uL — ABNORMAL LOW (ref 4.0–10.5)

## 2022-09-01 LAB — BASIC METABOLIC PANEL
BUN: 15 mg/dL (ref 6–23)
CO2: 29 mEq/L (ref 19–32)
Calcium: 9.1 mg/dL (ref 8.4–10.5)
Chloride: 107 mEq/L (ref 96–112)
Creatinine, Ser: 0.94 mg/dL (ref 0.40–1.20)
GFR: 65.61 mL/min (ref >60.00)
Glucose, Bld: 86 mg/dL (ref 70–99)
Potassium: 3.9 mEq/L (ref 3.5–5.1)
Sodium: 140 mEq/L (ref 135–145)

## 2022-09-01 LAB — TSH: TSH: 1.05 u[IU]/mL (ref 0.35–5.50)

## 2022-09-01 LAB — LIPID PANEL
Cholesterol: 197 mg/dL (ref 0–200)
HDL: 104.1 mg/dL (ref >39.00)
LDL Cholesterol: 84 mg/dL (ref 0–99)
NonHDL: 93.14
Total CHOL/HDL Ratio: 2
Triglycerides: 46 mg/dL (ref 0.0–149.0)
VLDL: 9.2 mg/dL (ref 0.0–40.0)

## 2022-09-01 NOTE — Assessment & Plan Note (Signed)
Chronic problem.  Tolerating statin w/o difficulty.  Check labs.  Adjust meds prn  

## 2022-09-01 NOTE — Progress Notes (Signed)
   Subjective:    Patient ID: Courtney Calhoun, female    DOB: 06-19-1961, 61 y.o.   MRN: [id number]  HPI CPE- UTD on mammo, colonoscopy.  UTD on flu.  Pt thinks that she had Tdap w/ work- is going to [employer].  UTD on pap- [facility]  Patient Care Team    Relationship Specialty Notifications Start End  Midge Minium, MD PCP - General Family Medicine  05/23/11   Leticia Clas, DO Referring Physician Osteopathic Medicine  09/29/15   Delila Pereyra, MD Consulting Physician Gynecology  09/30/15     Health Maintenance  Topic Date Due   PAP SMEAR-Modifier  09/01/2022 (Originally 03/14/2021)   COVID-19 Vaccine (10 - 2023-24 season) 09/17/2022 (Originally 06/03/2022)   Zoster Vaccines- Shingrix (1 of 2) 12/01/2022 (Originally 05/29/2011)   Hepatitis C Screening  03/02/2023 (Originally 05/29/1979)   HIV Screening  03/02/2023 (Originally 05/28/1976)   MAMMOGRAM  08/04/2023   COLONOSCOPY (Pts 45-25yr Insurance coverage will need to be confirmed)  02/10/2026   INFLUENZA VACCINE  Completed   HPV VACCINES  Aged Out   DTaP/Tdap/Td  Discontinued      Review of Systems Patient reports no vision/ hearing changes, adenopathy,fever, weight change,  persistant/recurrent hoarseness , swallowing issues, chest pain, palpitations, edema, persistant/recurrent cough, hemoptysis, dyspnea (rest/exertional/paroxysmal nocturnal), gastrointestinal bleeding (melena, rectal bleeding), abdominal pain, significant heartburn, bowel changes, GU symptoms (dysuria, hematuria, incontinence), Gyn symptoms (abnormal  bleeding, pain),  syncope, focal weakness, memory loss, numbness & tingling, skin/hair/nail changes, abnormal bruising or bleeding, anxiety, or depression.     Objective:   Physical Exam General Appearance:    Alert, cooperative, no distress, appears stated age  Head:    Normocephalic, without obvious abnormality, atraumatic  Eyes:    PERRL, conjunctiva/corneas clear, EOM's intact both  eyes  Ears:    Normal TM's and external ear canals, both ears  Nose:   Nares normal, septum midline, mucosa normal, no drainage    or sinus tenderness  Throat:   Lips, mucosa, and tongue normal; teeth and gums normal  Neck:   Supple, symmetrical, trachea midline, no adenopathy;    Thyroid: no enlargement/tenderness/nodules  Back:     Symmetric, no curvature, ROM normal, no CVA tenderness  Lungs:     Clear to auscultation bilaterally, respirations unlabored  Chest Wall:    No tenderness or deformity   Heart:    Regular rate and rhythm, S1 and S2 normal, no murmur, rub   or gallop  Breast Exam:    Deferred to GYN  Abdomen:     Soft, non-tender, bowel sounds active all four quadrants,    no masses, no organomegaly  Genitalia:    Deferred to GYN  Rectal:    Extremities:   Extremities normal, atraumatic, no cyanosis or edema  Pulses:   2+ and symmetric all extremities  Skin:   Skin color, texture, turgor normal, no rashes or lesions  Lymph nodes:   Cervical, supraclavicular, and axillary nodes normal  Neurologic:   CNII-XII intact, normal strength, sensation and reflexes    throughout          Assessment & Plan:

## 2022-09-01 NOTE — Patient Instructions (Signed)
Follow up in 1 year or as needed We'll notify you of your lab results and make any changes if needed Continue to work on healthy diet and regular exercise- you can do it! Check w/ Employee Health and let me know about the Tdap Call with any questions or concerns Stay Safe!  Stay Healthy! Happy Holidays!!

## 2022-09-01 NOTE — Assessment & Plan Note (Signed)
Pt's PE WNL.  UTD on pap, mammo, colonoscopy, flu.  She thinks she is UTD on Tdap through work.  Is going to check and let me know.  Check labs.  Anticipatory guidance provided.

## 2022-09-05 ENCOUNTER — Encounter: Payer: Self-pay | Admitting: Family Medicine

## 2022-10-25 ENCOUNTER — Other Ambulatory Visit: Payer: Self-pay | Admitting: Family Medicine

## 2022-12-05 ENCOUNTER — Encounter: Payer: Self-pay | Admitting: Family Medicine

## 2023-01-16 ENCOUNTER — Other Ambulatory Visit: Payer: Self-pay | Admitting: Family Medicine

## 2023-05-12 ENCOUNTER — Ambulatory Visit: Payer: 59 | Admitting: Family Medicine

## 2023-06-08 ENCOUNTER — Encounter: Payer: Self-pay | Admitting: Family Medicine

## 2023-06-08 ENCOUNTER — Ambulatory Visit: Payer: 59 | Admitting: Family Medicine

## 2023-06-08 VITALS — BP 106/78 | HR 63 | Temp 98.2°F | Resp 16 | Ht 66.0 in | Wt 160.2 lb

## 2023-06-08 DIAGNOSIS — M25512 Pain in left shoulder: Secondary | ICD-10-CM

## 2023-06-08 DIAGNOSIS — Z23 Encounter for immunization: Secondary | ICD-10-CM

## 2023-06-08 MED ORDER — MELOXICAM 7.5 MG PO TABS: 7.5000 mg | ORAL_TABLET | Freq: Every day | ORAL | 0 refills | Status: DC

## 2023-06-08 NOTE — Progress Notes (Signed)
   Subjective:    Patient ID: Courtney Calhoun, female    DOB: 07/04/1961, 62 y.o.   MRN: [id number]  HPI L shoulder pain- carries purse and groceries on L side.  Sleeps on L side.  Sxs started in May.  Having trouble w/ abduction, internal rotation.  Forward flexion is ok.  Has to sleep w/ arm propped up.  Pt had a session w/ personal trainer yesterday and was given stretches to do and pain improved.  Will use topical pain relievers w/ minimal improvement.     Review of Systems For ROS see HPI     Objective:   Physical Exam Vitals reviewed.  Constitutional:      General: She is not in acute distress.    Appearance: Normal appearance. She is not ill-appearing.  HENT:     Head: Normocephalic and atraumatic.  Cardiovascular:     Pulses: Normal pulses.  Musculoskeletal:        General: No swelling, tenderness or deformity.     Comments: L shoulder (-) impingement signs.  Pain w/ internal rotation and abduction.  Minimal discomfort w/ forward flexion.  Skin:    General: Skin is warm and dry.  Neurological:     General: No focal deficit present.     Mental Status: She is alert and oriented to person, place, and time.  Psychiatric:        Mood and Affect: Mood normal.        Behavior: Behavior normal.        Thought Content: Thought content normal.           Assessment & Plan:   L shoulder pain- new.  Started in May.  Worse at night and with certain movements.  Has not tried NSAIDs due to hx of GI upset.  Will start low dose Meloxicam once daily with food and refer to Sports Med.  Pt expressed understanding and is in agreement w/ plan.

## 2023-06-08 NOTE — Patient Instructions (Addendum)
Follow up as needed or as scheduled We'll call you to schedule your Sports Med appt START the Meloxicam once daily w/ food for 7 days and then as needed ICE to help w/ pain relief Call with any questions or concerns Stay Safe!  Stay Healthy! Happy Bel[employer] Birthday!!

## 2023-06-14 ENCOUNTER — Ambulatory Visit: Payer: 59 | Admitting: Family Medicine

## 2023-06-14 ENCOUNTER — Other Ambulatory Visit: Payer: Self-pay

## 2023-06-14 ENCOUNTER — Encounter: Payer: Self-pay | Admitting: Family Medicine

## 2023-06-14 ENCOUNTER — Ambulatory Visit (INDEPENDENT_AMBULATORY_CARE_PROVIDER_SITE_OTHER): Payer: 59

## 2023-06-14 VITALS — BP 124/82 | HR 55 | Ht 66.0 in | Wt 162.0 lb

## 2023-06-14 DIAGNOSIS — G8929 Other chronic pain: Secondary | ICD-10-CM | POA: Diagnosis not present

## 2023-06-14 DIAGNOSIS — M25512 Pain in left shoulder: Secondary | ICD-10-CM

## 2023-06-14 NOTE — Patient Instructions (Signed)
Thank you for coming in today.   Please get an Xray today before you leave   I've referred you to Physical Therapy.  Let us know if you don't hear from them in one week.   Check back in 6 weeks 

## 2023-06-14 NOTE — Progress Notes (Signed)
   I, Stevenson Clinch, CMA acting as a scribe for Clementeen Graham, MD.  Courtney Calhoun is a 62 y.o. female who presents to [facility] at [facility] today for L shoulder pain ongoing since May. No injury, but she carries her purse and groceries w/ her L arm. Pt locates pain to lateral aspect radiating into the deltoid. Denies n/t/w in the arm or hand. Limited ROM with lateral raises, and reaching across the body. Mechanical sx present. Pt is RHD.   Radiates: deltoid Aggravates: aBd, IR,  Treatments tried: topical cream, meloxicam  Pertinent review of systems: No fever or chills  Relevant historical information: Hyperlipidemia   Exam:  BP 124/82   Pulse (!) 55   Ht 5\' 6"  (1.676 m)   Wt 162 lb (73.5 kg)   SpO2 99%   BMI 26.15 kg/m  General: Well Developed, well nourished, and in no acute distress.   MSK: Left shoulder: Normal-appearing Range of motion intact abduction.  Functional internal rotation limited to lumbar spine. External rotation is full. Strength intact. Positive Hawkins and Neer's test. Negative Yergason's and speeds test. Positive empty can test.    Lab and Radiology Results  Diagnostic Limited MSK Ultrasound of: Left shoulder Mild subacromial bursitis and AC DJD with effusion are present.  Otherwise the shoulder examination is normal on ultrasound. Impression: Subacromial bursitis  X-ray images left shoulder obtained today personally and independently interpreted Mild glenohumeral DJD.  No acute fractures. Await formal radiology review   Assessment and Plan: 62 y.o. female with chronic left shoulder pain.  Dominant pain thought to be subacromial bursitis and impingement.  After discussion plan to refer to PT.  Recheck in about 6 weeks.  If not improved consider injection or MRI.   PDMP not reviewed this encounter. Orders Placed This Encounter  Procedures   [place] LIMITED JOINT SPACE STRUCTURES UP LEFT(NO LINKED CHARGES)    Order Specific  Question:   Reason for Exam (SYMPTOM  OR DIAGNOSIS REQUIRED)    Answer:   left shoulder pain    Order Specific Question:   Preferred imaging location?    Answer:   [facility] Shoulder Left    Standing Status:   Future    Number of Occurrences:   1    Standing Expiration Date:   07/14/2023    Order Specific Question:   Reason for Exam (SYMPTOM  OR DIAGNOSIS REQUIRED)    Answer:   left shoulder pain    Order Specific Question:   Preferred imaging location?    Answer:   [facility]   Ambulatory referral to Physical Therapy    Referral Priority:   Routine    Referral Type:   Physical Medicine    Referral Reason:   Specialty Services Required    Requested Specialty:   Physical Therapy    Number of Visits Requested:   1   No orders of the defined types were placed in this encounter.    Discussed warning signs or symptoms. Please see discharge instructions. Patient expresses understanding.   The above documentation has been reviewed and is accurate and complete Clementeen Graham, M.D.

## 2023-06-20 NOTE — Therapy (Signed)
OUTPATIENT PHYSICAL THERAPY SHOULDER EVALUATION   Patient Name: Courtney Calhoun MRN: [id number] DOB:1961-02-25, 62 y.o., female Today's Date: 06/22/2023   END OF SESSION:  PT End of Session - 06/22/23 1400     Visit Number 1    Date for PT Re-Evaluation 08/17/23    Authorization Type [employer]    PT Start Time 1400    PT Stop Time 1445    PT Time Calculation (min) 45 min    Activity Tolerance Patient tolerated treatment well    Behavior During Therapy WFL for tasks assessed/performed             Past Medical History:  Diagnosis Date   Allergy    Anemia    Anxiety    Depression    Gilbert's syndrome    Glaucoma    Hematuria    Hyperlipidemia    Osteopenia    Past Surgical History:  Procedure Laterality Date   CHOLECYSTECTOMY     EYE SURGERY     INNER EAR SURGERY     WISDOM TOOTH EXTRACTION     Patient Active Problem List   Diagnosis Date Noted   ETD (Eustachian tube dysfunction), right 07/04/2020   Overweight (BMI 25.0-29.9) 02/24/2020   Pain in joint of right hip 10/30/2019   Lower limb pain, inferior, right 10/30/2019   Insomnia 08/26/2019   GERD (gastroesophageal reflux disease) 08/26/2019   Palpitations 08/26/2019   Unilateral osteoarthritis of first carpometacarpal (CMC) joint 10/15/2015   General medical examination 07/13/2011   Hyperlipidemia 11/08/2010   Allergic rhinitis 11/08/2010   Osteopenia 11/08/2010    PCP: Sheliah Hatch, MD   REFERRING PROVIDER: Rodolph Bong, MD   REFERRING DIAG: [phone] (ICD-10-CM) - Left shoulder pain, unspecified chronicity   THERAPY DIAG:  Acute pain of left shoulder  Stiffness of left shoulder, not elsewhere classified  Abnormal posture  Muscle weakness (generalized)  RATIONALE FOR EVALUATION AND TREATMENT: Rehabilitation  ONSET DATE: May 2024  NEXT MD VISIT: 07/24/2023   SUBJECTIVE:                                                                                                                                                                                                          SUBJECTIVE STATEMENT: Pt reports she started with a dull ache in her L arm in May 2024 that went away for a while after resting then came back. Now having difficulty raising her arm to the side and reaching back.  PAIN: Are you having pain? Yes: NPRS scale: 0/10 currently, up to 4/10  Pain location: L outer shoulder and upper arm  Pain description: mostly dull, occasional sharp pain if she over-exerts Aggravating factors: raising arm in abduction, fastening her bra, lifting something with L hand, sleeping on L side Relieving factors: stay away from triggering activity, ice, pain patches  PERTINENT HISTORY:  Osteopenia, GERD, anxiety, depression, insomnia  PRECAUTIONS: None  RED FLAGS: None  HAND DOMINANCE: Right  WEIGHT BEARING RESTRICTIONS: No  FALLS:  Has patient fallen in last 6 months? Yes. 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 No acute fractures.   06/14/23 - Diagnostic Limited MSK Ultrasound of: Left shoulder Mild subacromial bursitis and AC DJD with effusion are present.  Otherwise the shoulder examination is normal on ultrasound. Impression: Subacromial bursitis  PATIENT SURVEYS:  Quick Dash 18.2 / 100 = 18.2 %  COGNITION: Overall cognitive status: Within functional limits for tasks  assessed     SENSATION: WFL  POSTURE: rounded shoulders, forward head, and L shoulder mildly depressed and protracted  UPPER EXTREMITY ROM:   Active ROM Right eval Left eval  Shoulder flexion 161 144 - tight  Shoulder extension 66 38 ^  Shoulder abduction    Shoulder adduction 160 87 ^  Shoulder internal rotation FIR WNL FIR sacrum ^  Shoulder external rotation FER T4 FER T4  Elbow flexion    Elbow extension    Wrist flexion    Wrist extension    Wrist ulnar deviation    Wrist radial deviation    Wrist pronation    Wrist supination    (Blank rows = not tested, ^ = increased pain)  UPPER EXTREMITY MMT:  MMT Right eval Left eval  Shoulder flexion 5 4+ ^  Shoulder extension 4 4 ^  Shoulder abduction 5 3- ^  Shoulder adduction    Shoulder internal rotation 5  4- ^  Shoulder external rotation 4+ 4 ^  Middle trapezius    Lower trapezius    Elbow flexion    Elbow extension    Wrist flexion    Wrist extension    Wrist ulnar deviation    Wrist radial deviation    Wrist pronation    Wrist supination    Grip strength (lbs)    (Blank rows = not tested, ^ = increased pain)  SHOULDER SPECIAL TESTS: Impingement tests: Neer impingement test: positive , Hawkins/Kennedy impingement test: positive , and Painful arc test: positive  Rotator cuff assessment: Empty can test: negative and Full can test: negative  JOINT MOBILITY TESTING:  Mildly restricted  PALPATION:  TTP in L anterolateral deltoid and pecs    TODAY'S TREATMENT:   06/22/23 - Eval THERAPEUTIC EXERCISE: to improve flexibility, strength and mobility.  Demonstration, verbal and tactile cues throughout for technique. L 3-way doorway pec stretch at 60/90/120 x 30" each - cues to limit motion to the point of stretch without increased pain Seated scapular retraction and depression 10 x 5" Seated scap retraction +  B shoulder ER 10 x 5   PATIENT EDUCATION:  Education details: PT eval findings, anticipated POC,  initial HEP, and postural awareness  Person educated: Patient Education method: Explanation, Demonstration, Verbal cues, and Handouts Education comprehension: verbalized understanding, returned demonstration, verbal cues required, and needs further education  HOME EXERCISE PROGRAM: Access Code: [email or web address] URL: [email or web address] Date: 06/22/2023 Prepared by: Glenetta Hew  Exercises - Doorway Pec Stretch at 60 Degrees Abduction with Arm Straight (Mirrored)  - 2 x daily - 7 x weekly - 3 reps - 30 sec hold - Single Arm Doorway Pec Stretch at 90 Degrees Abduction (Mirrored)  - 2 x daily - 7 x weekly - 3 reps - 30 sec hold - Single Arm Doorway Pec Stretch at 120 Degrees Abduction (Mirrored)  - 2 x daily - 7 x weekly - 3 reps - 30 sec hold - Seated Scapular Retraction  - 2 x daily - 7 x weekly - 2 sets - 10 reps - 5 sec hold - Shoulder External Rotation and Scapular Retraction  - 2 x daily - 7 x weekly - 2 sets - 10 reps - 3 sec hold   ASSESSMENT:  CLINICAL IMPRESSION: Courtney Calhoun is a 62 y.o. female who was referred to physical therapy for evaluation and treatment for acute/chronic L shoulder pain.  She reports onset of pain in May of this year which started as a dull ache that resolved after taking a break from exercising but then returned and persists now.  Current deficits include abnormal posture, pain and limited L shoulder ROM in a capsular pattern, mildly restricted L shoulder joint mobility, positive impingement sign, abnormal muscle tension and TTP in L anterolateral deltoid and lateral pecs, and L shoulder and scapular weakness.  Pain and LOM limit performance of daily ADLs especially activities requiring her to reach behind her back.  Lattie will benefit from skilled PT to address above deficits to improve mobility and activity tolerance with decreased pain interference.   OBJECTIVE IMPAIRMENTS: decreased activity tolerance, decreased knowledge of condition, decreased  ROM, decreased strength, hypomobility, increased fascial restrictions, impaired perceived functional ability, increased muscle spasms, impaired flexibility, impaired UE functional use, improper body mechanics, postural dysfunction, and pain.   ACTIVITY LIMITATIONS: lifting, sleeping, bathing, dressing, reach over head, and hygiene/grooming  PARTICIPATION LIMITATIONS: meal prep, cleaning, laundry, community activity, and occupation  PERSONAL FACTORS: Past/current experiences, Time since onset of injury/illness/exacerbation, and 3+ comorbidities: Osteopenia, GERD, anxiety, depression, insomnia  are also affecting patient's functional outcome.   REHAB POTENTIAL: Good  CLINICAL DECISION MAKING: Stable/uncomplicated  EVALUATION COMPLEXITY: Low   GOALS: Goals reviewed with patient? Yes  SHORT TERM GOALS: Target date: 07/20/2023  Patient will be independent with initial HEP to improve outcomes and carryover.  Baseline:  Goal status: INITIAL  2.  Patient will report 25% reduction in L shoulder pain. Baseline: Up to 4/10 with certain movements Goal status: INITIAL  3.  Patient will demonstrate improved scapulohumeral kinematics for increased L shoulder ROM. Baseline:  Goal status: INITIAL  LONG TERM GOALS: Target date: 08/17/2023  Patient will be independent with ongoing/advanced HEP for self-management at home.  Baseline:  Goal status: INITIAL  2.  Patient will report 50-75% improvement in L shoulder pain to improve QOL.  Baseline: Up to 4/10 with certain movements Goal status: INITIAL  3.  Patient to demonstrate improved upright posture with posterior shoulder girdle engaged to promote improved glenohumeral joint mobility. Baseline: fwd head, rounded shoulders, and L shoulder mildly depressed and protracted  Goal status: INITIAL  4.  Patient to improve L shoulder AROM to WFL/WNL without pain provocation to allow for increased ease of ADLs.  Baseline: Refer to above UE ROM  table Goal status: INITIAL  5.  Patient will demonstrate improved L shoulder strength to >/= 4+/5 for functional UE use. Baseline: Refer to above UE MMT table Goal status: INITIAL  6  Patient will report </= 8% on QuickDASH to demonstrate improved functional ability.  Baseline: 18.2 / 100 = 18.2 % Goal status: INITIAL  7.  Patient will resume working out at normal frequency without limitation due to L shoulder pain or LOM. Baseline: Patient has cut back on workouts to 1 time a week due to L shoulder pain Goal status: INITIAL    PLAN:  PT FREQUENCY: 2x/week  PT DURATION: 8 weeks  PLANNED INTERVENTIONS: Therapeutic exercises, Therapeutic activity, Neuromuscular re-education, Patient/Family education, Self Care, Joint mobilization, Dry Needling, Electrical stimulation, Spinal mobilization, Cryotherapy, Moist heat, Splintting, Taping, Vasopneumatic device, Ultrasound, Ionotophoresis 4mg /ml Dexamethasone, Manual therapy, and Re-evaluation  PLAN FOR NEXT SESSION: Review initial HEP; progress postural strengthening; introduce gentle L shoulder AAROM and L shoulder isometrics; MT +/- DN and/or modalities including possible ionto for pain management as needed   Marry Guan, PT 06/22/2023, 7:32 PM  [facility] at [facility] [address]  [address] [place], [place], [address] Phone: [phone]   Fax:  [phone]

## 2023-06-22 ENCOUNTER — Ambulatory Visit: Payer: 59 | Attending: Family Medicine | Admitting: Physical Therapy

## 2023-06-22 ENCOUNTER — Encounter: Payer: Self-pay | Admitting: Physical Therapy

## 2023-06-22 ENCOUNTER — Other Ambulatory Visit: Payer: Self-pay

## 2023-06-22 DIAGNOSIS — R293 Abnormal posture: Secondary | ICD-10-CM | POA: Diagnosis present

## 2023-06-22 DIAGNOSIS — M25512 Pain in left shoulder: Secondary | ICD-10-CM | POA: Insufficient documentation

## 2023-06-22 DIAGNOSIS — M6281 Muscle weakness (generalized): Secondary | ICD-10-CM | POA: Diagnosis present

## 2023-06-22 DIAGNOSIS — M25612 Stiffness of left shoulder, not elsewhere classified: Secondary | ICD-10-CM | POA: Diagnosis present

## 2023-06-22 DIAGNOSIS — G8929 Other chronic pain: Secondary | ICD-10-CM | POA: Diagnosis not present

## 2023-06-26 NOTE — Progress Notes (Signed)
Left shoulder x-ray looks normal to radiology

## 2023-06-29 ENCOUNTER — Ambulatory Visit: Payer: 59

## 2023-07-10 ENCOUNTER — Ambulatory Visit: Payer: 59 | Attending: Family Medicine

## 2023-07-10 DIAGNOSIS — M25612 Stiffness of left shoulder, not elsewhere classified: Secondary | ICD-10-CM | POA: Insufficient documentation

## 2023-07-10 DIAGNOSIS — M6281 Muscle weakness (generalized): Secondary | ICD-10-CM | POA: Diagnosis present

## 2023-07-10 DIAGNOSIS — M25512 Pain in left shoulder: Secondary | ICD-10-CM | POA: Diagnosis present

## 2023-07-10 DIAGNOSIS — R293 Abnormal posture: Secondary | ICD-10-CM | POA: Insufficient documentation

## 2023-07-10 NOTE — Therapy (Signed)
OUTPATIENT PHYSICAL THERAPY SHOULDER TREATMENT   Patient Name: Courtney Calhoun MRN: [id number] DOB:[birth year], 62 y.o., female Today's Date: 07/10/2023   END OF SESSION:  PT End of Session - 07/10/23 1435     Visit Number 2    Date for PT Re-Evaluation 08/17/23    Authorization Type [employer]    PT Start Time 1403    PT Stop Time 1444    PT Time Calculation (min) 41 min    Activity Tolerance Patient tolerated treatment well    Behavior During Therapy WFL for tasks assessed/performed              Past Medical History:  Diagnosis Date   Allergy    Anemia    Anxiety    Depression    Gilbert's syndrome    Glaucoma    Hematuria    Hyperlipidemia    Osteopenia    Past Surgical History:  Procedure Laterality Date   CHOLECYSTECTOMY     EYE SURGERY     INNER EAR SURGERY     WISDOM TOOTH EXTRACTION     Patient Active Problem List   Diagnosis Date Noted   ETD (Eustachian tube dysfunction), right 07/04/2020   Overweight (BMI 25.0-29.9) 02/24/2020   Pain in joint of right hip 10/30/2019   Lower limb pain, inferior, right 10/30/2019   Insomnia 08/26/2019   GERD (gastroesophageal reflux disease) 08/26/2019   Palpitations 08/26/2019   Unilateral osteoarthritis of first carpometacarpal (CMC) joint 10/15/2015   General medical examination 07/13/2011   Hyperlipidemia 11/08/2010   Allergic rhinitis 11/08/2010   Osteopenia 11/08/2010    PCP: Sheliah Hatch, MD   REFERRING PROVIDER: Rodolph Bong, MD   REFERRING DIAG: [phone] (ICD-10-CM) - Left shoulder pain, unspecified chronicity   THERAPY DIAG:  Acute pain of left shoulder  Stiffness of left shoulder, not elsewhere classified  Abnormal posture  Muscle weakness (generalized)  RATIONALE FOR EVALUATION AND TREATMENT: Rehabilitation  ONSET DATE: May 2024  NEXT MD VISIT: 07/24/2023   SUBJECTIVE:                                                                                                                                                                                                          SUBJECTIVE STATEMENT: Pt reports  very mild pain today and doing HEP.  PAIN: Are you having pain? Yes: NPRS scale: 2/10 Pain location: L outer shoulder and upper arm  Pain description: mostly dull, occasional sharp pain if she over-exerts Aggravating factors: raising arm in abduction, fastening her bra, lifting something with  L hand, sleeping on L side Relieving factors: stay away from triggering activity, ice, pain patches  PERTINENT HISTORY:  Osteopenia, GERD, anxiety, depression, insomnia  PRECAUTIONS: None  RED FLAGS: None  HAND DOMINANCE: Right  WEIGHT BEARING RESTRICTIONS: No  FALLS:  Has patient fallen in last 6 months? Yes. 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 No acute fractures.   06/14/23 - Diagnostic Limited MSK Ultrasound of: Left shoulder Mild subacromial bursitis and AC DJD with effusion are present.  Otherwise the shoulder examination is normal on ultrasound. Impression: Subacromial bursitis  PATIENT SURVEYS:  Quick Dash 18.2 / 100 = 18.2 %  COGNITION: Overall cognitive status: Within functional limits for tasks assessed     SENSATION: WFL  POSTURE: rounded shoulders, forward head, and L shoulder mildly depressed and protracted  UPPER EXTREMITY ROM:   Active ROM Right eval Left  eval  Shoulder flexion 161 144 - tight  Shoulder extension 66 38 ^  Shoulder abduction    Shoulder adduction 160 87 ^  Shoulder internal rotation FIR WNL FIR sacrum ^  Shoulder external rotation FER T4 FER T4  Elbow flexion    Elbow extension    Wrist flexion    Wrist extension    Wrist ulnar deviation    Wrist radial deviation    Wrist pronation    Wrist supination    (Blank rows = not tested, ^ = increased pain)  UPPER EXTREMITY MMT:  MMT Right eval Left eval  Shoulder flexion 5 4+ ^  Shoulder extension 4 4 ^  Shoulder abduction 5 3- ^  Shoulder adduction    Shoulder internal rotation 5  4- ^  Shoulder external rotation 4+ 4 ^  Middle trapezius    Lower trapezius    Elbow flexion    Elbow extension    Wrist flexion    Wrist extension    Wrist ulnar deviation    Wrist radial deviation    Wrist pronation    Wrist supination    Grip strength (lbs)    (Blank rows = not tested, ^ = increased pain)  SHOULDER SPECIAL TESTS: Impingement tests: Neer impingement test: positive , Hawkins/Kennedy impingement test: positive , and Painful arc test: positive  Rotator cuff assessment: Empty can test: negative and Full can test: negative  JOINT MOBILITY TESTING:  Mildly restricted  PALPATION:  TTP in L anterolateral deltoid and pecs    TODAY'S TREATMENT:  07/10/23/24  THERAPEUTIC EXERCISE: to improve flexibility, strength and mobility.  Demonstration, verbal and tactile cues throughout for technique. UBE x 6 min; 3 min fwd/ 3 min back Standing R/L ER YTB 2x10; IR YTB 2x10- many cues to avoid compensation S/L L shoulder ER 1# x 10 - cues to slow down motion and for hold at top to fully engage IS S/L L shoulder abduction x 10 - cues to depress shoulders S/L L shoulder flexion x 10 - cues to depress shoulder S/L  L shoulder horizontal ABD x 10   06/22/23 - Eval THERAPEUTIC EXERCISE: to improve flexibility, strength and mobility.  Demonstration, verbal and tactile cues  throughout for technique. L 3-way doorway pec stretch at 60/90/120 x 30" each - cues to limit motion to the point of stretch without increased pain Seated scapular retraction and depression 10 x 5" Seated scap retraction + B shoulder ER 10 x 5   PATIENT EDUCATION:  Education details: PT eval findings, anticipated POC, initial HEP, and postural awareness  Person educated: Patient Education method: Explanation, Demonstration, Verbal cues, and Handouts Education comprehension: verbalized understanding, returned demonstration, verbal cues required, and needs further education  HOME EXERCISE PROGRAM: Access Code: [email or web address] URL: [email or web address] Date: 07/10/2023 Prepared by: Verta Ellen  Exercises - Doorway Pec Stretch at 60 Degrees Abduction with Arm Straight (Mirrored)  - 2 x daily - 7 x weekly - 3 reps - 30 sec hold - Single Arm Doorway Pec Stretch at 90 Degrees Abduction (Mirrored)  - 2 x daily - 7 x weekly - 3 reps - 30 sec hold - Single Arm Doorway Pec Stretch at 120 Degrees Abduction (Mirrored)  - 2 x daily - 7 x weekly - 3 reps - 30 sec hold - Seated Scapular Retraction  - 2 x daily - 7 x weekly - 2 sets - 10 reps - 5 sec hold - Shoulder External Rotation and Scapular Retraction  - 2 x daily - 7 x weekly - 2 sets - 10 reps - 3 sec hold - Sidelying Shoulder Abduction Palm Forward  - 1 x daily - 7 x weekly - 2 sets - 10 reps - Sidelying Shoulder Flexion 15 Degrees  - 1 x daily - 7 x weekly - 2 sets - 10 reps - Sidelying Shoulder Horizontal Abduction  - 1 x daily - 7 x weekly - 2 sets - 10 reps   ASSESSMENT:  CLINICAL IMPRESSION: Pt tolerated the progression of exercises well. Needed many cues to avoid compensation and target the desired muscles during session. Needs cues to depress her scapula as she raises her L shoulder as well. No pain with interventions but many cues required.  Courtney Calhoun will benefit from skilled PT to address above deficits to improve mobility  and activity tolerance with decreased pain interference.   OBJECTIVE IMPAIRMENTS: decreased activity tolerance, decreased knowledge of condition, decreased ROM, decreased strength, hypomobility, increased fascial restrictions, impaired perceived functional ability, increased muscle spasms, impaired flexibility, impaired UE functional use, improper body mechanics, postural dysfunction, and pain.   ACTIVITY LIMITATIONS: lifting, sleeping, bathing, dressing, reach over head, and hygiene/grooming  PARTICIPATION LIMITATIONS: meal prep, cleaning, laundry, community activity, and occupation  PERSONAL FACTORS: Past/current experiences, Time since onset of injury/illness/exacerbation, and 3+ comorbidities: Osteopenia, GERD, anxiety, depression, insomnia  are also affecting patient's functional outcome.   REHAB POTENTIAL: Good  CLINICAL DECISION MAKING: Stable/uncomplicated  EVALUATION COMPLEXITY: Low   GOALS: Goals reviewed with patient? Yes  SHORT TERM GOALS: Target date: 07/20/2023  Patient will be independent with initial HEP to improve outcomes and carryover.  Baseline:  Goal status: IN PROGRESS  2.  Patient will report 25% reduction in L shoulder pain. Baseline: Up to 4/10 with certain movements Goal status: IN PROGRESS  3.  Patient will demonstrate improved scapulohumeral kinematics for increased L shoulder ROM. Baseline:  Goal status: IN PROGRESS  LONG TERM GOALS: Target date: 08/17/2023  Patient will be independent with ongoing/advanced HEP for self-management at home.  Baseline:  Goal status: IN PROGRESS  2.  Patient will report 50-75% improvement in L shoulder pain to improve QOL.  Baseline: Up to 4/10 with certain movements Goal status: IN PROGRESS  3.  Patient to demonstrate improved upright posture with posterior shoulder girdle engaged to promote improved glenohumeral joint mobility. Baseline: fwd head, rounded shoulders, and L shoulder mildly depressed and  protracted Goal status: IN PROGRESS  4.  Patient to improve L shoulder AROM to WFL/WNL without pain provocation to allow for increased ease of ADLs.  Baseline: Refer to above UE ROM table Goal status: IN PROGRESS  5.  Patient will demonstrate improved L shoulder strength to >/= 4+/5 for functional UE use. Baseline: Refer to above UE MMT table Goal status: IN PROGRESS  6  Patient will report </= 8% on QuickDASH to demonstrate improved functional ability.  Baseline: 18.2 / 100 = 18.2 % Goal status: IN PROGRESS  7.  Patient will resume working out at normal frequency without limitation due to L shoulder pain or LOM. Baseline: Patient has cut back on workouts to 1 time a week due to L shoulder pain Goal status: IN PROGRESS    PLAN:  PT FREQUENCY: 2x/week  PT DURATION: 8 weeks  PLANNED INTERVENTIONS: Therapeutic exercises, Therapeutic activity, Neuromuscular re-education, Patient/Family education, Self Care, Joint mobilization, Dry Needling, Electrical stimulation, Spinal mobilization, Cryotherapy, Moist heat, Splintting, Taping, Vasopneumatic device, Ultrasound, Ionotophoresis 4mg /ml Dexamethasone, Manual therapy, and Re-evaluation  PLAN FOR NEXT SESSION: progress postural strengthening; introduce gentle L shoulder AAROM and L shoulder isometrics; MT +/- DN and/or modalities including possible ionto for pain management as needed   Darleene Cleaver, PTA 07/10/2023, 2:45 PM  [facility] at [facility] [address]  [address] [place], [place], [address] Phone: [phone]   Fax:  [phone]

## 2023-07-13 ENCOUNTER — Ambulatory Visit: Payer: 59 | Admitting: Physical Therapy

## 2023-07-13 ENCOUNTER — Encounter: Payer: Self-pay | Admitting: Physical Therapy

## 2023-07-13 DIAGNOSIS — M25512 Pain in left shoulder: Secondary | ICD-10-CM

## 2023-07-13 DIAGNOSIS — M6281 Muscle weakness (generalized): Secondary | ICD-10-CM

## 2023-07-13 DIAGNOSIS — R293 Abnormal posture: Secondary | ICD-10-CM

## 2023-07-13 DIAGNOSIS — M25612 Stiffness of left shoulder, not elsewhere classified: Secondary | ICD-10-CM

## 2023-07-13 LAB — HM PAP SMEAR: HPV, high-risk: NEGATIVE

## 2023-07-13 NOTE — Therapy (Signed)
OUTPATIENT PHYSICAL THERAPY TREATMENT   Patient Name: Courtney Calhoun MRN: [id number] DOB:1960/11/17, 62 y.o., female Today's Date: 07/13/2023   END OF SESSION:  PT End of Session - 07/13/23 1527     Visit Number 3    Date for PT Re-Evaluation 08/17/23    Authorization Type U[employer]    PT Start Time 1528    PT Stop Time 1613    PT Time Calculation (min) 45 min    Activity Tolerance Patient tolerated treatment well    Behavior During Therapy WFL for tasks assessed/performed               Past Medical History:  Diagnosis Date   Allergy    Anemia    Anxiety    Depression    Gilbert's syndrome    Glaucoma    Hematuria    Hyperlipidemia    Osteopenia    Past Surgical History:  Procedure Laterality Date   CHOLECYSTECTOMY     EYE SURGERY     INNER EAR SURGERY     WISDOM TOOTH EXTRACTION     Patient Active Problem List   Diagnosis Date Noted   ETD (Eustachian tube dysfunction), right 07/04/2020   Overweight (BMI 25.0-29.9) 02/24/2020   Pain in joint of right hip 10/30/2019   Lower limb pain, inferior, right 10/30/2019   Insomnia 08/26/2019   GERD (gastroesophageal reflux disease) 08/26/2019   Palpitations 08/26/2019   Unilateral osteoarthritis of first carpometacarpal (CMC) joint 10/15/2015   General medical examination 07/13/2011   Hyperlipidemia 11/08/2010   Allergic rhinitis 11/08/2010   Osteopenia 11/08/2010    PCP: Sheliah Hatch, MD   REFERRING PROVIDER: Rodolph Bong, MD   REFERRING DIAG: [phone] (ICD-10-CM) - Left shoulder pain, unspecified chronicity   THERAPY DIAG:  Acute pain of left shoulder  Stiffness of left shoulder, not elsewhere classified  Abnormal posture  Muscle weakness (generalized)  RATIONALE FOR EVALUATION AND TREATMENT: Rehabilitation  ONSET DATE: May 2024  NEXT MD VISIT: 07/24/2023   SUBJECTIVE:                                                                                                                                                                                                          SUBJECTIVE STATEMENT: Pt reports she doesn't usually really feel the pain until at night, esp sleeping on L side.  PAIN: Are you having pain? Yes: NPRS scale:  3/10 Pain location: L outer shoulder and upper arm  Pain description: mostly dull, occasional sharp pain if she over-exerts Aggravating factors: raising arm  in abduction, fastening her bra, lifting something with L hand, sleeping on L side Relieving factors: stay away from triggering activity, ice, pain patches  PERTINENT HISTORY:  Osteopenia, GERD, anxiety, depression, insomnia  PRECAUTIONS: None  RED FLAGS: None  HAND DOMINANCE: Right  WEIGHT BEARING RESTRICTIONS: No  FALLS:  Has patient fallen in last 6 months? Yes. 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 No acute fractures.   06/14/23 - Diagnostic Limited MSK Ultrasound of: Left shoulder Mild subacromial bursitis and AC DJD with effusion are present.  Otherwise the shoulder examination is normal on ultrasound. Impression: Subacromial bursitis  PATIENT SURVEYS:  Quick Dash 18.2 / 100 = 18.2 %  COGNITION: Overall cognitive status: Within functional limits for tasks assessed     SENSATION: WFL  POSTURE: rounded shoulders, forward head, and L shoulder mildly depressed and protracted  UPPER  EXTREMITY ROM:   Active ROM Right eval Left eval  Shoulder flexion 161 144 - tight  Shoulder extension 66 38 ^  Shoulder abduction 160 87 ^  Shoulder adduction    Shoulder internal rotation FIR WNL FIR sacrum ^  Shoulder external rotation FER T4 FER T4  Elbow flexion    Elbow extension    Wrist flexion    Wrist extension    Wrist ulnar deviation    Wrist radial deviation    Wrist pronation    Wrist supination    (Blank rows = not tested, ^ = increased pain)  UPPER EXTREMITY MMT:  MMT Right eval Left eval  Shoulder flexion 5 4+ ^  Shoulder extension 4 4 ^  Shoulder abduction 5 3- ^  Shoulder adduction    Shoulder internal rotation 5  4- ^  Shoulder external rotation 4+ 4 ^  Middle trapezius    Lower trapezius    Elbow flexion    Elbow extension    Wrist flexion    Wrist extension    Wrist ulnar deviation    Wrist radial deviation    Wrist pronation    Wrist supination    Grip strength (lbs)    (Blank rows = not tested, ^ = increased pain)  SHOULDER SPECIAL TESTS: Impingement tests: Neer impingement test: positive , Hawkins/Kennedy impingement test: positive , and Painful arc test: positive  Rotator cuff assessment: Empty can test: negative and Full can test: negative  JOINT MOBILITY TESTING:  Mildly restricted  PALPATION:  TTP in L anterolateral deltoid and pecs    TODAY'S TREATMENT:   07/13/23 THERAPEUTIC EXERCISE: to improve flexibility, strength and mobility.  Demonstration, verbal and tactile cues throughout for technique. UBE - L1.5 x 6 min (3' each fwd & back) S/L L shoulder horizontal ABD x 10 S/L L shoulder abduction 2 x 10 - facilitation for scapular motion with cues to depress shoulders Seated L shoulder submax (~30% effort) isometric IR & ER 10 x 5"  MANUAL THERAPY: To promote normalized muscle tension, improved flexibility, improved joint  mobility, increased ROM, and reduced pain.  STM/DTM and manual TPR to L pec minor and anterolateral  deltoids L shoulder distraction & inferior glides   MODALITIES: Iontophoresis with 1.0 mL 4mg /ml Dexamethasone - 4-6 hour patch (24mA-min) to L anterolateral shoulder (patch #1 of 6)   07/10/23 THERAPEUTIC EXERCISE: to improve flexibility, strength and mobility.  Demonstration, verbal and tactile cues throughout for technique. UBE x 6 min; 3 min fwd/ 3 min back Standing R/L ER YTB 2x10; IR YTB 2x10- many cues to avoid compensation S/L L shoulder ER 1# x 10 - cues to slow down motion and for hold at top to fully engage IS S/L L shoulder abduction x 10 - cues to depress shoulders S/L L shoulder flexion x 10 - cues to depress shoulder S/L L shoulder horizontal ABD x 10    06/22/23 - Eval THERAPEUTIC EXERCISE: to improve flexibility, strength and mobility.  Demonstration, verbal and tactile cues throughout for technique. L 3-way doorway pec stretch at 60/90/120 x 30" each - cues to limit motion to the point of stretch without increased pain Seated scapular retraction and depression 10 x 5" Seated scap retraction + B shoulder ER 10 x 5   PATIENT EDUCATION:  Education details: HEP review and Ionto patch wearing instructions  Person educated: Patient Education method: Programmer, multimedia, Demonstration, Verbal cues, and Handouts Education comprehension: verbalized understanding, returned demonstration, verbal cues required, and needs further education  HOME EXERCISE PROGRAM: Access Code: [email or web address] URL: [email or web address] Date: 07/10/2023 Prepared by: Verta Ellen  Exercises - Doorway Pec Stretch at 60 Degrees Abduction with Arm Straight (Mirrored)  - 2 x daily - 7 x weekly - 3 reps - 30 sec hold - Single Arm Doorway Pec Stretch at 90 Degrees Abduction (Mirrored)  - 2 x daily - 7 x weekly - 3 reps - 30 sec hold - Single Arm Doorway Pec Stretch at 120 Degrees Abduction (Mirrored)  - 2 x daily - 7 x weekly - 3 reps - 30 sec hold - Seated Scapular Retraction  - 2 x daily - 7 x  weekly - 2 sets - 10 reps - 5 sec hold - Shoulder External Rotation and Scapular Retraction  - 2 x daily - 7 x weekly - 2 sets - 10 reps - 3 sec hold - Sidelying Shoulder Abduction Palm Forward  - 1 x daily - 7 x weekly - 2 sets - 10 reps - Sidelying Shoulder Flexion 15 Degrees  - 1 x daily - 7 x weekly - 2 sets - 10 reps - Sidelying Shoulder Horizontal Abduction  - 1 x daily - 7 x weekly - 2 sets - 10 reps   ASSESSMENT:  CLINICAL IMPRESSION: Courtney Calhoun requested clarification with one of the HEP exercises (S/L horizontal ABD) but otherwise reports HEP going well.  Above exercise reviewed with clarification for scapular activation and to avoid shoulder shrug as well as to distinguish this exercise from S/L ER. Scapular facilitation necessary during S/L shoulder ABD but pt able to achieve significantly higher ROM w/o pain. Session concluded with initial trial of ionto patch to L anterolateral shoulder.  Nafeesa will benefit from skilled PT to address above deficits to improve mobility and activity tolerance with decreased pain interference.   OBJECTIVE IMPAIRMENTS: decreased activity tolerance, decreased knowledge of condition, decreased ROM, decreased strength, hypomobility, increased fascial restrictions, impaired perceived functional ability, increased muscle spasms, impaired flexibility, impaired UE functional use, improper body mechanics, postural dysfunction, and pain.   ACTIVITY LIMITATIONS: lifting, sleeping, bathing, dressing,  reach over head, and hygiene/grooming  PARTICIPATION LIMITATIONS: meal prep, cleaning, laundry, community activity, and occupation  PERSONAL FACTORS: Past/current experiences, Time since onset of injury/illness/exacerbation, and 3+ comorbidities: Osteopenia, GERD, anxiety, depression, insomnia  are also affecting patient's functional outcome.   REHAB POTENTIAL: Good  CLINICAL DECISION MAKING: Stable/uncomplicated  EVALUATION COMPLEXITY: Low   GOALS: Goals  reviewed with patient? Yes  SHORT TERM GOALS: Target date: 07/20/2023  Patient will be independent with initial HEP to improve outcomes and carryover.  Baseline:  Goal status: IN PROGRESS - 07/13/23 - needs further review  2.  Patient will report 25% reduction in L shoulder pain. Baseline: Up to 4/10 with certain movements Goal status: IN PROGRESS  3.  Patient will demonstrate improved scapulohumeral kinematics for increased L shoulder ROM. Baseline:  Goal status: IN PROGRESS - 07/13/23 - facilitation necessary for coordinated scapular motion  LONG TERM GOALS: Target date: 08/17/2023  Patient will be independent with ongoing/advanced HEP for self-management at home.  Baseline:  Goal status: IN PROGRESS  2.  Patient will report 50-75% improvement in L shoulder pain to improve QOL.  Baseline: Up to 4/10 with certain movements Goal status: IN PROGRESS  3.  Patient to demonstrate improved upright posture with posterior shoulder girdle engaged to promote improved glenohumeral joint mobility. Baseline: fwd head, rounded shoulders, and L shoulder mildly depressed and protracted Goal status: IN PROGRESS  4.  Patient to improve L shoulder AROM to WFL/WNL without pain provocation to allow for increased ease of ADLs.  Baseline: Refer to above UE ROM table Goal status: IN PROGRESS  5.  Patient will demonstrate improved L shoulder strength to >/= 4+/5 for functional UE use. Baseline: Refer to above UE MMT table Goal status: IN PROGRESS  6  Patient will report </= 8% on QuickDASH to demonstrate improved functional ability.  Baseline: 18.2 / 100 = 18.2 % Goal status: IN PROGRESS  7.  Patient will resume working out at normal frequency without limitation due to L shoulder pain or LOM. Baseline: Patient has cut back on workouts to 1 time a week due to L shoulder pain Goal status: IN PROGRESS    PLAN:  PT FREQUENCY: 2x/week  PT DURATION: 8 weeks  PLANNED INTERVENTIONS: Therapeutic  exercises, Therapeutic activity, Neuromuscular re-education, Patient/Family education, Self Care, Joint mobilization, Dry Needling, Electrical stimulation, Spinal mobilization, Cryotherapy, Moist heat, Splintting, Taping, Vasopneumatic device, Ultrasound, Ionotophoresis 4mg /ml Dexamethasone, Manual therapy, and Re-evaluation  PLAN FOR NEXT SESSION: Assess response to ionto patch; progress postural strengthening; introduce gentle L shoulder AAROM and L shoulder isometrics; MT +/- DN and/or modalities including ionto as benefit noted for pain management as needed   Marry Guan, PT 07/13/2023, 5:44 PM  [facility] at [facility] [address]  [address] [place], [place], [address] Phone: [phone]   Fax:  [phone]

## 2023-07-17 ENCOUNTER — Ambulatory Visit: Payer: 59

## 2023-07-17 DIAGNOSIS — M25512 Pain in left shoulder: Secondary | ICD-10-CM | POA: Diagnosis not present

## 2023-07-17 DIAGNOSIS — M25612 Stiffness of left shoulder, not elsewhere classified: Secondary | ICD-10-CM

## 2023-07-17 DIAGNOSIS — M6281 Muscle weakness (generalized): Secondary | ICD-10-CM

## 2023-07-17 DIAGNOSIS — R293 Abnormal posture: Secondary | ICD-10-CM

## 2023-07-17 NOTE — Therapy (Signed)
OUTPATIENT PHYSICAL THERAPY TREATMENT   Patient Name: Courtney Calhoun MRN: [id number] DOB:[birth year], 62 y.o., female Today's Date: 07/17/2023   END OF SESSION:  PT End of Session - 07/17/23 1444     Visit Number 4    Date for PT Re-Evaluation 08/17/23    Authorization Type [employer]    PT Start Time 1402    PT Stop Time 1443    PT Time Calculation (min) 41 min    Activity Tolerance Patient tolerated treatment well    Behavior During Therapy WFL for tasks assessed/performed                Past Medical History:  Diagnosis Date   Allergy    Anemia    Anxiety    Depression    Gilbert's syndrome    Glaucoma    Hematuria    Hyperlipidemia    Osteopenia    Past Surgical History:  Procedure Laterality Date   CHOLECYSTECTOMY     EYE SURGERY     INNER EAR SURGERY     WISDOM TOOTH EXTRACTION     Patient Active Problem List   Diagnosis Date Noted   ETD (Eustachian tube dysfunction), right 07/04/2020   Overweight (BMI 25.0-29.9) 02/24/2020   Pain in joint of right hip 10/30/2019   Lower limb pain, inferior, right 10/30/2019   Insomnia 08/26/2019   GERD (gastroesophageal reflux disease) 08/26/2019   Palpitations 08/26/2019   Unilateral osteoarthritis of first carpometacarpal (CMC) joint 10/15/2015   General medical examination 07/13/2011   Hyperlipidemia 11/08/2010   Allergic rhinitis 11/08/2010   Osteopenia 11/08/2010    PCP: Sheliah Hatch, MD   REFERRING PROVIDER: Rodolph Bong, MD   REFERRING DIAG: [phone] (ICD-10-CM) - Left shoulder pain, unspecified chronicity   THERAPY DIAG:  Acute pain of left shoulder  Stiffness of left shoulder, not elsewhere classified  Abnormal posture  Muscle weakness (generalized)  RATIONALE FOR EVALUATION AND TREATMENT: Rehabilitation  ONSET DATE: May 2024  NEXT MD VISIT: 07/24/2023   SUBJECTIVE:                                                                                                                                                                                                          SUBJECTIVE STATEMENT: Pt reports her pain is better today, doesn't know if it is the patch or exercises.  PAIN: Are you having pain? Yes: NPRS scale: 2/10 Pain location: L outer shoulder and upper arm  Pain description: mostly dull, occasional sharp pain if she over-exerts Aggravating factors: raising arm in  abduction, fastening her bra, lifting something with L hand, sleeping on L side Relieving factors: stay away from triggering activity, ice, pain patches  PERTINENT HISTORY:  Osteopenia, GERD, anxiety, depression, insomnia  PRECAUTIONS: None  RED FLAGS: None  HAND DOMINANCE: Right  WEIGHT BEARING RESTRICTIONS: No  FALLS:  Has patient fallen in last 6 months? Yes. 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 No acute fractures.   06/14/23 - Diagnostic Limited MSK Ultrasound of: Left shoulder Mild subacromial bursitis and AC DJD with effusion are present.  Otherwise the shoulder examination is normal on ultrasound. Impression: Subacromial bursitis  PATIENT SURVEYS:  Quick Dash 18.2 / 100 = 18.2 %  COGNITION: Overall cognitive status: Within functional limits for tasks assessed     SENSATION: WFL  POSTURE: rounded shoulders, forward head, and L shoulder mildly depressed and protracted  UPPER EXTREMITY ROM:    Active ROM Right eval Left eval  Shoulder flexion 161 144 - tight  Shoulder extension 66 38 ^  Shoulder abduction 160 87 ^  Shoulder adduction    Shoulder internal rotation FIR WNL FIR sacrum ^  Shoulder external rotation FER T4 FER T4  Elbow flexion    Elbow extension    Wrist flexion    Wrist extension    Wrist ulnar deviation    Wrist radial deviation    Wrist pronation    Wrist supination    (Blank rows = not tested, ^ = increased pain)  UPPER EXTREMITY MMT:  MMT Right eval Left eval  Shoulder flexion 5 4+ ^  Shoulder extension 4 4 ^  Shoulder abduction 5 3- ^  Shoulder adduction    Shoulder internal rotation 5  4- ^  Shoulder external rotation 4+ 4 ^  Middle trapezius    Lower trapezius    Elbow flexion    Elbow extension    Wrist flexion    Wrist extension    Wrist ulnar deviation    Wrist radial deviation    Wrist pronation    Wrist supination    Grip strength (lbs)    (Blank rows = not tested, ^ = increased pain)  SHOULDER SPECIAL TESTS: Impingement tests: Neer impingement test: positive , Hawkins/Kennedy impingement test: positive , and Painful arc test: positive  Rotator cuff assessment: Empty can test: negative and Full can test: negative  JOINT MOBILITY TESTING:  Mildly restricted  PALPATION:  TTP in L anterolateral deltoid and pecs    TODAY'S TREATMENT:  07/17/23 THERAPEUTIC EXERCISE: to improve flexibility, strength and mobility.  Demonstration, verbal and tactile cues throughout for technique. UBE - L1.5 x 6 min (3' each fwd & back) S/L L shoulder horizontal ABD x 10 S/L L shoulder abduction 2 x 10 - facilitation for scapular motion with cues to depress shoulders S/L L shoulder ER 1# 2x10- towel under elbow S/L L shoulder flexion x 10 Seated scapular retraction and ER YTB x 10  Wall wash 3  way x 10  MANUAL THERAPY: To promote normalized muscle tension, improved flexibility, improved joint mobility, increased ROM, and reduced pain.  L GH  joint inferior glides for abduction PROM into abduction and flexion   07/13/23 THERAPEUTIC EXERCISE: to improve flexibility, strength and mobility.  Demonstration, verbal and tactile cues throughout for technique. UBE - L1.5 x 6 min (3' each fwd & back) S/L L shoulder horizontal ABD x 10 S/L L shoulder abduction 2 x 10 - facilitation for scapular motion with cues to depress shoulders Seated L shoulder submax (~30% effort) isometric IR & ER 10 x 5"  MANUAL THERAPY: To promote normalized muscle tension, improved flexibility, improved joint mobility, increased ROM, and reduced pain.  STM/DTM and manual TPR to L pec minor and anterolateral deltoids L shoulder distraction & inferior glides   MODALITIES: Iontophoresis with 1.0 mL 4mg /ml Dexamethasone - 4-6 hour patch (64mA-min) to L anterolateral shoulder (patch #1 of 6)   07/10/23 THERAPEUTIC EXERCISE: to improve flexibility, strength and mobility.  Demonstration, verbal and tactile cues throughout for technique. UBE x 6 min; 3 min fwd/ 3 min back Standing R/L ER YTB 2x10; IR YTB 2x10- many cues to avoid compensation S/L L shoulder ER 1# x 10 - cues to slow down motion and for hold at top to fully engage IS S/L L shoulder abduction x 10 - cues to depress shoulders S/L L shoulder flexion x 10 - cues to depress shoulder S/L L shoulder horizontal ABD x 10    06/22/23 - Eval THERAPEUTIC EXERCISE: to improve flexibility, strength and mobility.  Demonstration, verbal and tactile cues throughout for technique. L 3-way doorway pec stretch at 60/90/120 x 30" each - cues to limit motion to the point of stretch without increased pain Seated scapular retraction and depression 10 x 5" Seated scap retraction + B shoulder ER 10 x 5   PATIENT EDUCATION:  Education details: HEP review and Ionto patch wearing instructions  Person educated: Patient Education method: Programmer, multimedia, Demonstration, Verbal cues, and Handouts Education comprehension:  verbalized understanding, returned demonstration, verbal cues required, and needs further education  HOME EXERCISE PROGRAM: Access Code: [email or web address] URL: [email or web address] Date: 07/10/2023 Prepared by: Verta Ellen  Exercises - Doorway Pec Stretch at 60 Degrees Abduction with Arm Straight (Mirrored)  - 2 x daily - 7 x weekly - 3 reps - 30 sec hold - Single Arm Doorway Pec Stretch at 90 Degrees Abduction (Mirrored)  - 2 x daily - 7 x weekly - 3 reps - 30 sec hold - Single Arm Doorway Pec Stretch at 120 Degrees Abduction (Mirrored)  - 2 x daily - 7 x weekly - 3 reps - 30 sec hold - Seated Scapular Retraction  - 2 x daily - 7 x weekly - 2 sets - 10 reps - 5 sec hold - Shoulder External Rotation and Scapular Retraction  - 2 x daily - 7 x weekly - 2 sets - 10 reps - 3 sec hold - Sidelying Shoulder Abduction Palm Forward  - 1 x daily - 7 x weekly - 2 sets - 10 reps - Sidelying Shoulder Flexion 15 Degrees  - 1 x daily - 7 x weekly - 2 sets - 10 reps - Sidelying Shoulder Horizontal Abduction  - 1 x daily - 7 x weekly - 2 sets - 10 reps   ASSESSMENT:  CLINICAL IMPRESSION: Pt able to complete all interventions. Progressed RTC and shoulder strengthening. Gentle progression of ER strengthening to YTB for HEP and edu given on performing  every other day. Abduction AROM remains limited d/t pain but improved ROM after joint mobs.  Pt does see benefit from ionto patch. Railey will benefit from skilled PT to address above deficits to improve mobility and activity tolerance with decreased pain interference.   OBJECTIVE IMPAIRMENTS: decreased activity tolerance, decreased knowledge of condition, decreased ROM, decreased strength, hypomobility, increased fascial restrictions, impaired perceived functional ability, increased muscle spasms, impaired flexibility, impaired UE functional use, improper body mechanics, postural dysfunction, and pain.   ACTIVITY LIMITATIONS: lifting, sleeping, bathing,  dressing, reach over head, and hygiene/grooming  PARTICIPATION LIMITATIONS: meal prep, cleaning, laundry, community activity, and occupation  PERSONAL FACTORS: Past/current experiences, Time since onset of injury/illness/exacerbation, and 3+ comorbidities: Osteopenia, GERD, anxiety, depression, insomnia  are also affecting patient's functional outcome.   REHAB POTENTIAL: Good  CLINICAL DECISION MAKING: Stable/uncomplicated  EVALUATION COMPLEXITY: Low   GOALS: Goals reviewed with patient? Yes  SHORT TERM GOALS: Target date: 07/20/2023  Patient will be independent with initial HEP to improve outcomes and carryover.  Baseline:  Goal status: IN PROGRESS - 07/13/23 - needs further review  2.  Patient will report 25% reduction in L shoulder pain. Baseline: Up to 4/10 with certain movements Goal status: IN PROGRESS  3.  Patient will demonstrate improved scapulohumeral kinematics for increased L shoulder ROM. Baseline:  Goal status: IN PROGRESS - 07/13/23 - facilitation necessary for coordinated scapular motion  LONG TERM GOALS: Target date: 08/17/2023  Patient will be independent with ongoing/advanced HEP for self-management at home.  Baseline:  Goal status: IN PROGRESS  2.  Patient will report 50-75% improvement in L shoulder pain to improve QOL.  Baseline: Up to 4/10 with certain movements Goal status: IN PROGRESS  3.  Patient to demonstrate improved upright posture with posterior shoulder girdle engaged to promote improved glenohumeral joint mobility. Baseline: fwd head, rounded shoulders, and L shoulder mildly depressed and protracted Goal status: IN PROGRESS  4.  Patient to improve L shoulder AROM to WFL/WNL without pain provocation to allow for increased ease of ADLs.  Baseline: Refer to above UE ROM table Goal status: IN PROGRESS  5.  Patient will demonstrate improved L shoulder strength to >/= 4+/5 for functional UE use. Baseline: Refer to above UE MMT table Goal  status: IN PROGRESS  6  Patient will report </= 8% on QuickDASH to demonstrate improved functional ability.  Baseline: 18.2 / 100 = 18.2 % Goal status: IN PROGRESS  7.  Patient will resume working out at normal frequency without limitation due to L shoulder pain or LOM. Baseline: Patient has cut back on workouts to 1 time a week due to L shoulder pain Goal status: IN PROGRESS    PLAN:  PT FREQUENCY: 2x/week  PT DURATION: 8 weeks  PLANNED INTERVENTIONS: Therapeutic exercises, Therapeutic activity, Neuromuscular re-education, Patient/Family education, Self Care, Joint mobilization, Dry Needling, Electrical stimulation, Spinal mobilization, Cryotherapy, Moist heat, Splintting, Taping, Vasopneumatic device, Ultrasound, Ionotophoresis 4mg /ml Dexamethasone, Manual therapy, and Re-evaluation  PLAN FOR NEXT SESSION: progress postural strengthening; introduce gentle L shoulder AAROM and L shoulder isometrics; MT +/- DN and/or modalities including ionto as benefit noted for pain management as needed   Darleene Cleaver, PTA 07/17/2023, 3:12 PM  [facility] [address]  [address] [place], [place], [address] Phone: [phone]   Fax:  [phone]

## 2023-07-20 ENCOUNTER — Ambulatory Visit: Payer: 59 | Admitting: Physical Therapy

## 2023-07-20 ENCOUNTER — Encounter: Payer: Self-pay | Admitting: Physical Therapy

## 2023-07-20 DIAGNOSIS — M25612 Stiffness of left shoulder, not elsewhere classified: Secondary | ICD-10-CM

## 2023-07-20 DIAGNOSIS — R293 Abnormal posture: Secondary | ICD-10-CM

## 2023-07-20 DIAGNOSIS — M6281 Muscle weakness (generalized): Secondary | ICD-10-CM

## 2023-07-20 DIAGNOSIS — M25512 Pain in left shoulder: Secondary | ICD-10-CM | POA: Diagnosis not present

## 2023-07-20 NOTE — Therapy (Signed)
OUTPATIENT PHYSICAL THERAPY TREATMENT  Progress Note  Reporting Period 06/22/2023 to 07/20/2023   See note below for Objective Data and Assessment of Progress/Goals.     Patient Name: Courtney Calhoun MRN: [id number] DOB:1961-05-11, 62 y.o., female Today's Date: 07/20/2023   END OF SESSION:  PT End of Session - 07/20/23 1447     Visit Number 5    Date for PT Re-Evaluation 08/17/23    Authorization Type [employer]    PT Start Time 1447    PT Stop Time 1529    PT Time Calculation (min) 42 min    Activity Tolerance Patient tolerated treatment well    Behavior During Therapy WFL for tasks assessed/performed                 Past Medical History:  Diagnosis Date   Allergy    Anemia    Anxiety    Depression    Gilbert's syndrome    Glaucoma    Hematuria    Hyperlipidemia    Osteopenia    Past Surgical History:  Procedure Laterality Date   CHOLECYSTECTOMY     EYE SURGERY     INNER EAR SURGERY     WISDOM TOOTH EXTRACTION     Patient Active Problem List   Diagnosis Date Noted   ETD (Eustachian tube dysfunction), right 07/04/2020   Overweight (BMI 25.0-29.9) 02/24/2020   Pain in joint of right hip 10/30/2019   Lower limb pain, inferior, right 10/30/2019   Insomnia 08/26/2019   GERD (gastroesophageal reflux disease) 08/26/2019   Palpitations 08/26/2019   Unilateral osteoarthritis of first carpometacarpal (CMC) joint 10/15/2015   General medical examination 07/13/2011   Hyperlipidemia 11/08/2010   Allergic rhinitis 11/08/2010   Osteopenia 11/08/2010    PCP: Sheliah Hatch, MD   REFERRING PROVIDER: Rodolph Bong, MD   REFERRING DIAG: [phone] (ICD-10-CM) - Left shoulder pain, unspecified chronicity   THERAPY DIAG:  Acute pain of left shoulder  Stiffness of left shoulder, not elsewhere classified  Abnormal posture  Muscle weakness (generalized)  RATIONALE FOR EVALUATION AND TREATMENT: Rehabilitation  ONSET DATE: May 2024  NEXT MD VISIT:  07/24/2023   SUBJECTIVE:                                                                                                                                                                                                         SUBJECTIVE STATEMENT: Pt reports her pain is better today, just a little sore, but feels like she can move it better w/o it hurting as bad.  PAIN:  Are you having pain? Yes: NPRS scale:  3/10 Pain location: L outer shoulder and upper arm  Pain description: sore Aggravating factors: raising arm in abduction, fastening her bra, lifting something with L hand, sleeping on L side Relieving factors: stay away from triggering activity, ice, pain patches  PERTINENT HISTORY:  Osteopenia, GERD, anxiety, depression, insomnia  PRECAUTIONS: None  RED FLAGS: None  HAND DOMINANCE: Right  WEIGHT BEARING RESTRICTIONS: No  FALLS:  Has patient fallen in last 6 months? Yes. 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 No acute fractures.   06/14/23 - Diagnostic Limited MSK Ultrasound of: Left shoulder Mild subacromial bursitis and AC DJD with effusion are present.  Otherwise the shoulder examination is normal on ultrasound. Impression: Subacromial bursitis  PATIENT SURVEYS:  Quick Dash 18.2 / 100 = 18.2 %  COGNITION: Overall cognitive status: Within functional limits for tasks  assessed     SENSATION: WFL  POSTURE: rounded shoulders, forward head, and L shoulder mildly depressed and protracted  UPPER EXTREMITY ROM:   Active ROM Right eval Left eval L 07/20/23  Shoulder flexion 161 144 - tight 142 - tight  Shoulder extension 66 38 ^ 40 ^  Shoulder abduction 160 87 ^ 124 ^  Shoulder adduction     Shoulder internal rotation FIR WNL FIR sacrum ^ FIR sacrum ^  Shoulder external rotation FER T4 FER T4 FER T4  Elbow flexion     Elbow extension     Wrist flexion     Wrist extension     Wrist ulnar deviation     Wrist radial deviation     Wrist pronation     Wrist supination     (Blank rows = not tested, ^ = increased pain)  UPPER EXTREMITY MMT:  MMT Right eval Left eval  Shoulder flexion 5 4+ ^  Shoulder extension 4 4 ^  Shoulder abduction 5 3- ^  Shoulder adduction    Shoulder internal rotation 5  4- ^  Shoulder external rotation 4+ 4 ^  Middle trapezius    Lower trapezius    Elbow flexion    Elbow extension    Wrist flexion    Wrist extension    Wrist ulnar deviation    Wrist radial deviation    Wrist pronation    Wrist supination    Grip strength (lbs)    (Blank rows = not tested, ^ = increased pain)  SHOULDER SPECIAL TESTS: Impingement tests: Neer impingement test: positive , Hawkins/Kennedy impingement test: positive , and Painful arc test: positive  Rotator cuff assessment: Empty can test: negative and Full can test: negative  JOINT MOBILITY TESTING:  Mildly restricted  PALPATION:  TTP in L anterolateral deltoid and pecs    TODAY'S TREATMENT:   07/20/23 THERAPEUTIC EXERCISE: to improve flexibility, strength and mobility.  Demonstration, verbal and tactile cues throughout for technique. UBE - L2.0 x 6 min (3' each fwd & back) Supine  L shoulder flexion focusing on dropping humeral head with OH elevation x 10 Supine L shoulder protraction/retraction at 90 flexion2# x 10 Supine L shoulder CW/CCW circles at 90 flexion 2# x 10  each  Supine L shoulder rhythmic stabilization at 90 flexion 2 x 30"  THERAPEUTIC ACTIVITIES: L UE ROM Goal assessment  MANUAL THERAPY: To promote normalized muscle tension, improved flexibility, improved joint mobility, increased ROM, and reduced pain. Skilled palpation and monitoring of soft tissue during DN Trigger Point Dry-Needling  Treatment instructions: Expect mild to moderate muscle soreness. Patient verbalized understanding of these instructions and education. Patient Consent Given: Yes Education handout provided: Yes Muscles treated: L anterolateral deltoid Electrical stimulation performed: No Parameters: N/A Treatment response/outcome: Twitch Response Elicited and Palpable Increase in Muscle Length STM/DTM, manual TPR and pin & stretch to L anterior, lateral and posterior deltoid, triceps, teres group and lats  MODALITIES: Iontophoresis with 1.0 mL 4mg /ml Dexamethasone - 4-6 hour patch (11mA-min) to L anterolateral shoulder (patch #2 of 6)   07/17/23 THERAPEUTIC EXERCISE: to improve flexibility, strength and mobility.  Demonstration, verbal and tactile cues throughout for technique. UBE - L1.5 x 6 min (3' each fwd & back) S/L L shoulder horizontal ABD x 10 S/L L shoulder abduction 2 x 10 - facilitation for scapular motion with cues to depress shoulders S/L L shoulder ER 1# 2x10- towel under elbow S/L L shoulder flexion x 10 Seated scapular retraction and ER YTB x 10  Wall wash 3 way x 10  MANUAL THERAPY: To promote normalized muscle tension, improved flexibility, improved joint mobility, increased ROM, and reduced pain.  L GH joint inferior glides for abduction PROM into abduction and flexion   07/13/23 THERAPEUTIC EXERCISE: to improve flexibility, strength and mobility.  Demonstration, verbal and tactile cues throughout for technique. UBE - L1.5 x 6 min (3' each fwd & back) S/L L shoulder horizontal ABD x 10 S/L L shoulder abduction 2 x 10 - facilitation for  scapular motion with cues to depress shoulders Seated L shoulder submax (~30% effort) isometric IR & ER 10 x 5"  MANUAL THERAPY: To promote normalized muscle tension, improved flexibility, improved joint mobility, increased ROM, and reduced pain.  STM/DTM and manual TPR to L pec minor and anterolateral deltoids L shoulder distraction & inferior glides   MODALITIES: Iontophoresis with 1.0 mL 4mg /ml Dexamethasone - 4-6 hour patch (21mA-min) to L anterolateral shoulder (patch #1 of 6)   PATIENT EDUCATION:  Education details: HEP review and Ionto patch wearing instructions  Person educated: Patient Education method: Programmer, multimedia, Demonstration, Verbal cues, and Handouts Education comprehension: verbalized understanding, returned demonstration, verbal cues required, and needs further education  HOME EXERCISE PROGRAM: Access Code: [email or web address] URL: [email or web address] Date: 07/10/2023 Prepared by: Verta Ellen  Exercises - Doorway Pec Stretch at 60 Degrees Abduction with Arm Straight (Mirrored)  - 2 x daily - 7 x weekly - 3 reps - 30 sec hold - Single Arm Doorway Pec Stretch at 90 Degrees Abduction (Mirrored)  - 2 x daily - 7 x weekly - 3 reps - 30 sec hold - Single Arm Doorway Pec Stretch at 120 Degrees Abduction (Mirrored)  - 2 x daily - 7 x weekly - 3 reps - 30 sec hold - Seated Scapular Retraction  - 2 x daily - 7 x weekly - 2 sets - 10 reps - 5 sec hold - Shoulder External Rotation and Scapular Retraction  - 2 x daily - 7 x weekly - 2 sets - 10 reps - 3  sec hold - Sidelying Shoulder Abduction Palm Forward  - 1 x daily - 7 x weekly - 2 sets - 10 reps - Sidelying Shoulder Flexion 15 Degrees  - 1 x daily - 7 x weekly - 2 sets - 10 reps - Sidelying Shoulder Horizontal Abduction  - 1 x daily - 7 x weekly - 2 sets - 10 reps   ASSESSMENT:  CLINICAL IMPRESSION: Courtney Calhoun reports ~10% improvement in her pain, noting she can move her shoulder better without it hurting as bad.   Significant gains noted in L shoulder abduction, however remaining motions without significant change and pain still present with motions behind her back.  Taut, tender bands localized in anterior and lateral deltoids where patient notes restriction with motion occurs - these appeared amenable to DN. After explanation of DN rational, procedures, outcomes and potential side effects, patient verbalized consent to DN treatment in conjunction with manual STM/DTM and TPR to reduce ttp/muscle tension. Muscles treated as indicated above. DN produced normal response with good twitches elicited resulting in palpable reduction in pain/ttp and muscle tension, however patient deferring more than 1 treatment site today.  Pt educated to expect mild to moderate muscle soreness for up to 24-48 hrs and instructed to continue prescribed HEP and current activity level with pt verbalizing understanding of these instructions.  Continue to work on scapular stabilization and scapulohumeral kinematics following MT and and DN.  Session concluded with 2nd application of Iontopatch as patient noting benefit from initial application.  Courtney Calhoun is demonstrating progress towards her PT goals and will benefit from skilled PT to address ongoing muscle tension, range of motion and strength deficits to improve mobility and activity tolerance with decreased pain interference.   OBJECTIVE IMPAIRMENTS: decreased activity tolerance, decreased knowledge of condition, decreased ROM, decreased strength, hypomobility, increased fascial restrictions, impaired perceived functional ability, increased muscle spasms, impaired flexibility, impaired UE functional use, improper body mechanics, postural dysfunction, and pain.   ACTIVITY LIMITATIONS: lifting, sleeping, bathing, dressing, reach over head, and hygiene/grooming  PARTICIPATION LIMITATIONS: meal prep, cleaning, laundry, community activity, and occupation  PERSONAL FACTORS: Past/current experiences,  Time since onset of injury/illness/exacerbation, and 3+ comorbidities: Osteopenia, GERD, anxiety, depression, insomnia  are also affecting patient's functional outcome.   REHAB POTENTIAL: Good  CLINICAL DECISION MAKING: Stable/uncomplicated  EVALUATION COMPLEXITY: Low   GOALS: Goals reviewed with patient? Yes  SHORT TERM GOALS: Target date: 07/20/2023  Patient will be independent with initial HEP to improve outcomes and carryover.  Baseline:  Goal status: MET - 07/20/23   2.  Patient will report 25% reduction in L shoulder pain. Baseline: Up to 4/10 with certain movements Goal status: IN PROGRESS - 07/20/23 - Pt reports 10% improvement  3.  Patient will demonstrate improved scapulohumeral kinematics for increased L shoulder ROM. Baseline:  Goal status: IN PROGRESS - 07/13/23 - facilitation necessary for coordinated scapular motion  LONG TERM GOALS: Target date: 08/17/2023  Patient will be independent with ongoing/advanced HEP for self-management at home.  Baseline:  Goal status: IN PROGRESS  2.  Patient will report 50-75% improvement in L shoulder pain to improve QOL.  Baseline: Up to 4/10 with certain movements Goal status: IN PROGRESS - 07/20/23 - Pt reports 10% improvement  3.  Patient to demonstrate improved upright posture with posterior shoulder girdle engaged to promote improved glenohumeral joint mobility. Baseline: fwd head, rounded shoulders, and L shoulder mildly depressed and protracted Goal status: IN PROGRESS   4.  Patient to improve L shoulder AROM to WFL/WNL  without pain provocation to allow for increased ease of ADLs.  Baseline: Refer to above UE ROM table Goal status: IN PROGRESS - 07/20/23 - significant gains noted in L shoulder abduction, however remaining motions essentially unchanged  5.  Patient will demonstrate improved L shoulder strength to >/= 4+/5 for functional UE use. Baseline: Refer to above UE MMT table Goal status: IN PROGRESS  6   Patient will report </= 8% on QuickDASH to demonstrate improved functional ability.  Baseline: 18.2 / 100 = 18.2 % Goal status: IN PROGRESS  7.  Patient will resume working out at normal frequency without limitation due to L shoulder pain or LOM. Baseline: Patient has cut back on workouts to 1 time a week due to L shoulder pain Goal status: IN PROGRESS    PLAN:  PT FREQUENCY: 2x/week  PT DURATION: 8 weeks  PLANNED INTERVENTIONS: Therapeutic exercises, Therapeutic activity, Neuromuscular re-education, Patient/Family education, Self Care, Joint mobilization, Dry Needling, Electrical stimulation, Spinal mobilization, Cryotherapy, Moist heat, Splintting, Taping, Vasopneumatic device, Ultrasound, Ionotophoresis 4mg /ml Dexamethasone, Manual therapy, and Re-evaluation  PLAN FOR NEXT SESSION: F/u on MD visit; assess response to DN; progress postural strengthening; introduce gentle L shoulder AAROM and L shoulder isometrics; MT +/- DN and/or modalities including ionto #3 as benefit noted for pain management PRN  Marry Guan, PT 07/20/2023, 6:21 PM  [facility] [address]  [address] [place], [place], [address] Phone: [phone]   Fax:  [phone]

## 2023-07-24 ENCOUNTER — Ambulatory Visit: Payer: 59 | Admitting: Family Medicine

## 2023-07-24 VITALS — BP 140/92 | HR 58 | Ht 66.0 in | Wt 161.0 lb

## 2023-07-24 DIAGNOSIS — G8929 Other chronic pain: Secondary | ICD-10-CM | POA: Diagnosis not present

## 2023-07-24 DIAGNOSIS — M25512 Pain in left shoulder: Secondary | ICD-10-CM | POA: Diagnosis not present

## 2023-07-24 NOTE — Patient Instructions (Signed)
Thank you for coming in today.   Continue physical therapy  Check back in 1 month

## 2023-07-24 NOTE — Progress Notes (Signed)
   Rubin Payor, PhD, LAT, ATC acting as a scribe for Clementeen Graham, MD.  Courtney Calhoun is a 62 y.o. female who presents to [facility] at [facility] today for 6-wk f/u L shoulder pain. Pt was last seen by Dr. Denyse Amass on 06/14/23 and was referred to PT, completing 5 visits.   Today, pt reports L shoulder pain is improving, 40% improvement. She thinks she had dry needling at PT. Cont pain w/ IR and shoulder extension. She feels like her AROM has improved.   Dx imaging: 06/14/23 L shoulder XR  Pertinent review of systems: No fevers or chills  Relevant historical information: GERD   Exam:  BP (!) 140/92   Pulse (!) 58   Ht 5\' 6"  (1.676 m)   Wt 161 lb (73 kg)   SpO2 98%   BMI 25.99 kg/m  General: Well Developed, well nourished, and in no acute distress.   MSK: Left shoulder normal-appearing range of motion limited functional internal rotation to posterior iliac crest otherwise intact. Strength is intact. Positive Hawkins and Neer's test.       Assessment and Plan: 62 y.o. female with chronic left shoulder pain.  Pain thought to be due to shoulder impingement/bursitis.  Symptoms are improving with physical therapy but she is only about 40% better after 5 physical therapy sessions.  We talked about options.  Plan to give PT and home exercise another month.  If not better enough by then next step would probably be subacromial injection or perhaps even MRI.  Recheck in 1 month.   PDMP not reviewed this encounter. No orders of the defined types were placed in this encounter.  No orders of the defined types were placed in this encounter.    Discussed warning signs or symptoms. Please see discharge instructions. Patient expresses understanding.   The above documentation has been reviewed and is accurate and complete Clementeen Graham, M.D.

## 2023-07-27 ENCOUNTER — Ambulatory Visit: Payer: 59 | Admitting: Physical Therapy

## 2023-07-27 ENCOUNTER — Encounter: Payer: Self-pay | Admitting: Physical Therapy

## 2023-07-27 DIAGNOSIS — R293 Abnormal posture: Secondary | ICD-10-CM

## 2023-07-27 DIAGNOSIS — M25512 Pain in left shoulder: Secondary | ICD-10-CM | POA: Diagnosis not present

## 2023-07-27 DIAGNOSIS — M6281 Muscle weakness (generalized): Secondary | ICD-10-CM

## 2023-07-27 DIAGNOSIS — M25612 Stiffness of left shoulder, not elsewhere classified: Secondary | ICD-10-CM

## 2023-07-27 NOTE — Therapy (Signed)
OUTPATIENT PHYSICAL THERAPY TREATMENT    Patient Name: Courtney Calhoun MRN: [id number] DOB:13-Nov-1960, 62 y.o., female Today's Date: 07/27/2023   END OF SESSION:  PT End of Session - 07/27/23 1314     Visit Number 6    Date for PT Re-Evaluation 08/17/23    Authorization Type [employer]    Progress Note Due on Visit 15   MD PN on visit #5 - 07/20/23   PT Start Time 1314    PT Stop Time 1358    PT Time Calculation (min) 44 min    Activity Tolerance Patient tolerated treatment well    Behavior During Therapy WFL for tasks assessed/performed                  Past Medical History:  Diagnosis Date   Allergy    Anemia    Anxiety    Depression    Gilbert's syndrome    Glaucoma    Hematuria    Hyperlipidemia    Osteopenia    Past Surgical History:  Procedure Laterality Date   CHOLECYSTECTOMY     EYE SURGERY     INNER EAR SURGERY     WISDOM TOOTH EXTRACTION     Patient Active Problem List   Diagnosis Date Noted   ETD (Eustachian tube dysfunction), right 07/04/2020   Overweight (BMI 25.0-29.9) 02/24/2020   Pain in joint of right hip 10/30/2019   Lower limb pain, inferior, right 10/30/2019   Insomnia 08/26/2019   GERD (gastroesophageal reflux disease) 08/26/2019   Palpitations 08/26/2019   Unilateral osteoarthritis of first carpometacarpal (CMC) joint 10/15/2015   General medical examination 07/13/2011   Hyperlipidemia 11/08/2010   Allergic rhinitis 11/08/2010   Osteopenia 11/08/2010    PCP: Sheliah Hatch, MD   REFERRING PROVIDER: Rodolph Bong, MD   REFERRING DIAG: [phone] (ICD-10-CM) - Left shoulder pain, unspecified chronicity   THERAPY DIAG:  Acute pain of left shoulder  Stiffness of left shoulder, not elsewhere classified  Abnormal posture  Muscle weakness (generalized)  RATIONALE FOR EVALUATION AND TREATMENT: Rehabilitation  ONSET DATE: May 2024  NEXT MD VISIT: 08/21/2023   SUBJECTIVE:                                                                                                                                                                                                          SUBJECTIVE STATEMENT: Pt reports the MD wanted to give her a shot but she was not prepared for it at the time, so she will go back to the MD on 08/21/23.  She felt like the DN and the iontopatch was a lot in one visit last time and notes she was sore afterward.  PAIN: Are you having pain? Yes: NPRS scale:  4/10 Pain location: L outer shoulder and upper arm  Pain description: stiff Aggravating factors: raising arm in abduction, fastening her bra, lifting something with L hand, sleeping on L side Relieving factors: stay away from triggering activity, ice, pain patches  PERTINENT HISTORY:  Osteopenia, GERD, anxiety, depression, insomnia  PRECAUTIONS: None  RED FLAGS: None  HAND DOMINANCE: Right  WEIGHT BEARING RESTRICTIONS: No  FALLS:  Has patient fallen in last 6 months? Yes. 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 No acute fractures.   06/14/23 - Diagnostic Limited MSK Ultrasound of: Left shoulder Mild subacromial bursitis and AC DJD with effusion are present.  Otherwise the shoulder examination is normal on ultrasound. Impression: Subacromial bursitis  PATIENT SURVEYS:  Quick Dash 18.2 / 100 = 18.2 %  COGNITION: Overall cognitive status:  Within functional limits for tasks assessed     SENSATION: WFL  POSTURE: rounded shoulders, forward head, and L shoulder mildly depressed and protracted  UPPER EXTREMITY ROM:   Active ROM Right eval Left eval L 07/20/23  Shoulder flexion 161 144 - tight 142 - tight  Shoulder extension 66 38 ^ 40 ^  Shoulder abduction 160 87 ^ 124 ^  Shoulder adduction     Shoulder internal rotation FIR WNL FIR sacrum ^ FIR sacrum ^  Shoulder external rotation FER T4 FER T4 FER T4  Elbow flexion     Elbow extension     Wrist flexion     Wrist extension     Wrist ulnar deviation     Wrist radial deviation     Wrist pronation     Wrist supination     (Blank rows = not tested, ^ = increased pain)  UPPER EXTREMITY MMT:  MMT Right eval Left eval R 07/27/23 L 07/27/23  Shoulder flexion 5 4+ ^ 5 4+  Shoulder extension 4 4 ^ 4+ 4  Shoulder abduction 5 3- ^ 4+ 3+ ^  Shoulder adduction      Shoulder internal rotation 5  4- ^ 5 4 ^  Shoulder external rotation 4+ 4 ^ 4+ 4+  Middle trapezius      Lower trapezius      Elbow flexion      Elbow extension      Wrist flexion      Wrist extension      Wrist ulnar deviation      Wrist radial deviation      Wrist pronation      Wrist supination      Grip strength (lbs)      (Blank rows = not tested, ^ = increased pain)  SHOULDER SPECIAL TESTS: Impingement tests: Neer impingement test: positive , Hawkins/Kennedy impingement test: positive , and Painful arc test: positive  Rotator cuff  assessment: Empty can test: negative and Full can test: negative  JOINT MOBILITY TESTING:  Mildly restricted  PALPATION:  TTP in L anterolateral deltoid and pecs    TODAY'S TREATMENT:   07/27/23 THERAPEUTIC EXERCISE: to improve flexibility, strength and mobility.  Demonstration, verbal and tactile cues throughout for technique. Pulleys - Flexion and scaption x 3' each L shoulder wand AAROM - drawing up back and across back x 10 each L shoulder IR towel stretch  x 10 Standing RTB scap retraction + B shoulder row 10 x 3" Standing RTB scap retraction + B shoulder extension 10 x 3" - cues to avoid shoulder shrug Seated BATCA low row 10# x 10 Standing BATCA cable row 5# x 10 Standing BATCA cable shoulder extension 5# x 10 Standing BATCA lat pull down 10# x 10 Seated BATCA lat pull down 10# x 10  THERAPEUTIC ACTIVITIES: Shoulder MMT  MODALITIES: Iontophoresis with 1.0 mL 4mg /ml Dexamethasone - 4-6 hour patch (75mA-min) to L anterior shoulder (patch #3 of 6)   07/20/23 - PN THERAPEUTIC EXERCISE: to improve flexibility, strength and mobility.  Demonstration, verbal and tactile cues throughout for technique. UBE - L2.0 x 6 min (3' each fwd & back) Supine L shoulder flexion focusing on dropping humeral head with OH elevation x 10 Supine L shoulder protraction/retraction at 90 flexion2# x 10 Supine L shoulder CW/CCW circles at 90 flexion 2# x 10 each  Supine L shoulder rhythmic stabilization at 90 flexion 2 x 30"  THERAPEUTIC ACTIVITIES: L UE ROM Goal assessment  MANUAL THERAPY: To promote normalized muscle tension, improved flexibility, improved joint mobility, increased ROM, and reduced pain. Skilled palpation and monitoring of soft tissue during DN Trigger Point Dry-Needling  Treatment instructions: Expect mild to moderate muscle soreness. Patient verbalized understanding of these instructions and education. Patient Consent Given: Yes Education handout provided: Yes Muscles treated: L anterolateral deltoid Electrical stimulation performed: No Parameters: N/A Treatment response/outcome: Twitch Response Elicited and Palpable Increase in Muscle Length STM/DTM, manual TPR and pin & stretch to L anterior, lateral and posterior deltoid, triceps, teres group and lats  MODALITIES: Iontophoresis with 1.0 mL 4mg /ml Dexamethasone - 4-6 hour patch (65mA-min) to L anterolateral shoulder (patch #2 of 6)   07/17/23 THERAPEUTIC EXERCISE: to improve  flexibility, strength and mobility.  Demonstration, verbal and tactile cues throughout for technique. UBE - L1.5 x 6 min (3' each fwd & back) S/L L shoulder horizontal ABD x 10 S/L L shoulder abduction 2 x 10 - facilitation for scapular motion with cues to depress shoulders S/L L shoulder ER 1# 2x10- towel under elbow S/L L shoulder flexion x 10 Seated scapular retraction and ER YTB x 10  Wall wash 3 way x 10  MANUAL THERAPY: To promote normalized muscle tension, improved flexibility, improved joint mobility, increased ROM, and reduced pain.  L GH joint inferior glides for abduction PROM into abduction and flexion   PATIENT EDUCATION:  Education details: continue with current HEP and Ionto patch wearing instructions  Person educated: Patient Education method: Explanation Education comprehension: verbalized understanding  HOME EXERCISE PROGRAM: Access Code: [email or web address] URL: [email or web address] Date: 07/10/2023 Prepared by: Verta Ellen  Exercises - Doorway Pec Stretch at 60 Degrees Abduction with Arm Straight (Mirrored)  - 2 x daily - 7 x weekly - 3 reps - 30 sec hold - Single Arm Doorway Pec Stretch at 90 Degrees Abduction (Mirrored)  - 2 x daily - 7 x weekly - 3 reps - 30 sec hold - Single Arm Doorway Pec  Stretch at 120 Degrees Abduction (Mirrored)  - 2 x daily - 7 x weekly - 3 reps - 30 sec hold - Seated Scapular Retraction  - 2 x daily - 7 x weekly - 2 sets - 10 reps - 5 sec hold - Shoulder External Rotation and Scapular Retraction  - 2 x daily - 7 x weekly - 2 sets - 10 reps - 3 sec hold - Sidelying Shoulder Abduction Palm Forward  - 1 x daily - 7 x weekly - 2 sets - 10 reps - Sidelying Shoulder Flexion 15 Degrees  - 1 x daily - 7 x weekly - 2 sets - 10 reps - Sidelying Shoulder Horizontal Abduction  - 1 x daily - 7 x weekly - 2 sets - 10 reps   ASSESSMENT:  CLINICAL IMPRESSION: Courtney Calhoun reports soreness following the DN and Iontopatch last visit - reminded her  that soreness is expected after the DN and that this was likely the cause of the her increased soreness rather than the patch.  Continued progression of posterior shoulder/scapular strengthening today with instructions provided on how to perform similar exercises using the weight machines at the gym.  Patient encouraged to gradually incorporate posterior shoulder strengthening into her gym workouts, initially adding 1 machine per visit, progressing as tolerance allows.  Shoulder strength gradually improving with decreased pain on resisted motion noted today.  As patient noted benefit from initial Iontopatch and soreness noted after last visit more likely related to the DN, proceeded with 3rd Iontopatch application today.  Courtney Calhoun is demonstrating progress towards her PT goals and will benefit from skilled PT to address ongoing muscle tension, range of motion and strength deficits to improve mobility and activity tolerance with decreased pain interference.   OBJECTIVE IMPAIRMENTS: decreased activity tolerance, decreased knowledge of condition, decreased ROM, decreased strength, hypomobility, increased fascial restrictions, impaired perceived functional ability, increased muscle spasms, impaired flexibility, impaired UE functional use, improper body mechanics, postural dysfunction, and pain.   ACTIVITY LIMITATIONS: lifting, sleeping, bathing, dressing, reach over head, and hygiene/grooming  PARTICIPATION LIMITATIONS: meal prep, cleaning, laundry, community activity, and occupation  PERSONAL FACTORS: Past/current experiences, Time since onset of injury/illness/exacerbation, and 3+ comorbidities: Osteopenia, GERD, anxiety, depression, insomnia  are also affecting patient's functional outcome.   REHAB POTENTIAL: Good  CLINICAL DECISION MAKING: Stable/uncomplicated  EVALUATION COMPLEXITY: Low   GOALS: Goals reviewed with patient? Yes  SHORT TERM GOALS: Target date: 07/20/2023  Patient will be  independent with initial HEP to improve outcomes and carryover.  Baseline:  Goal status: MET - 07/20/23   2.  Patient will report 25% reduction in L shoulder pain. Baseline: Up to 4/10 with certain movements Goal status: IN PROGRESS - 07/20/23 - Pt reports 10% improvement  3.  Patient will demonstrate improved scapulohumeral kinematics for increased L shoulder ROM. Baseline:  Goal status: IN PROGRESS - 07/13/23 - facilitation necessary for coordinated scapular motion  LONG TERM GOALS: Target date: 08/17/2023  Patient will be independent with ongoing/advanced HEP for self-management at home.  Baseline:  Goal status: IN PROGRESS  2.  Patient will report 50-75% improvement in L shoulder pain to improve QOL.  Baseline: Up to 4/10 with certain movements Goal status: IN PROGRESS - 07/20/23 - Pt reports 10% improvement  3.  Patient to demonstrate improved upright posture with posterior shoulder girdle engaged to promote improved glenohumeral joint mobility. Baseline: fwd head, rounded shoulders, and L shoulder mildly depressed and protracted Goal status: IN PROGRESS   4.  Patient to improve  L shoulder AROM to WFL/WNL without pain provocation to allow for increased ease of ADLs.  Baseline: Refer to above UE ROM table Goal status: IN PROGRESS - 07/20/23 - significant gains noted in L shoulder abduction, however remaining motions essentially unchanged  5.  Patient will demonstrate improved L shoulder strength to >/= 4+/5 for functional UE use. Baseline: Refer to above UE MMT table Goal status: IN PROGRESS - 07/27/23 - strength improving with less pain reported with resisted movement  6  Patient will report </= 8% on QuickDASH to demonstrate improved functional ability.  Baseline: 18.2 / 100 = 18.2 % Goal status: IN PROGRESS  7.  Patient will resume working out at normal frequency without limitation due to L shoulder pain or LOM. Baseline: Patient has cut back on workouts to 1 time a week  due to L shoulder pain Goal status: IN PROGRESS - 07/27/23 - discussed gradual reincorporation of shoulders machines to gym workouts   PLAN:  PT FREQUENCY: 2x/week  PT DURATION: 8 weeks  PLANNED INTERVENTIONS: Therapeutic exercises, Therapeutic activity, Neuromuscular re-education, Patient/Family education, Self Care, Joint mobilization, Dry Needling, Electrical stimulation, Spinal mobilization, Cryotherapy, Moist heat, Splintting, Taping, Vasopneumatic device, Ultrasound, Ionotophoresis 4mg /ml Dexamethasone, Manual therapy, and Re-evaluation  PLAN FOR NEXT SESSION:  assess response to Iontopatch; progress postural strengthening and L shoulder isometrics/resisted strengthening; progress gentle L shoulder AA/AROM;   MT +/- DN and/or modalities including ionto #3 as benefit noted for pain management PRN  Marry Guan, PT 07/27/2023, 2:07 PM  [facility] [address]  [address] [place], [place], [address] Phone: [phone]   Fax:  [phone]

## 2023-07-28 ENCOUNTER — Other Ambulatory Visit: Payer: Self-pay | Admitting: Family Medicine

## 2023-07-28 LAB — HM MAMMOGRAPHY

## 2023-07-31 ENCOUNTER — Ambulatory Visit: Payer: 59

## 2023-07-31 ENCOUNTER — Encounter: Payer: Self-pay | Admitting: Family Medicine

## 2023-07-31 DIAGNOSIS — M25512 Pain in left shoulder: Secondary | ICD-10-CM | POA: Diagnosis not present

## 2023-07-31 DIAGNOSIS — M25612 Stiffness of left shoulder, not elsewhere classified: Secondary | ICD-10-CM

## 2023-07-31 DIAGNOSIS — R293 Abnormal posture: Secondary | ICD-10-CM

## 2023-07-31 DIAGNOSIS — M6281 Muscle weakness (generalized): Secondary | ICD-10-CM

## 2023-07-31 NOTE — Therapy (Signed)
OUTPATIENT PHYSICAL THERAPY TREATMENT    Patient Name: Courtney Calhoun MRN: [id number] DOB:[birth year], 62 y.o., female Today's Date: 07/31/2023   END OF SESSION:         Past Medical History:  Diagnosis Date   Allergy    Anemia    Anxiety    Depression    Gilbert's syndrome    Glaucoma    Hematuria    Hyperlipidemia    Osteopenia    Past Surgical History:  Procedure Laterality Date   CHOLECYSTECTOMY     EYE SURGERY     INNER EAR SURGERY     WISDOM TOOTH EXTRACTION     Patient Active Problem List   Diagnosis Date Noted   ETD (Eustachian tube dysfunction), right 07/04/2020   Overweight (BMI 25.0-29.9) 02/24/2020   Pain in joint of right hip 10/30/2019   Lower limb pain, inferior, right 10/30/2019   Insomnia 08/26/2019   GERD (gastroesophageal reflux disease) 08/26/2019   Palpitations 08/26/2019   Unilateral osteoarthritis of first carpometacarpal (CMC) joint 10/15/2015   General medical examination 07/13/2011   Hyperlipidemia 11/08/2010   Allergic rhinitis 11/08/2010   Osteopenia 11/08/2010    PCP: Courtney Hatch, MD   REFERRING PROVIDER: Rodolph Bong, MD   REFERRING DIAG: [phone] (ICD-10-CM) - Left shoulder pain, unspecified chronicity   THERAPY DIAG:  Acute pain of left shoulder  Stiffness of left shoulder, not elsewhere classified  Abnormal posture  Muscle weakness (generalized)  RATIONALE FOR EVALUATION AND TREATMENT: Rehabilitation  ONSET DATE: May 2024  NEXT MD VISIT: 08/21/2023   SUBJECTIVE:                                                                                                                                                                                                         SUBJECTIVE STATEMENT: Pt reports the ionto patch worked fine. She worked out at [employer] yesterday and is sore today.  PAIN: Are you having pain? Yes: NPRS scale:  4/10 Pain location: L outer shoulder and upper arm  Pain description:  sore Aggravating factors: raising arm in abduction, fastening her bra, lifting something with L hand, sleeping on L side Relieving factors: stay away from triggering activity, ice, pain patches  PERTINENT HISTORY:  Osteopenia, GERD, anxiety, depression, insomnia  PRECAUTIONS: None  RED FLAGS: None  HAND DOMINANCE: Right  WEIGHT BEARING RESTRICTIONS: No  FALLS:  Has patient fallen in last 6 months? Yes. 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 No acute fractures.   06/14/23 - Diagnostic Limited MSK Ultrasound of: Left shoulder Mild subacromial bursitis and AC DJD with effusion are present.  Otherwise the shoulder examination is normal on ultrasound. Impression: Subacromial bursitis  PATIENT SURVEYS:  Quick Dash 18.2 / 100 = 18.2 %  COGNITION: Overall cognitive status: Within functional limits for tasks assessed     SENSATION: WFL  POSTURE: rounded shoulders, forward head, and L shoulder mildly depressed and protracted  UPPER EXTREMITY ROM:   Active ROM Right eval Left eval L 07/20/23  Shoulder flexion 161 144 - tight 142 - tight  Shoulder extension 66 38 ^ 40 ^  Shoulder abduction 160 87 ^ 124 ^  Shoulder adduction     Shoulder internal rotation FIR WNL FIR sacrum ^ FIR sacrum ^  Shoulder external rotation FER T4 FER T4 FER T4  Elbow flexion     Elbow extension     Wrist flexion      Wrist extension     Wrist ulnar deviation     Wrist radial deviation     Wrist pronation     Wrist supination     (Blank rows = not tested, ^ = increased pain)  UPPER EXTREMITY MMT:  MMT Right eval Left eval R 07/27/23 L 07/27/23  Shoulder flexion 5 4+ ^ 5 4+  Shoulder extension 4 4 ^ 4+ 4  Shoulder abduction 5 3- ^ 4+ 3+ ^  Shoulder adduction      Shoulder internal rotation 5  4- ^ 5 4 ^  Shoulder external rotation 4+ 4 ^ 4+ 4+  Middle trapezius      Lower trapezius      Elbow flexion      Elbow extension      Wrist flexion      Wrist extension      Wrist ulnar deviation      Wrist radial deviation      Wrist pronation      Wrist supination      Grip strength (lbs)      (Blank rows = not tested, ^ = increased pain)  SHOULDER SPECIAL TESTS: Impingement tests: Neer impingement test: positive , Hawkins/Kennedy impingement test: positive , and Painful arc test: positive  Rotator cuff assessment: Empty can test: negative and Full can test: negative  JOINT MOBILITY TESTING:  Mildly restricted  PALPATION:  TTP in L anterolateral deltoid and pecs    TODAY'S TREATMENT:  07/31/23 THERAPEUTIC EXERCISE: to improve flexibility, strength and mobility.  Demonstration, verbal and tactile cues throughout for technique. UBE - L2.0 x 6 min (3' each fwd & back) Supine L shoulder flexion 2x10 AROM Supine L shoulder CW/CCW circles 2x10 both ways 2# Supine L scap protraction 2x10 2# S/L L shoulder flexion 2x10 S/L L shoulder ER x 10 2# Seated L shoulder row and ext bent over 2# x 10  L shoulder IR behind back with strap x 10   MANUAL THERAPY: To promote normalized muscle tension, improved flexibility, improved joint mobility, increased  ROM, and reduced pain. STM to L IS, IASTM with s/s/ tools UT,LS  07/27/23 THERAPEUTIC EXERCISE: to improve flexibility, strength and mobility.  Demonstration, verbal and tactile cues throughout for technique. Pulleys - Flexion and scaption x 3'  each L shoulder wand AAROM - drawing up back and across back x 10 each L shoulder IR towel stretch x 10 Standing RTB scap retraction + B shoulder row 10 x 3" Standing RTB scap retraction + B shoulder extension 10 x 3" - cues to avoid shoulder shrug Seated BATCA low row 10# x 10 Standing BATCA cable row 5# x 10 Standing BATCA cable shoulder extension 5# x 10 Standing BATCA lat pull down 10# x 10 Seated BATCA lat pull down 10# x 10  THERAPEUTIC ACTIVITIES: Shoulder MMT  MODALITIES: Iontophoresis with 1.0 mL 4mg /ml Dexamethasone - 4-6 hour patch (76mA-min) to L anterior shoulder (patch #3 of 6)   07/20/23 - PN THERAPEUTIC EXERCISE: to improve flexibility, strength and mobility.  Demonstration, verbal and tactile cues throughout for technique. UBE - L2.0 x 6 min (3' each fwd & back) Supine L shoulder flexion focusing on dropping humeral head with OH elevation x 10 Supine L shoulder protraction/retraction at 90 flexion2# x 10 Supine L shoulder CW/CCW circles at 90 flexion 2# x 10 each  Supine L shoulder rhythmic stabilization at 90 flexion 2 x 30"  THERAPEUTIC ACTIVITIES: L UE ROM Goal assessment  MANUAL THERAPY: To promote normalized muscle tension, improved flexibility, improved joint mobility, increased ROM, and reduced pain. Skilled palpation and monitoring of soft tissue during DN Trigger Point Dry-Needling  Treatment instructions: Expect mild to moderate muscle soreness. Patient verbalized understanding of these instructions and education. Patient Consent Given: Yes Education handout provided: Yes Muscles treated: L anterolateral deltoid Electrical stimulation performed: No Parameters: N/A Treatment response/outcome: Twitch Response Elicited and Palpable Increase in Muscle Length STM/DTM, manual TPR and pin & stretch to L anterior, lateral and posterior deltoid, triceps, teres group and lats  MODALITIES: Iontophoresis with 1.0 mL 4mg /ml Dexamethasone - 4-6 hour patch  (65mA-min) to L anterolateral shoulder (patch #2 of 6)   07/17/23 THERAPEUTIC EXERCISE: to improve flexibility, strength and mobility.  Demonstration, verbal and tactile cues throughout for technique. UBE - L1.5 x 6 min (3' each fwd & back) S/L L shoulder horizontal ABD x 10 S/L L shoulder abduction 2 x 10 - facilitation for scapular motion with cues to depress shoulders S/L L shoulder ER 1# 2x10- towel under elbow S/L L shoulder flexion x 10 Seated scapular retraction and ER YTB x 10  Wall wash 3 way x 10  MANUAL THERAPY: To promote normalized muscle tension, improved flexibility, improved joint mobility, increased ROM, and reduced pain.  L GH joint inferior glides for abduction PROM into abduction and flexion   PATIENT EDUCATION:  Education details: continue with current HEP and Ionto patch wearing instructions  Person educated: Patient Education method: Explanation Education comprehension: verbalized understanding  HOME EXERCISE PROGRAM: Access Code: [email or web address] URL: [email or web address] Date: 07/10/2023 Prepared by: Verta Ellen  Exercises - Doorway Pec Stretch at 60 Degrees Abduction with Arm Straight (Mirrored)  - 2 x daily - 7 x weekly - 3 reps - 30 sec hold - Single Arm Doorway Pec Stretch at 90 Degrees Abduction (Mirrored)  - 2 x daily - 7 x weekly - 3 reps - 30 sec hold - Single Arm Doorway Pec Stretch at 120 Degrees Abduction (Mirrored)  - 2 x daily - 7 x weekly - 3 reps - 30  sec hold - Seated Scapular Retraction  - 2 x daily - 7 x weekly - 2 sets - 10 reps - 5 sec hold - Shoulder External Rotation and Scapular Retraction  - 2 x daily - 7 x weekly - 2 sets - 10 reps - 3 sec hold - Sidelying Shoulder Abduction Palm Forward  - 1 x daily - 7 x weekly - 2 sets - 10 reps - Sidelying Shoulder Flexion 15 Degrees  - 1 x daily - 7 x weekly - 2 sets - 10 reps - Sidelying Shoulder Horizontal Abduction  - 1 x daily - 7 x weekly - 2 sets - 10  reps   ASSESSMENT:  CLINICAL IMPRESSION: Pt reported being sore from gym workout yesterday so we toned down the exercises . today. Good tolerance for the exercises today. Pt continuing to show that she fatigues quickly in her L shoulder. Pt very TTP in the L UT with muscle tension noted. Pt notes improvement from ionto patch.   Jodye is demonstrating progress towards her PT goals and will benefit from skilled PT to address ongoing muscle tension, range of motion and strength deficits to improve mobility and activity tolerance with decreased pain interference.   OBJECTIVE IMPAIRMENTS: decreased activity tolerance, decreased knowledge of condition, decreased ROM, decreased strength, hypomobility, increased fascial restrictions, impaired perceived functional ability, increased muscle spasms, impaired flexibility, impaired UE functional use, improper body mechanics, postural dysfunction, and pain.   ACTIVITY LIMITATIONS: lifting, sleeping, bathing, dressing, reach over head, and hygiene/grooming  PARTICIPATION LIMITATIONS: meal prep, cleaning, laundry, community activity, and occupation  PERSONAL FACTORS: Past/current experiences, Time since onset of injury/illness/exacerbation, and 3+ comorbidities: Osteopenia, GERD, anxiety, depression, insomnia  are also affecting patient's functional outcome.   REHAB POTENTIAL: Good  CLINICAL DECISION MAKING: Stable/uncomplicated  EVALUATION COMPLEXITY: Low   GOALS: Goals reviewed with patient? Yes  SHORT TERM GOALS: Target date: 07/20/2023  Patient will be independent with initial HEP to improve outcomes and carryover.  Baseline:  Goal status: MET - 07/20/23   2.  Patient will report 25% reduction in L shoulder pain. Baseline: Up to 4/10 with certain movements Goal status: IN PROGRESS - 07/20/23 - Pt reports 10% improvement  3.  Patient will demonstrate improved scapulohumeral kinematics for increased L shoulder ROM. Baseline:  Goal status: IN  PROGRESS - 07/13/23 - facilitation necessary for coordinated scapular motion  LONG TERM GOALS: Target date: 08/17/2023  Patient will be independent with ongoing/advanced HEP for self-management at home.  Baseline:  Goal status: IN PROGRESS  2.  Patient will report 50-75% improvement in L shoulder pain to improve QOL.  Baseline: Up to 4/10 with certain movements Goal status: IN PROGRESS - 07/20/23 - Pt reports 10% improvement  3.  Patient to demonstrate improved upright posture with posterior shoulder girdle engaged to promote improved glenohumeral joint mobility. Baseline: fwd head, rounded shoulders, and L shoulder mildly depressed and protracted Goal status: IN PROGRESS   4.  Patient to improve L shoulder AROM to WFL/WNL without pain provocation to allow for increased ease of ADLs.  Baseline: Refer to above UE ROM table Goal status: IN PROGRESS - 07/20/23 - significant gains noted in L shoulder abduction, however remaining motions essentially unchanged  5.  Patient will demonstrate improved L shoulder strength to >/= 4+/5 for functional UE use. Baseline: Refer to above UE MMT table Goal status: IN PROGRESS - 07/27/23 - strength improving with less pain reported with resisted movement  6  Patient will report </= 8%  on QuickDASH to demonstrate improved functional ability.  Baseline: 18.2 / 100 = 18.2 % Goal status: IN PROGRESS  7.  Patient will resume working out at normal frequency without limitation due to L shoulder pain or LOM. Baseline: Patient has cut back on workouts to 1 time a week due to L shoulder pain Goal status: IN PROGRESS - 07/27/23 - discussed gradual reincorporation of shoulders machines to gym workouts   PLAN:  PT FREQUENCY: 2x/week  PT DURATION: 8 weeks  PLANNED INTERVENTIONS: Therapeutic exercises, Therapeutic activity, Neuromuscular re-education, Patient/Family education, Self Care, Joint mobilization, Dry Needling, Electrical stimulation, Spinal  mobilization, Cryotherapy, Moist heat, Splintting, Taping, Vasopneumatic device, Ultrasound, Ionotophoresis 4mg /ml Dexamethasone, Manual therapy, and Re-evaluation  PLAN FOR NEXT SESSION:  assess response to Iontopatch;progress postural strengthening and L shoulder isometrics/resisted strengthening; progress gentle L shoulder AA/AROM;   MT +/- DN and/or modalities including ionto #3 as benefit noted for pain management PRN  Darleene Cleaver, PTA 07/31/2023, 2:02 PM  [facility] [address]  [address] [place], [place], [address] Phone: [phone]   Fax:  [phone]

## 2023-08-03 ENCOUNTER — Ambulatory Visit: Payer: 59 | Admitting: Physical Therapy

## 2023-08-03 ENCOUNTER — Encounter: Payer: Self-pay | Admitting: Physical Therapy

## 2023-08-03 DIAGNOSIS — M25512 Pain in left shoulder: Secondary | ICD-10-CM

## 2023-08-03 DIAGNOSIS — R293 Abnormal posture: Secondary | ICD-10-CM

## 2023-08-03 DIAGNOSIS — M6281 Muscle weakness (generalized): Secondary | ICD-10-CM

## 2023-08-03 DIAGNOSIS — M25612 Stiffness of left shoulder, not elsewhere classified: Secondary | ICD-10-CM

## 2023-08-03 NOTE — Therapy (Signed)
OUTPATIENT PHYSICAL THERAPY TREATMENT    Patient Name: Courtney Calhoun MRN: [id number] DOB:1961-01-02, 62 y.o., female Today's Date: 08/03/2023   END OF SESSION:  PT End of Session - 08/03/23 1358     Visit Number 8    Date for PT Re-Evaluation 08/17/23    Authorization Type [employer]    Progress Note Due on Visit 15   MD PN on visit #5 - 07/20/23   PT Start Time 1358    PT Stop Time 1446    PT Time Calculation (min) 48 min    Activity Tolerance Patient tolerated treatment well    Behavior During Therapy [facility] for tasks assessed/performed                   Past Medical History:  Diagnosis Date   Allergy    Anemia    Anxiety    Depression    Gilbert's syndrome    Glaucoma    Hematuria    Hyperlipidemia    Osteopenia    Past Surgical History:  Procedure Laterality Date   CHOLECYSTECTOMY     EYE SURGERY     INNER EAR SURGERY     WISDOM TOOTH EXTRACTION     Patient Active Problem List   Diagnosis Date Noted   ETD (Eustachian tube dysfunction), right 07/04/2020   Overweight (BMI 25.0-29.9) 02/24/2020   Pain in joint of right hip 10/30/2019   Lower limb pain, inferior, right 10/30/2019   Insomnia 08/26/2019   GERD (gastroesophageal reflux disease) 08/26/2019   Palpitations 08/26/2019   Unilateral osteoarthritis of first carpometacarpal (CMC) joint 10/15/2015   General medical examination 07/13/2011   Hyperlipidemia 11/08/2010   Allergic rhinitis 11/08/2010   Osteopenia 11/08/2010    PCP: Sheliah Hatch, MD   REFERRING PROVIDER: Rodolph Bong, MD   REFERRING DIAG: [phone] (ICD-10-CM) - Left shoulder pain, unspecified chronicity   THERAPY DIAG:  No diagnosis found.  RATIONALE FOR EVALUATION AND TREATMENT: Rehabilitation  ONSET DATE: May 2024  NEXT MD VISIT: 08/21/2023   SUBJECTIVE:                                                                                                                                                                                                          SUBJECTIVE STATEMENT: Pt reports since she has been working on [employer], she has noted increased soreness.  PAIN: Are you having pain? Yes: NPRS scale:  5/10 Pain location: L outer shoulder and upper arm  Pain description: sore Aggravating factors: raising arm in abduction, fastening her  bra, lifting something with L hand, sleeping on L side Relieving factors: stay away from triggering activity, ice, pain patches  PERTINENT HISTORY:  Osteopenia, GERD, anxiety, depression, insomnia  PRECAUTIONS: None  RED FLAGS: None  HAND DOMINANCE: Right  WEIGHT BEARING RESTRICTIONS: No  FALLS:  Has patient fallen in last 6 months? Yes. 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 No acute fractures.   06/14/23 - Diagnostic Limited MSK Ultrasound of: Left shoulder Mild subacromial bursitis and AC DJD with effusion are present.  Otherwise the shoulder examination is normal on ultrasound. Impression: Subacromial bursitis  PATIENT SURVEYS:  Quick Dash 18.2 / 100 = 18.2 %  COGNITION: Overall cognitive status: Within functional limits for tasks assessed     SENSATION: WFL  POSTURE: rounded shoulders, forward head, and L shoulder mildly depressed and protracted  UPPER EXTREMITY ROM:   Active ROM Right eval Left eval L 07/20/23  Shoulder flexion 161 144 - tight 142 -  tight  Shoulder extension 66 38 ^ 40 ^  Shoulder abduction 160 87 ^ 124 ^  Shoulder adduction     Shoulder internal rotation FIR WNL FIR sacrum ^ FIR sacrum ^  Shoulder external rotation FER T4 FER T4 FER T4  Elbow flexion     Elbow extension     Wrist flexion     Wrist extension     Wrist ulnar deviation     Wrist radial deviation     Wrist pronation     Wrist supination     (Blank rows = not tested, ^ = increased pain)  UPPER EXTREMITY MMT:  MMT Right eval Left eval R 07/27/23 L 07/27/23  Shoulder flexion 5 4+ ^ 5 4+  Shoulder extension 4 4 ^ 4+ 4  Shoulder abduction 5 3- ^ 4+ 3+ ^  Shoulder adduction      Shoulder internal rotation 5  4- ^ 5 4 ^  Shoulder external rotation 4+ 4 ^ 4+ 4+  Middle trapezius      Lower trapezius      Elbow flexion      Elbow extension      Wrist flexion      Wrist extension      Wrist ulnar deviation      Wrist radial deviation      Wrist pronation      Wrist supination      Grip strength (lbs)      (Blank rows = not tested, ^ = increased pain)  SHOULDER SPECIAL TESTS: Impingement tests: Neer impingement test: positive , Hawkins/Kennedy impingement test: positive , and Painful arc test: positive  Rotator cuff assessment: Empty can test: negative and Full can test: negative  JOINT MOBILITY TESTING:  Mildly restricted  PALPATION:  TTP in L anterolateral deltoid and pecs    TODAY'S TREATMENT:   08/03/23 THERAPEUTIC EXERCISE: to improve flexibility, strength and mobility.  Demonstration, verbal and tactile cues throughout for technique. UBE -  L2.0 x 6 min (3' each fwd & back) L shoulder S/L sleeper IR stretch 10 x 5" Standing YTB scap retraction + B shoulder ER 10 x 3" Standing RTB scap retraction + B shoulder row 10 x 3" - cues to avoid shoulder shrug Standing RTB scap retraction + B shoulder extension 10 x 3"  Lower trap setting at wall ("V" wall slide with slight lift off) 10 x 3" Serratus push-up on wall x 10 Serratus wall  clocks with looped YTB at wrists x 10 bil  MODALITIES: Iontophoresis with 1.0 mL 4mg /ml Dexamethasone - 4-6 hour patch (9mA-min) to L anterior shoulder (patch #4 of 6)   07/31/23 THERAPEUTIC EXERCISE: to improve flexibility, strength and mobility.  Demonstration, verbal and tactile cues throughout for technique. UBE - L2.0 x 6 min (3' each fwd & back) Supine L shoulder flexion 2x10 AROM Supine L shoulder CW/CCW circles 2x10 both ways 2# Supine L scap protraction 2x10 2# S/L L shoulder flexion 2x10 S/L L shoulder ER x 10 2# Seated L shoulder row and ext bent over 2# x 10  L shoulder IR behind back with strap x 10   MANUAL THERAPY: To promote normalized muscle tension, improved flexibility, improved joint mobility, increased ROM, and reduced pain. STM to L IS, IASTM with s/s/ tools UT,LS   07/27/23 THERAPEUTIC EXERCISE: to improve flexibility, strength and mobility.  Demonstration, verbal and tactile cues throughout for technique. Pulleys - Flexion and scaption x 3' each L shoulder wand AAROM - drawing up back and across back x 10 each L shoulder IR towel stretch x 10 Standing RTB scap retraction + B shoulder row 10 x 3" Standing RTB scap retraction + B shoulder extension 10 x 3" - cues to avoid shoulder shrug Seated BATCA low row 10# x 10 Standing BATCA cable row 5# x 10 Standing BATCA cable shoulder extension 5# x 10 Standing BATCA lat pull down 10# x 10 Seated BATCA lat pull down 10# x 10  THERAPEUTIC ACTIVITIES: Shoulder MMT  MODALITIES: Iontophoresis with 1.0 mL 4mg /ml Dexamethasone - 4-6 hour patch (35mA-min) to L anterior shoulder (patch #3 of 6)   PATIENT EDUCATION:  Education details: HEP progression - scapular strengthening, HEP modification - focus on only 1 version of shoulder IR stretch, and Ionto patch wearing instructions  Person educated: Patient Education method: Explanation, Demonstration, Verbal cues, and MedBridgeGO app updated Education comprehension:  verbalized understanding, returned demonstration, verbal cues required, and needs further education  HOME EXERCISE PROGRAM: *Access Code: [email or web address] URL: [email or web address] Date: 08/03/2023 Prepared by: Glenetta Hew  Exercises - Doorway Pec Stretch at 60 Degrees Abduction with Arm Straight (Mirrored)  - 2 x daily - 7 x weekly - 3 reps - 30 sec hold - Single Arm Doorway Pec Stretch at 90 Degrees Abduction (Mirrored)  - 2 x daily - 7 x weekly - 3 reps - 30 sec hold - Single Arm Doorway Pec Stretch at 120 Degrees Abduction (Mirrored)  - 2 x daily - 7 x weekly - 3 reps - 30 sec hold - Seated Scapular Retraction  - 2 x daily - 7 x weekly - 2 sets - 10 reps - 5 sec hold - Sidelying Shoulder Abduction Palm Forward  - 1 x daily - 7 x weekly - 2 sets - 10 reps - Sidelying Shoulder Flexion 15 Degrees  - 1 x daily - 7 x weekly - 2 sets - 10 reps - Sidelying Shoulder Horizontal Abduction  - 1 x daily - 7 x  weekly - 2 sets - 10 reps - Standing Shoulder Internal Rotation Stretch with Towel  - 1 x daily - 7 x weekly - 2 sets - 10 reps - 3-5 sec hold - Standing Bilateral Shoulder Internal Rotation AAROM with Dowel  - 1 x daily - 7 x weekly - 2 sets - 10 reps - 3-5 sec hold - Sleeper Stretch  - 1 x daily - 7 x weekly - 2 sets - 10 reps - 3-5 sec hold - Shoulder External Rotation and Scapular Retraction with Resistance  - 1 x daily - 7 x weekly - 2 sets - 10 reps - 3-5 sec hold - Scapular Retraction with Resistance Advanced  - 1 x daily - 7 x weekly - 2 sets - 10 reps - 5 sec hold - Low Trap Setting at Wall  - 1 x daily - 7 x weekly - 2 sets - 10 reps - 3 sec hold  *Pt using MedBridgeGO app  ASSESSMENT:  CLINICAL IMPRESSION: Issis reports increased pain since adding the shoulder IR stretches, therefore reviewed these and clarified avoiding forcing ROM into painful range, as well as provided instruction in sleeper stretch as an alternative - pt instructed to choose just 1 stretch that allows  for best feeling of stretch w/o increased pain.  Progressed scapular strengthening as patient continues to demonstrate impaired scapulohumeral kinematics with significant asymmetry still present.  HEP updated to reflect exercise progression, however cautioned patient to avoid pushing through exercises that result in residual increased pain upon completion of exercises.  Mykayla will benefit from continued skilled PT to address ongoing abnormal muscle tension/activation, range of motion and strength deficits to improve mobility and activity tolerance with decreased pain interference.   OBJECTIVE IMPAIRMENTS: decreased activity tolerance, decreased knowledge of condition, decreased ROM, decreased strength, hypomobility, increased fascial restrictions, impaired perceived functional ability, increased muscle spasms, impaired flexibility, impaired UE functional use, improper body mechanics, postural dysfunction, and pain.   ACTIVITY LIMITATIONS: lifting, sleeping, bathing, dressing, reach over head, and hygiene/grooming  PARTICIPATION LIMITATIONS: meal prep, cleaning, laundry, community activity, and occupation  PERSONAL FACTORS: Past/current experiences, Time since onset of injury/illness/exacerbation, and 3+ comorbidities: Osteopenia, GERD, anxiety, depression, insomnia  are also affecting patient's functional outcome.   REHAB POTENTIAL: Good  CLINICAL DECISION MAKING: Stable/uncomplicated  EVALUATION COMPLEXITY: Low   GOALS: Goals reviewed with patient? Yes  SHORT TERM GOALS: Target date: 07/20/2023  Patient will be independent with initial HEP to improve outcomes and carryover.  Baseline:  Goal status: MET - 07/20/23   2.  Patient will report 25% reduction in L shoulder pain. Baseline: Up to 4/10 with certain movements Goal status: IN PROGRESS - 07/20/23 - Pt reports 10% improvement  3.  Patient will demonstrate improved scapulohumeral kinematics for increased L shoulder ROM. Baseline:   Goal status: IN PROGRESS - 08/03/23 - facilitation necessary for coordinated scapular motion with significant asymmetry still present  LONG TERM GOALS: Target date: 08/17/2023  Patient will be independent with ongoing/advanced HEP for self-management at home.  Baseline:  Goal status: IN PROGRESS  2.  Patient will report 50-75% improvement in L shoulder pain to improve QOL.  Baseline: Up to 4/10 with certain movements Goal status: IN PROGRESS - 07/20/23 - Pt reports 10% improvement  3.  Patient to demonstrate improved upright posture with posterior shoulder girdle engaged to promote improved glenohumeral joint mobility. Baseline: fwd head, rounded shoulders, and L shoulder mildly depressed and protracted Goal status: IN PROGRESS   4.  Patient  to improve L shoulder AROM to WFL/WNL without pain provocation to allow for increased ease of ADLs.  Baseline: Refer to above UE ROM table Goal status: IN PROGRESS - 07/20/23 - significant gains noted in L shoulder abduction, however remaining motions essentially unchanged  5.  Patient will demonstrate improved L shoulder strength to >/= 4+/5 for functional UE use. Baseline: Refer to above UE MMT table Goal status: IN PROGRESS - 07/27/23 - strength improving with less pain reported with resisted movement  6  Patient will report </= 8% on QuickDASH to demonstrate improved functional ability.  Baseline: 18.2 / 100 = 18.2 % Goal status: IN PROGRESS  7.  Patient will resume working out at normal frequency without limitation due to L shoulder pain or LOM. Baseline: Patient has cut back on workouts to 1 time a week due to L shoulder pain Goal status: IN PROGRESS - 07/27/23 - discussed gradual reincorporation of shoulders machines to gym workouts   PLAN:  PT FREQUENCY: 2x/week  PT DURATION: 8 weeks  PLANNED INTERVENTIONS: Therapeutic exercises, Therapeutic activity, Neuromuscular re-education, Patient/Family education, Self Care, Joint  mobilization, Dry Needling, Electrical stimulation, Spinal mobilization, Cryotherapy, Moist heat, Splintting, Taping, Vasopneumatic device, Ultrasound, Ionotophoresis 4mg /ml Dexamethasone, Manual therapy, and Re-evaluation  PLAN FOR NEXT SESSION:  assess response to Iontopatch; progress postural/scapular strengthening and L shoulder isometrics/resisted strengthening; progress gentle L shoulder AA/AROM;   MT +/- DN and/or modalities including ionto #5 as benefit noted for pain management PRN  Marry Guan, PT 08/03/2023, 5:57 PM  [facility] [address]  [address] [place], [place], [address] Phone: [phone]   Fax:  [phone]

## 2023-08-14 ENCOUNTER — Encounter: Payer: Self-pay | Admitting: Physical Therapy

## 2023-08-14 ENCOUNTER — Ambulatory Visit: Payer: 59 | Attending: Family Medicine | Admitting: Physical Therapy

## 2023-08-14 DIAGNOSIS — R293 Abnormal posture: Secondary | ICD-10-CM | POA: Insufficient documentation

## 2023-08-14 DIAGNOSIS — M25512 Pain in left shoulder: Secondary | ICD-10-CM | POA: Insufficient documentation

## 2023-08-14 DIAGNOSIS — M6281 Muscle weakness (generalized): Secondary | ICD-10-CM | POA: Insufficient documentation

## 2023-08-14 DIAGNOSIS — M25612 Stiffness of left shoulder, not elsewhere classified: Secondary | ICD-10-CM | POA: Insufficient documentation

## 2023-08-14 NOTE — Therapy (Signed)
OUTPATIENT PHYSICAL THERAPY TREATMENT/RE-ASSESSMENT AND RECERT    Patient Name: Courtney Calhoun MRN: [id number] DOB:[birth year], 62 y.o., female Today's Date: 08/14/2023   END OF SESSION:  PT End of Session - 08/14/23 1530     Visit Number 9    Date for PT Re-Evaluation 09/11/23    Authorization Type [employer]    Progress Note Due on Visit 19   recert visit 9   PT Start Time 1509    PT Stop Time 1551    PT Time Calculation (min) 42 min    Activity Tolerance Patient tolerated treatment well    Behavior During Therapy WFL for tasks assessed/performed                    Past Medical History:  Diagnosis Date   Allergy    Anemia    Anxiety    Depression    Gilbert's syndrome    Glaucoma    Hematuria    Hyperlipidemia    Osteopenia    Past Surgical History:  Procedure Laterality Date   CHOLECYSTECTOMY     EYE SURGERY     INNER EAR SURGERY     WISDOM TOOTH EXTRACTION     Patient Active Problem List   Diagnosis Date Noted   ETD (Eustachian tube dysfunction), right 07/04/2020   Overweight (BMI 25.0-29.9) 02/24/2020   Pain in joint of right hip 10/30/2019   Lower limb pain, inferior, right 10/30/2019   Insomnia 08/26/2019   GERD (gastroesophageal reflux disease) 08/26/2019   Palpitations 08/26/2019   Unilateral osteoarthritis of first carpometacarpal (CMC) joint 10/15/2015   General medical examination 07/13/2011   Hyperlipidemia 11/08/2010   Allergic rhinitis 11/08/2010   Osteopenia 11/08/2010    PCP: Sheliah Hatch, MD   REFERRING PROVIDER: Rodolph Bong, MD   REFERRING DIAG: [phone] (ICD-10-CM) - Left shoulder pain, unspecified chronicity   THERAPY DIAG:  Acute pain of left shoulder  Stiffness of left shoulder, not elsewhere classified  Abnormal posture  Muscle weakness (generalized)  RATIONALE FOR EVALUATION AND TREATMENT: Rehabilitation  ONSET DATE: May 2024  NEXT MD VISIT: 08/21/2023   SUBJECTIVE:                                                                                                                                                                                                          SUBJECTIVE STATEMENT:  Shoulder is feeling a bit better, the exercises where I'm trying to get more movement reaching behind my back is helping more now. I'd say I'm 4/10, biggest concern  is IR ROM especially. Getting dressed like putting on a jacket and doing a bra are hard, everything in front of me is fine. Rotation is the biggest issue. Nothing new since last visit. Trying to use arm and not shy away from it.   PAIN: Are you having pain? Yes: NPRS scale: 2-3/10 Pain location: L arm when going into IR   Pain description: sore and tight  Aggravating factors: reaching into IR  Relieving factors: exercises, using arm   PERTINENT HISTORY:  Osteopenia, GERD, anxiety, depression, insomnia  PRECAUTIONS: None  RED FLAGS: None  HAND DOMINANCE: Right  WEIGHT BEARING RESTRICTIONS: No  FALLS:  Has patient fallen in last 6 months? Yes. 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 No acute fractures.   06/14/23 - Diagnostic Limited MSK Ultrasound of: Left shoulder Mild subacromial bursitis and AC DJD with effusion are present.  Otherwise the shoulder examination is normal on ultrasound. Impression: Subacromial bursitis  PATIENT SURVEYS:  Quick  Dash 18.2 / 100 = 18.2 %; 08/14/23 36.4%    COGNITION: Overall cognitive status: Within functional limits for tasks assessed     SENSATION: WFL  POSTURE: rounded shoulders, forward head, and L shoulder mildly depressed and protracted  UPPER EXTREMITY ROM:   Active ROM Right eval Left eval L 07/20/23 L 08/14/23  Shoulder flexion 161 144 - tight 142 - tight 140*  Shoulder extension 66 38 ^ 40 ^   Shoulder abduction 160 87 ^ 124 ^ 108* before trunk compensations   Shoulder adduction      Shoulder internal rotation FIR WNL FIR sacrum ^ FIR sacrum ^ FIR L5   Shoulder external rotation FER T4 FER T4 FER T4 FER T4  Elbow flexion      Elbow extension      Wrist flexion      Wrist extension      Wrist ulnar deviation      Wrist radial deviation      Wrist pronation      Wrist supination      (Blank rows = not tested, ^ = increased pain)  UPPER EXTREMITY MMT:  MMT Right eval Left eval R 07/27/23 L 07/27/23 R 08/14/23 L 08/14/23  Shoulder flexion 5 4+ ^ 5 4+ 4+ 4+  Shoulder extension 4 4 ^ 4+ 4    Shoulder abduction 5 3- ^ 4+ 3+ ^ 4+ 3 pain limited   Shoulder adduction        Shoulder internal rotation 5  4- ^ 5 4 ^ 4+ 4+  Shoulder external rotation 4+ 4 ^ 4+ 4+ 4+ 4+  Middle trapezius        Lower trapezius        Elbow flexion        Elbow extension        Wrist flexion        Wrist extension        Wrist ulnar deviation  Wrist radial deviation        Wrist pronation        Wrist supination        Grip strength (lbs)        (Blank rows = not tested, ^ = increased pain)  SHOULDER SPECIAL TESTS: Impingement tests: Neer impingement test: positive , Hawkins/Kennedy impingement test: positive , and Painful arc test: positive  Rotator cuff assessment: Empty can test: negative and Full can test: negative  JOINT MOBILITY TESTING:  Mildly restricted  PALPATION:  TTP in L anterolateral deltoid and pecs    TODAY'S TREATMENT:    08/14/23  QuickDash, objective  measures, education on progress/goals/POC- ongoing PT may be beneficial in combo with new cortisone shot next week    TherEx  UBE L2 x6 minutes (3 min forward/3 min back)     Manual  PROM/stretching + grade III GH mobs  and overpressure from PT all directions to tolerance       08/03/23 THERAPEUTIC EXERCISE: to improve flexibility, strength and mobility.  Demonstration, verbal and tactile cues throughout for technique. UBE - L2.0 x 6 min (3' each fwd & back) L shoulder S/L sleeper IR stretch 10 x 5" Standing YTB scap retraction + B shoulder ER 10 x 3" Standing RTB scap retraction + B shoulder row 10 x 3" - cues to avoid shoulder shrug Standing RTB scap retraction + B shoulder extension 10 x 3"  Lower trap setting at wall ("V" wall slide with slight lift off) 10 x 3" Serratus push-up on wall x 10 Serratus wall clocks with looped YTB at wrists x 10 bil  MODALITIES: Iontophoresis with 1.0 mL 4mg /ml Dexamethasone - 4-6 hour patch (45mA-min) to L anterior shoulder (patch #4 of 6)   07/31/23 THERAPEUTIC EXERCISE: to improve flexibility, strength and mobility.  Demonstration, verbal and tactile cues throughout for technique. UBE - L2.0 x 6 min (3' each fwd & back) Supine L shoulder flexion 2x10 AROM Supine L shoulder CW/CCW circles 2x10 both ways 2# Supine L scap protraction 2x10 2# S/L L shoulder flexion 2x10 S/L L shoulder ER x 10 2# Seated L shoulder row and ext bent over 2# x 10  L shoulder IR behind back with strap x 10   MANUAL THERAPY: To promote normalized muscle tension, improved flexibility, improved joint mobility, increased ROM, and reduced pain. STM to L IS, IASTM with s/s/ tools UT,LS   07/27/23 THERAPEUTIC EXERCISE: to improve flexibility, strength and mobility.  Demonstration, verbal and tactile cues throughout for technique. Pulleys - Flexion and scaption x 3' each L shoulder wand AAROM - drawing up back and across back x 10 each L shoulder IR towel  stretch x 10 Standing RTB scap retraction + B shoulder row 10 x 3" Standing RTB scap retraction + B shoulder extension 10 x 3" - cues to avoid shoulder shrug Seated BATCA low row 10# x 10 Standing BATCA cable row 5# x 10 Standing BATCA cable shoulder extension 5# x 10 Standing BATCA lat pull down 10# x 10 Seated BATCA lat pull down 10# x 10  THERAPEUTIC ACTIVITIES: Shoulder MMT  MODALITIES: Iontophoresis with 1.0 mL 4mg /ml Dexamethasone - 4-6 hour patch (83mA-min) to L anterior shoulder (patch #3 of 6)   PATIENT EDUCATION:  Education details: HEP progression - scapular strengthening, HEP modification - focus on only 1 version of shoulder IR stretch, and Ionto patch wearing instructions  Person educated: Patient Education method: Explanation, Demonstration, Verbal cues, and MedBridgeGO app updated Education comprehension:  verbalized understanding, returned demonstration, verbal cues required, and needs further education  HOME EXERCISE PROGRAM: *Access Code: [email or web address] URL: [email or web address] Date: 08/03/2023 Prepared by: Glenetta Hew  Exercises - Doorway Pec Stretch at 60 Degrees Abduction with Arm Straight (Mirrored)  - 2 x daily - 7 x weekly - 3 reps - 30 sec hold - Single Arm Doorway Pec Stretch at 90 Degrees Abduction (Mirrored)  - 2 x daily - 7 x weekly - 3 reps - 30 sec hold - Single Arm Doorway Pec Stretch at 120 Degrees Abduction (Mirrored)  - 2 x daily - 7 x weekly - 3 reps - 30 sec hold - Seated Scapular Retraction  - 2 x daily - 7 x weekly - 2 sets - 10 reps - 5 sec hold - Sidelying Shoulder Abduction Palm Forward  - 1 x daily - 7 x weekly - 2 sets - 10 reps - Sidelying Shoulder Flexion 15 Degrees  - 1 x daily - 7 x weekly - 2 sets - 10 reps - Sidelying Shoulder Horizontal Abduction  - 1 x daily - 7 x weekly - 2 sets - 10 reps - Standing Shoulder Internal Rotation Stretch with Towel  - 1 x daily - 7 x weekly - 2 sets - 10 reps - 3-5 sec hold - Standing  Bilateral Shoulder Internal Rotation AAROM with Dowel  - 1 x daily - 7 x weekly - 2 sets - 10 reps - 3-5 sec hold - Sleeper Stretch  - 1 x daily - 7 x weekly - 2 sets - 10 reps - 3-5 sec hold - Shoulder External Rotation and Scapular Retraction with Resistance  - 1 x daily - 7 x weekly - 2 sets - 10 reps - 3-5 sec hold - Scapular Retraction with Resistance Advanced  - 1 x daily - 7 x weekly - 2 sets - 10 reps - 5 sec hold - Low Trap Setting at Wall  - 1 x daily - 7 x weekly - 2 sets - 10 reps - 3 sec hold  *Pt using MedBridgeGO app  ASSESSMENT:  CLINICAL IMPRESSION:  Pt arrives today doing OK, we focused on getting objective measures for re-cert today. Strength is improving, ROM continues to be limited- she is getting cortisone shot this coming Monday, hopefully this will help her tolerate more aggressive PT for ROM afterwards. We will extend PT POC for an additional month at this time however if she shows limited progress at the end of that period, would benefit from return to MD for further w/u and care planning.   OBJECTIVE IMPAIRMENTS: decreased activity tolerance, decreased knowledge of condition, decreased ROM, decreased strength, hypomobility, increased fascial restrictions, impaired perceived functional ability, increased muscle spasms, impaired flexibility, impaired UE functional use, improper body mechanics, postural dysfunction, and pain.   ACTIVITY LIMITATIONS: lifting, sleeping, bathing, dressing, reach over head, and hygiene/grooming  PARTICIPATION LIMITATIONS: meal prep, cleaning, laundry, community activity, and occupation  PERSONAL FACTORS: Past/current experiences, Time since onset of injury/illness/exacerbation, and 3+ comorbidities: Osteopenia, GERD, anxiety, depression, insomnia  are also affecting patient's functional outcome.   REHAB POTENTIAL: Good  CLINICAL DECISION MAKING: Stable/uncomplicated  EVALUATION COMPLEXITY: Low   GOALS: Goals reviewed with patient?  Yes  SHORT TERM GOALS: Target date: 08/28/2023    Patient will be independent with initial HEP to improve outcomes and carryover.  Baseline:  Goal status: MET - 07/20/23   2.  Patient will report 25% reduction in L shoulder pain. Baseline: Up to 4/10 with certain  movements Goal status: IN PROGRESS - 08/14/23- some improvement depends on task at hand   3.  Patient will demonstrate improved scapulohumeral kinematics for increased L shoulder ROM. Baseline:  Goal status: IN PROGRESS - 08/14/23- ongoing winging L scapula  LONG TERM GOALS: Target date: 09/11/2023    Patient will be independent with ongoing/advanced HEP for self-management at home.  Baseline:  Goal status: IN PROGRESS 08/14/23  2.  Patient will report 50-75% improvement in L shoulder pain to improve QOL.  Baseline: Up to 4/10 with certain movements Goal status: IN PROGRESS - 08/14/23  3.  Patient to demonstrate improved upright posture with posterior shoulder girdle engaged to promote improved glenohumeral joint mobility. Baseline: fwd head, rounded shoulders, and L shoulder mildly depressed and protracted Goal status: IN PROGRESS  08/14/23  4.  Patient to improve L shoulder AROM to WFL/WNL without pain provocation to allow for increased ease of ADLs.  Baseline: Refer to above UE ROM table Goal status: IN PROGRESS -  08/14/23  5.  Patient will demonstrate improved L shoulder strength to >/= 4+/5 for functional UE use. Baseline: Refer to above UE MMT table Goal status: IN PROGRESS - 08/14/23  6  Patient will report </= 8% on QuickDASH to demonstrate improved functional ability.  Baseline: 18.2 / 100 = 18.2 % Goal status: IN PROGRESS 08/14/23  7.  Patient will resume working out at normal frequency without limitation due to L shoulder pain or LOM. Baseline: Patient has cut back on workouts to 1 time a week due to L shoulder pain Goal status: IN PROGRESS - 08/14/23- back to gym but not to normal frequency/intensity     PLAN:  PT FREQUENCY: 2x/week  PT DURATION: 4 weeks  PLANNED INTERVENTIONS: Therapeutic exercises, Therapeutic activity, Neuromuscular re-education, Patient/Family education, Self Care, Joint mobilization, Dry Needling, Electrical stimulation, Spinal mobilization, Cryotherapy, Moist heat, Splintting, Taping, Vasopneumatic device, Ultrasound, Ionotophoresis 4mg /ml Dexamethasone, Manual therapy, and Re-evaluation  PLAN FOR NEXT SESSION:  having cortisone shot Monday 08/21/23- no more ionto after this date. assess response to Iontopatch; progress postural/scapular strengthening and L shoulder isometrics/resisted strengthening; progress gentle L shoulder AA/AROM;   MT +/- DN and/or modalities including ionto #5 as benefit noted for pain management PRN   Nedra Hai, PT, DPT 08/14/23 3:54 PM   [facility] [address]  [address] [place], [place], [address] Phone: [phone]   Fax:  [phone]

## 2023-08-16 ENCOUNTER — Ambulatory Visit: Payer: 59

## 2023-08-21 ENCOUNTER — Other Ambulatory Visit: Payer: Self-pay

## 2023-08-21 ENCOUNTER — Ambulatory Visit: Payer: 59 | Admitting: Family Medicine

## 2023-08-21 VITALS — BP 104/70 | HR 56 | Ht 66.0 in | Wt 163.0 lb

## 2023-08-21 DIAGNOSIS — M25512 Pain in left shoulder: Secondary | ICD-10-CM

## 2023-08-21 DIAGNOSIS — G8929 Other chronic pain: Secondary | ICD-10-CM | POA: Diagnosis not present

## 2023-08-21 NOTE — Patient Instructions (Signed)
Thank you for coming in today.   Work on home exercises.   Recheck as needed.   Call or go to the ER if you develop a large red swollen joint with extreme pain or oozing puss.

## 2023-08-21 NOTE — Progress Notes (Signed)
   Rubin Payor, PhD, LAT, ATC acting as a scribe for Clementeen Graham, MD.  Courtney Calhoun is a 62 y.o. female who presents to [facility] today for f/u L shoulder pain. Pt was last seen by Dr. Denyse Amass on 07/24/23 and was advised to give PT another month, completing 9 total visits.  Today, pt reports she is doing better with the therapy, but still has issues with reaching behind her. Overall much better than she was.   Dx imaging: 06/14/23 L shoulder XR   Pertinent review of systems: No fevers or chills  Relevant historical information: Hyperlipidemia   Exam:  BP 104/70   Pulse (!) 56   Ht 5\' 6"  (1.676 m)   Wt 163 lb (73.9 kg)   SpO2 98%   BMI 26.31 kg/m  General: Well Developed, well nourished, and in no acute distress.   MSK: Left shoulder normal-appearing Range of motion lacks full functional internal rotation.    Lab and Radiology Results  Procedure: Real-time Ultrasound Guided Injection of left shoulder subacromial bursa Device: Philips Affiniti 50G/GE Logiq Images permanently stored and available for review in PACS Verbal informed consent obtained.  Discussed risks and benefits of procedure. Warned about infection, bleeding, hyperglycemia damage to structures among others. Patient expresses understanding and agreement Time-out conducted.   Noted no overlying erythema, induration, or other signs of local infection.   Skin prepped in a sterile fashion.   Local anesthesia: Topical Ethyl chloride.   With sterile technique and under real time ultrasound guidance: 40 mg of Kenalog and 2 mL of Marcaine injected into left shoulder subacromial bursa. Fluid seen entering the bursa.   Completed without difficulty   Pain immediately resolved suggesting accurate placement of the medication.   Advised to call if fevers/chills, erythema, induration, drainage, or persistent bleeding.   Images permanently stored and available for review in the  ultrasound unit.  Impression: Technically successful ultrasound guided injection.        Assessment and Plan: 62 y.o. female with chronic left shoulder pain.  Ultimately she is improved quite a bit with physical therapy but is still having some pain and lack of range of motion.  Plan for subacromial injection today.  This provided significant improvement in pain.  Plan to continue home exercises as taught by physical therapy and check back as needed.   PDMP not reviewed this encounter. Orders Placed This Encounter  Procedures   [place] LIMITED JOINT SPACE STRUCTURES UP LEFT(NO LINKED CHARGES)    Standing Status:   Future    Number of Occurrences:   1    Standing Expiration Date:   08/20/2024    Order Specific Question:   Reason for Exam (SYMPTOM  OR DIAGNOSIS REQUIRED)    Answer:   Left shoulder pain    Order Specific Question:   Preferred imaging location?    Answer:   [facility]   No orders of the defined types were placed in this encounter.    Discussed warning signs or symptoms. Please see discharge instructions. Patient expresses understanding.   The above documentation has been reviewed and is accurate and complete Clementeen Graham, M.D.

## 2023-08-29 ENCOUNTER — Ambulatory Visit: Payer: 59

## 2023-08-29 DIAGNOSIS — M6281 Muscle weakness (generalized): Secondary | ICD-10-CM

## 2023-08-29 DIAGNOSIS — M25612 Stiffness of left shoulder, not elsewhere classified: Secondary | ICD-10-CM

## 2023-08-29 DIAGNOSIS — M25512 Pain in left shoulder: Secondary | ICD-10-CM | POA: Diagnosis not present

## 2023-08-29 DIAGNOSIS — R293 Abnormal posture: Secondary | ICD-10-CM

## 2023-08-29 NOTE — Therapy (Signed)
OUTPATIENT PHYSICAL THERAPY TREATMENT/RE-ASSESSMENT AND RECERT    Patient Name: Courtney Calhoun MRN: [id number] DOB:Feb 03, 1961, 62 y.o., female Today's Date: 08/29/2023   END OF SESSION:  PT End of Session - 08/29/23 1444     Visit Number 10    Date for PT Re-Evaluation 09/11/23    Authorization Type [employer]    Progress Note Due on Visit 19   recert visit 9   PT Start Time 1404    PT Stop Time 1444    PT Time Calculation (min) 40 min    Activity Tolerance Patient tolerated treatment well    Behavior During Therapy WFL for tasks assessed/performed                     Past Medical History:  Diagnosis Date   Allergy    Anemia    Anxiety    Depression    Gilbert's syndrome    Glaucoma    Hematuria    Hyperlipidemia    Osteopenia    Past Surgical History:  Procedure Laterality Date   CHOLECYSTECTOMY     EYE SURGERY     INNER EAR SURGERY     WISDOM TOOTH EXTRACTION     Patient Active Problem List   Diagnosis Date Noted   ETD (Eustachian tube dysfunction), right 07/04/2020   Overweight (BMI 25.0-29.9) 02/24/2020   Pain in joint of right hip 10/30/2019   Lower limb pain, inferior, right 10/30/2019   Insomnia 08/26/2019   GERD (gastroesophageal reflux disease) 08/26/2019   Palpitations 08/26/2019   Unilateral osteoarthritis of first carpometacarpal (CMC) joint 10/15/2015   General medical examination 07/13/2011   Hyperlipidemia 11/08/2010   Allergic rhinitis 11/08/2010   Osteopenia 11/08/2010    PCP: Sheliah Hatch, MD   REFERRING PROVIDER: Rodolph Bong, MD   REFERRING DIAG: [phone] (ICD-10-CM) - Left shoulder pain, unspecified chronicity   THERAPY DIAG:  Acute pain of left shoulder  Stiffness of left shoulder, not elsewhere classified  Abnormal posture  Muscle weakness (generalized)  RATIONALE FOR EVALUATION AND TREATMENT: Rehabilitation  ONSET DATE: May 2024  NEXT MD VISIT: 08/21/2023   SUBJECTIVE:                                                                                                                                                                                                          SUBJECTIVE STATEMENT: Doing good, no pain, went to the gym to work her arms on Saturday.   PAIN: Are you having pain? Yes: NPRS scale: 0/10 Pain location: L arm  when going into IR   Pain description: sore and tight  Aggravating factors: reaching into IR  Relieving factors: exercises, using arm   PERTINENT HISTORY:  Osteopenia, GERD, anxiety, depression, insomnia  PRECAUTIONS: None  RED FLAGS: None  HAND DOMINANCE: Right  WEIGHT BEARING RESTRICTIONS: No  FALLS:  Has patient fallen in last 6 months? Yes. 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 No acute fractures.   06/14/23 - Diagnostic Limited MSK Ultrasound of: Left shoulder Mild subacromial bursitis and AC DJD with effusion are present.  Otherwise the shoulder examination is normal on ultrasound. Impression: Subacromial bursitis  PATIENT SURVEYS:  Quick Dash 18.2 / 100 = 18.2 %; 08/14/23 36.4%    COGNITION: Overall cognitive status: Within functional limits for tasks assessed     SENSATION: WFL  POSTURE: rounded shoulders, forward head, and L shoulder mildly depressed and protracted  UPPER EXTREMITY ROM:   Active ROM Right eval Left eval L 07/20/23 L 08/14/23   Shoulder flexion 161 144 - tight 142 - tight 140*  Shoulder extension 66 38 ^ 40 ^   Shoulder abduction 160 87 ^ 124 ^ 108* before trunk compensations   Shoulder adduction      Shoulder internal rotation FIR WNL FIR sacrum ^ FIR sacrum ^ FIR L5   Shoulder external rotation FER T4 FER T4 FER T4 FER T4  Elbow flexion      Elbow extension      Wrist flexion      Wrist extension      Wrist ulnar deviation      Wrist radial deviation      Wrist pronation      Wrist supination      (Blank rows = not tested, ^ = increased pain)  UPPER EXTREMITY MMT:  MMT Right eval Left eval R 07/27/23 L 07/27/23 R 08/14/23 L 08/14/23  Shoulder flexion 5 4+ ^ 5 4+ 4+ 4+  Shoulder extension 4 4 ^ 4+ 4    Shoulder abduction 5 3- ^ 4+ 3+ ^ 4+ 3 pain limited   Shoulder adduction        Shoulder internal rotation 5  4- ^ 5 4 ^ 4+ 4+  Shoulder external rotation 4+ 4 ^ 4+ 4+ 4+ 4+  Middle trapezius        Lower trapezius        Elbow flexion        Elbow extension        Wrist flexion        Wrist extension        Wrist ulnar deviation        Wrist radial deviation        Wrist pronation        Wrist supination        Grip strength (lbs)        (Blank rows = not tested, ^ = increased pain)  SHOULDER SPECIAL TESTS: Impingement  tests: Neer impingement test: positive , Hawkins/Kennedy impingement test: positive , and Painful arc test: positive  Rotator cuff assessment: Empty can test: negative and Full can test: negative  JOINT MOBILITY TESTING:  Mildly restricted  PALPATION:  TTP in L anterolateral deltoid and pecs    TODAY'S TREATMENT:  08/29/23 Therapeutic Exercise: to improve strength and mobility.  Demo, verbal and tactile cues throughout for technique.  Nustep L5x31min UE/LE  IR behind back 2x10 Lat pulls 20# 2x10 Standing shoulder extension 10# 2x10  Seated rows 15# 2x10  08/14/23  QuickDash, objective measures, education on progress/goals/POC- ongoing PT may be beneficial in combo  with new cortisone shot next week    TherEx  UBE L2 x6 minutes (3 min forward/3 min back)     Manual  PROM/stretching + grade III GH mobs  and overpressure from PT all directions to tolerance       08/03/23 THERAPEUTIC EXERCISE: to improve flexibility, strength and mobility.  Demonstration, verbal and tactile cues throughout for technique. UBE - L2.0 x 6 min (3' each fwd & back) L shoulder S/L sleeper IR stretch 10 x 5" Standing YTB scap retraction + B shoulder ER 10 x 3" Standing RTB scap retraction + B shoulder row 10 x 3" - cues to avoid shoulder shrug Standing RTB scap retraction + B shoulder extension 10 x 3"  Lower trap setting at wall ("V" wall slide with slight lift off) 10 x 3" Serratus push-up on wall x 10 Serratus wall clocks with looped YTB at wrists x 10 bil  MODALITIES: Iontophoresis with 1.0 mL 4mg /ml Dexamethasone - 4-6 hour patch (73mA-min) to L anterior shoulder (patch #4 of 6)   07/31/23 THERAPEUTIC EXERCISE: to improve flexibility, strength and mobility.  Demonstration, verbal and tactile cues throughout for technique. UBE - L2.0 x 6 min (3' each fwd & back) Supine L shoulder flexion 2x10 AROM Supine L shoulder CW/CCW circles 2x10 both ways 2# Supine L scap protraction 2x10 2# S/L L shoulder flexion 2x10 S/L L shoulder ER x 10 2# Seated L shoulder row and ext bent over 2# x 10  L shoulder IR behind back with strap x 10   MANUAL THERAPY: To promote normalized muscle tension, improved flexibility, improved joint mobility, increased ROM, and reduced pain. STM to L IS, IASTM with s/s/ tools UT,LS   07/27/23 THERAPEUTIC EXERCISE: to improve flexibility, strength and mobility.  Demonstration, verbal and tactile cues throughout for technique. Pulleys - Flexion and scaption x 3' each L shoulder wand AAROM - drawing up back and across back x 10 each L shoulder IR towel stretch x 10 Standing RTB scap retraction + B shoulder row 10 x 3" Standing RTB scap  retraction + B shoulder extension 10 x 3" - cues to avoid shoulder shrug Seated BATCA low row 10# x 10 Standing BATCA cable row 5# x 10 Standing BATCA cable shoulder extension 5# x 10 Standing BATCA lat pull down 10# x 10 Seated BATCA lat pull down 10# x 10  THERAPEUTIC ACTIVITIES: Shoulder MMT  MODALITIES: Iontophoresis with 1.0 mL 4mg /ml Dexamethasone - 4-6 hour patch (88mA-min) to L anterior shoulder (patch #3 of 6)   PATIENT EDUCATION:  Education details: HEP progression - scapular strengthening, HEP modification - focus on only 1 version of shoulder IR stretch, and Ionto patch wearing instructions  Person educated: Patient Education method: Explanation, Demonstration, Verbal cues, and MedBridgeGO app updated Education comprehension: verbalized understanding, returned demonstration, verbal cues required, and needs further education  HOME  EXERCISE PROGRAM: *Access Code: [email or web address] URL: [email or web address] Date: 08/29/2023 Prepared by: Verta Ellen  Exercises - Doorway Pec Stretch at 60 Degrees Abduction with Arm Straight (Mirrored)  - 2 x daily - 7 x weekly - 3 reps - 30 sec hold - Single Arm Doorway Pec Stretch at 90 Degrees Abduction (Mirrored)  - 2 x daily - 7 x weekly - 3 reps - 30 sec hold - Single Arm Doorway Pec Stretch at 120 Degrees Abduction (Mirrored)  - 2 x daily - 7 x weekly - 3 reps - 30 sec hold - Sidelying Shoulder Abduction Palm Forward  - 1 x daily - 7 x weekly - 2 sets - 10 reps - Sidelying Shoulder Flexion 15 Degrees  - 1 x daily - 7 x weekly - 2 sets - 10 reps - Sidelying Shoulder Horizontal Abduction  - 1 x daily - 7 x weekly - 2 sets - 10 reps - Standing Shoulder Internal Rotation Stretch with Towel  - 1 x daily - 7 x weekly - 2 sets - 10 reps - 3-5 sec hold - Standing Bilateral Shoulder Internal Rotation AAROM with Dowel  - 1 x daily - 7 x weekly - 2 sets - 10 reps - 3-5 sec hold - Sleeper Stretch  - 1 x daily - 7 x weekly - 2 sets - 10  reps - 3-5 sec hold - Shoulder External Rotation and Scapular Retraction with Resistance  - 1 x daily - 7 x weekly - 2 sets - 10 reps - 3-5 sec hold  *Pt using MedBridgeGO app  ASSESSMENT:  CLINICAL IMPRESSION: Pt reports she is doing well. She has been going to the gym. All STGs are met as of now. We condense her HEP today to allow better compliance for exercises. Reviewed gym machines for understanding and safety with gym routine.   OBJECTIVE IMPAIRMENTS: decreased activity tolerance, decreased knowledge of condition, decreased ROM, decreased strength, hypomobility, increased fascial restrictions, impaired perceived functional ability, increased muscle spasms, impaired flexibility, impaired UE functional use, improper body mechanics, postural dysfunction, and pain.   ACTIVITY LIMITATIONS: lifting, sleeping, bathing, dressing, reach over head, and hygiene/grooming  PARTICIPATION LIMITATIONS: meal prep, cleaning, laundry, community activity, and occupation  PERSONAL FACTORS: Past/current experiences, Time since onset of injury/illness/exacerbation, and 3+ comorbidities: Osteopenia, GERD, anxiety, depression, insomnia  are also affecting patient's functional outcome.   REHAB POTENTIAL: Good  CLINICAL DECISION MAKING: Stable/uncomplicated  EVALUATION COMPLEXITY: Low   GOALS: Goals reviewed with patient? Yes  SHORT TERM GOALS: Target date: 08/28/2023    Patient will be independent with initial HEP to improve outcomes and carryover.  Baseline:  Goal status: MET - 07/20/23   2.  Patient will report 25% reduction in L shoulder pain. Baseline: Up to 4/10 with certain movements Goal status: MET - 08/29/23  3.  Patient will demonstrate improved scapulohumeral kinematics for increased L shoulder ROM. Baseline:  Goal status: MET - 08/29/23  LONG TERM GOALS: Target date: 09/11/2023    Patient will be independent with ongoing/advanced HEP for self-management at home.  Baseline:   Goal status: IN PROGRESS 08/14/23  2.  Patient will report 50-75% improvement in L shoulder pain to improve QOL.  Baseline: Up to 4/10 with certain movements Goal status: MET - 08/29/23  3.  Patient to demonstrate improved upright posture with posterior shoulder girdle engaged to promote improved glenohumeral joint mobility. Baseline: fwd head, rounded shoulders, and L shoulder mildly depressed and protracted Goal status: IN PROGRESS  08/14/23  4.  Patient to improve L shoulder AROM to WFL/WNL without pain provocation to allow for increased ease of ADLs.  Baseline: Refer to above UE ROM table Goal status: IN PROGRESS -  08/14/23  5.  Patient will demonstrate improved L shoulder strength to >/= 4+/5 for functional UE use. Baseline: Refer to above UE MMT table Goal status: IN PROGRESS - 08/14/23  6  Patient will report </= 8% on QuickDASH to demonstrate improved functional ability.  Baseline: 18.2 / 100 = 18.2 % Goal status: IN PROGRESS 08/14/23  7.  Patient will resume working out at normal frequency without limitation due to L shoulder pain or LOM. Baseline: Patient has cut back on workouts to 1 time a week due to L shoulder pain Goal status: IN PROGRESS - 08/14/23- back to gym but not to normal frequency/intensity    PLAN:  PT FREQUENCY: 2x/week  PT DURATION: 4 weeks  PLANNED INTERVENTIONS: Therapeutic exercises, Therapeutic activity, Neuromuscular re-education, Patient/Family education, Self Care, Joint mobilization, Dry Needling, Electrical stimulation, Spinal mobilization, Cryotherapy, Moist heat, Splintting, Taping, Vasopneumatic device, Ultrasound, Ionotophoresis 4mg /ml Dexamethasone, Manual therapy, and Re-evaluation  PLAN FOR NEXT SESSION:  progress postural/scapular strengthening and L shoulder isometrics/resisted strengthening; progress gentle L shoulder AA/AROM;   MT +/- DN and/or modalities including ionto #5 as benefit noted for pain management PRN   Darleene Cleaver, PTA  08/29/23 3:53 PM    [facility] [address]  [address] [place], [place], [address] Phone: [phone]   Fax:  [phone]

## 2023-09-06 ENCOUNTER — Telehealth (HOSPITAL_BASED_OUTPATIENT_CLINIC_OR_DEPARTMENT_OTHER): Payer: Self-pay

## 2023-09-06 ENCOUNTER — Encounter: Payer: Self-pay | Admitting: Physical Therapy

## 2023-09-06 ENCOUNTER — Ambulatory Visit: Payer: 59 | Admitting: Family Medicine

## 2023-09-06 ENCOUNTER — Ambulatory Visit: Payer: 59 | Attending: Family Medicine | Admitting: Physical Therapy

## 2023-09-06 ENCOUNTER — Encounter: Payer: Self-pay | Admitting: Family Medicine

## 2023-09-06 VITALS — BP 112/68 | HR 65 | Temp 97.9°F | Ht 65.5 in | Wt 157.2 lb

## 2023-09-06 DIAGNOSIS — M8580 Other specified disorders of bone density and structure, unspecified site: Secondary | ICD-10-CM

## 2023-09-06 DIAGNOSIS — M25512 Pain in left shoulder: Secondary | ICD-10-CM | POA: Insufficient documentation

## 2023-09-06 DIAGNOSIS — E785 Hyperlipidemia, unspecified: Secondary | ICD-10-CM

## 2023-09-06 DIAGNOSIS — M6281 Muscle weakness (generalized): Secondary | ICD-10-CM | POA: Diagnosis present

## 2023-09-06 DIAGNOSIS — Z0000 Encounter for general adult medical examination without abnormal findings: Secondary | ICD-10-CM | POA: Diagnosis not present

## 2023-09-06 DIAGNOSIS — M7989 Other specified soft tissue disorders: Secondary | ICD-10-CM | POA: Diagnosis not present

## 2023-09-06 DIAGNOSIS — M25612 Stiffness of left shoulder, not elsewhere classified: Secondary | ICD-10-CM | POA: Insufficient documentation

## 2023-09-06 DIAGNOSIS — R293 Abnormal posture: Secondary | ICD-10-CM | POA: Insufficient documentation

## 2023-09-06 DIAGNOSIS — Z0184 Encounter for antibody response examination: Secondary | ICD-10-CM

## 2023-09-06 LAB — CBC WITH DIFFERENTIAL/PLATELET
Basophils Absolute: 0 10*3/uL (ref 0.0–0.1)
Basophils Relative: 0.4 % (ref 0.0–3.0)
Eosinophils Absolute: 0 10*3/uL (ref 0.0–0.7)
Eosinophils Relative: 1.2 % (ref 0.0–5.0)
HCT: 44.3 % (ref 36.0–46.0)
Hemoglobin: 14.6 g/dL (ref 12.0–15.0)
Lymphocytes Relative: 35.6 % (ref 12.0–46.0)
Lymphs Abs: 1.4 10*3/uL (ref 0.7–4.0)
MCHC: 32.9 g/dL (ref 30.0–36.0)
MCV: 86.4 fL (ref 78.0–100.0)
Monocytes Absolute: 0.3 10*3/uL (ref 0.1–1.0)
Monocytes Relative: 8.7 % (ref 3.0–12.0)
Neutro Abs: 2.1 10*3/uL (ref 1.4–7.7)
Neutrophils Relative %: 54.1 % (ref 43.0–77.0)
Platelets: 162 10*3/uL (ref 150.0–400.0)
RBC: 5.13 Mil/uL — ABNORMAL HIGH (ref 3.87–5.11)
RDW: 15.5 % (ref 11.5–15.5)
WBC: 4 10*3/uL (ref 4.0–10.5)

## 2023-09-06 LAB — HEPATIC FUNCTION PANEL
ALT: 16 U/L (ref 0–35)
AST: 20 U/L (ref 0–37)
Albumin: 4.3 g/dL (ref 3.5–5.2)
Alkaline Phosphatase: 52 U/L (ref 39–117)
Bilirubin, Direct: 0.3 mg/dL (ref 0.0–0.3)
Total Bilirubin: 1.9 mg/dL — ABNORMAL HIGH (ref 0.2–1.2)
Total Protein: 7.3 g/dL (ref 6.0–8.3)

## 2023-09-06 LAB — BASIC METABOLIC PANEL
BUN: 14 mg/dL (ref 6–23)
CO2: 30 meq/L (ref 19–32)
Calcium: 9.7 mg/dL (ref 8.4–10.5)
Chloride: 104 meq/L (ref 96–112)
Creatinine, Ser: 0.93 mg/dL (ref 0.40–1.20)
GFR: 65.98 mL/min (ref >60.00)
Glucose, Bld: 86 mg/dL (ref 70–99)
Potassium: 4.1 meq/L (ref 3.5–5.1)
Sodium: 141 meq/L (ref 135–145)

## 2023-09-06 LAB — LIPID PANEL
Cholesterol: 209 mg/dL — ABNORMAL HIGH (ref 0–200)
HDL: 96.7 mg/dL (ref >39.00)
LDL Cholesterol: 105 mg/dL — ABNORMAL HIGH (ref 0–99)
NonHDL: 112.62
Total CHOL/HDL Ratio: 2
Triglycerides: 39 mg/dL (ref 0.0–149.0)
VLDL: 7.8 mg/dL (ref 0.0–40.0)

## 2023-09-06 LAB — VITAMIN D 25 HYDROXY (VIT D DEFICIENCY, FRACTURES): VITD: 95.27 ng/mL (ref 30.00–100.00)

## 2023-09-06 LAB — TSH: TSH: 1.12 u[IU]/mL (ref 0.35–5.50)

## 2023-09-06 MED ORDER — FLUTICASONE PROPIONATE 50 MCG/ACT NA SUSP: 2.0000 | Freq: Every day | NASAL | 6 refills | Status: AC

## 2023-09-06 MED ORDER — OMEPRAZOLE 20 MG PO CPDR: 20.0000 mg | DELAYED_RELEASE_CAPSULE | Freq: Every day | ORAL | 3 refills | Status: DC

## 2023-09-06 NOTE — Patient Instructions (Addendum)
Follow up in 6 months to recheck cholesterol We'll notify you of your lab results and make any changes if needed Keep up the good work on healthy diet and regular exercise- you look great! We'll call you to schedule your thyroid ultrasound START the Omeprazole daily to control reflux We're doing the test today to see if you had chickenpox and need the shingles vaccine Call with any questions or concerns Stay Safe!  Stay Healthy! Happy Holidays!!!

## 2023-09-06 NOTE — Assessment & Plan Note (Signed)
Check Vit D and replete prn. 

## 2023-09-06 NOTE — Assessment & Plan Note (Signed)
Chronic problem.  Tolerating statin w/o difficulty.  Check labs.  Adjust meds prn  

## 2023-09-06 NOTE — Therapy (Signed)
OUTPATIENT PHYSICAL THERAPY TREATMENT    Patient Name: Courtney Calhoun MRN: [id number] DOB:1961/02/10, 62 y.o., female Today's Date: 09/06/2023   END OF SESSION:  PT End of Session - 09/06/23 1403     Visit Number 11    Date for PT Re-Evaluation 09/11/23    Authorization Type [employer]    Progress Note Due on Visit 19    PT Start Time 1403    PT Stop Time 1442    PT Time Calculation (min) 39 min    Activity Tolerance Patient tolerated treatment well    Behavior During Therapy WFL for tasks assessed/performed               Past Medical History:  Diagnosis Date   Allergy    Anemia    Anxiety    Depression    Gilbert's syndrome    Glaucoma    Hematuria    Hyperlipidemia    Osteopenia    Past Surgical History:  Procedure Laterality Date   CHOLECYSTECTOMY     EYE SURGERY     INNER EAR SURGERY     WISDOM TOOTH EXTRACTION     Patient Active Problem List   Diagnosis Date Noted   ETD (Eustachian tube dysfunction), right 07/04/2020   Overweight (BMI 25.0-29.9) 02/24/2020   Pain in joint of right hip 10/30/2019   Lower limb pain, inferior, right 10/30/2019   Insomnia 08/26/2019   GERD (gastroesophageal reflux disease) 08/26/2019   Palpitations 08/26/2019   Unilateral osteoarthritis of first carpometacarpal (CMC) joint 10/15/2015   General medical examination 07/13/2011   Hyperlipidemia 11/08/2010   Allergic rhinitis 11/08/2010   Osteopenia 11/08/2010    PCP: Sheliah Hatch, MD   REFERRING PROVIDER: Rodolph Bong, MD   REFERRING DIAG: [phone] (ICD-10-CM) - Left shoulder pain, unspecified chronicity   THERAPY DIAG:  Acute pain of left shoulder  Stiffness of left shoulder, not elsewhere classified  Abnormal posture  Muscle weakness (generalized)  RATIONALE FOR EVALUATION AND TREATMENT: Rehabilitation  ONSET DATE: May 2024  NEXT MD VISIT: 08/21/2023   SUBJECTIVE:                                                                                                                                                                                                          SUBJECTIVE STATEMENT: Shoulder is doing good.   PAIN: Are you having pain? Yes: NPRS scale: 0/10 Pain location: L arm when going into IR   Pain description: sore and tight  Aggravating factors: reaching into IR  Relieving factors: exercises, using  arm   PERTINENT HISTORY:  Osteopenia, GERD, anxiety, depression, insomnia  PRECAUTIONS: None  RED FLAGS: None  HAND DOMINANCE: Right  WEIGHT BEARING RESTRICTIONS: No  FALLS:  Has patient fallen in last 6 months? Yes. 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 No acute fractures.   06/14/23 - Diagnostic Limited MSK Ultrasound of: Left shoulder Mild subacromial bursitis and AC DJD with effusion are present.  Otherwise the shoulder examination is normal on ultrasound. Impression: Subacromial bursitis  PATIENT SURVEYS:  Quick Dash 18.2 / 100 = 18.2 %; 08/14/23 36.4%    COGNITION: Overall cognitive status: Within functional limits for tasks assessed     SENSATION: WFL  POSTURE: rounded shoulders, forward head, and L shoulder mildly depressed and protracted  UPPER EXTREMITY ROM:   Active ROM Right eval Left eval L 07/20/23 L 08/14/23  Shoulder flexion 161 144 - tight 142 - tight 140*  Shoulder extension 66 38 ^ 40 ^   Shoulder  abduction 160 87 ^ 124 ^ 108* before trunk compensations   Shoulder adduction      Shoulder internal rotation FIR WNL FIR sacrum ^ FIR sacrum ^ FIR L5   Shoulder external rotation FER T4 FER T4 FER T4 FER T4  Elbow flexion      Elbow extension      Wrist flexion      Wrist extension      Wrist ulnar deviation      Wrist radial deviation      Wrist pronation      Wrist supination      (Blank rows = not tested, ^ = increased pain)  UPPER EXTREMITY MMT:  MMT Right eval Left eval R 07/27/23 L 07/27/23 R 08/14/23 L 08/14/23 L 12/4  Shoulder flexion 5 4+ ^ 5 4+ 4+ 4+ 4+  Shoulder extension 4 4 ^ 4+ 4   4+  Shoulder abduction 5 3- ^ 4+ 3+ ^ 4+ 3 pain limited  4+ mild pain  Shoulder adduction         Shoulder internal rotation 5  4- ^ 5 4 ^ 4+ 4+ 4+  Shoulder external rotation 4+ 4 ^ 4+ 4+ 4+ 4+ 5  Middle trapezius         Lower trapezius         Elbow flexion         Elbow extension         Wrist flexion         Wrist extension         Wrist ulnar deviation         Wrist radial deviation         Wrist pronation         Wrist supination         Grip strength (lbs)         (Blank rows = not tested, ^ = increased pain)  SHOULDER SPECIAL TESTS: Impingement tests:  Neer impingement test: positive , Hawkins/Kennedy impingement test: positive , and Painful arc test: positive  Rotator cuff assessment: Empty can test: negative and Full can test: negative  JOINT MOBILITY TESTING:  Mildly restricted  PALPATION:  TTP in L anterolateral deltoid and pecs    TODAY'S TREATMENT:   09/06/23 Therapeutic Exercise: to improve strength and mobility.  Demo, verbal and tactile cues throughout for technique.  UBE L2 X 6 min (3' each fwd & back) IR behind back 2x10 Lat pulls 20# 2x10 Standing shoulder extension 10# 2x10  Seated rows 15# 2x10 Wall push ups military and wide x 10 ea Wall clocks 2 sets to fatigue using red weighted ball Standing 3 way raises 2# x 10 B - USED 1# for ABD on  L Standing ER with red band x 20 Serratus wall clocks with looped YTB at wrists x 10 bil - cues for correct form   08/29/23 Therapeutic Exercise: to improve strength and mobility.  Demo, verbal and tactile cues throughout for technique.  Nustep L5x22min UE/LE  IR behind back 2x10 Lat pulls 20# 2x10 one standing and one sitting Standing shoulder extension 10# 2x10  Seated low rows 20# 2x10  08/14/23  QuickDash, objective measures, education on progress/goals/POC- ongoing PT may be beneficial in combo with new cortisone shot next week    TherEx  UBE L2 x6 minutes (3 min forward/3 min back)  Manual  PROM/stretching + grade III GH mobs  and overpressure from PT all directions to tolerance    08/03/23 THERAPEUTIC EXERCISE: to improve flexibility, strength and mobility.  Demonstration, verbal and tactile cues throughout for technique. UBE - L2.0 x 6 min (3' each fwd & back) L shoulder S/L sleeper IR stretch 10 x 5" Standing YTB scap retraction + B shoulder ER 10 x 3" Standing RTB scap retraction + B shoulder row 10 x 3" - cues to avoid shoulder shrug Standing RTB scap retraction + B shoulder extension 10 x 3"  Lower trap setting at wall ("V" wall slide with slight lift off) 10 x 3" Serratus push-up on wall x 10 Serratus wall clocks with looped YTB at wrists x 10 bil  MODALITIES: Iontophoresis with 1.0 mL 4mg /ml Dexamethasone - 4-6 hour patch (71mA-min) to L anterior shoulder (patch #4 of 6)   07/31/23 THERAPEUTIC EXERCISE: to improve flexibility, strength and mobility.  Demonstration, verbal and tactile cues throughout for technique. UBE - L2.0 x 6 min (3' each fwd & back) Supine L shoulder flexion 2x10 AROM Supine L shoulder CW/CCW circles 2x10 both ways 2# Supine L scap protraction 2x10 2# S/L L shoulder flexion 2x10 S/L L shoulder ER x 10 2# Seated L shoulder row and ext bent over 2# x 10  L shoulder IR behind back with strap x 10   MANUAL THERAPY: To promote  normalized muscle tension, improved flexibility, improved joint mobility, increased ROM, and reduced pain. STM to L IS, IASTM with s/s/ tools UT,LS   07/27/23 THERAPEUTIC EXERCISE: to improve flexibility, strength and mobility.  Demonstration, verbal and tactile cues throughout for technique. Pulleys - Flexion and scaption x 3' each L shoulder wand AAROM - drawing up back and across back x 10 each L shoulder IR towel stretch x 10 Standing RTB scap retraction + B shoulder row 10 x 3" Standing RTB scap retraction + B shoulder extension 10 x 3" - cues to avoid shoulder shrug Seated BATCA low row 10# x 10 Standing BATCA cable row 5# x 10 Standing BATCA cable  shoulder extension 5# x 10 Standing BATCA lat pull down 10# x 10 Seated BATCA lat pull down 10# x 10  THERAPEUTIC ACTIVITIES: Shoulder MMT  MODALITIES: Iontophoresis with 1.0 mL 4mg /ml Dexamethasone - 4-6 hour patch (33mA-min) to L anterior shoulder (patch #3 of 6)   PATIENT EDUCATION:  Education details: HEP progression - scapular strengthening, HEP modification - focus on only 1 version of shoulder IR stretch, and Ionto patch wearing instructions  Person educated: Patient Education method: Explanation, Demonstration, Verbal cues, and MedBridgeGO app updated Education comprehension: verbalized understanding, returned demonstration, verbal cues required, and needs further education  HOME EXERCISE PROGRAM: *Access Code: [email or web address] URL: [email or web address] Date: 08/29/2023 Prepared by: Verta Ellen  Exercises - Doorway Pec Stretch at 60 Degrees Abduction with Arm Straight (Mirrored)  - 2 x daily - 7 x weekly - 3 reps - 30 sec hold - Single Arm Doorway Pec Stretch at 90 Degrees Abduction (Mirrored)  - 2 x daily - 7 x weekly - 3 reps - 30 sec hold - Single Arm Doorway Pec Stretch at 120 Degrees Abduction (Mirrored)  - 2 x daily - 7 x weekly - 3 reps - 30 sec hold - Sidelying Shoulder Abduction Palm Forward  - 1 x  daily - 7 x weekly - 2 sets - 10 reps - Sidelying Shoulder Flexion 15 Degrees  - 1 x daily - 7 x weekly - 2 sets - 10 reps - Sidelying Shoulder Horizontal Abduction  - 1 x daily - 7 x weekly - 2 sets - 10 reps - Standing Shoulder Internal Rotation Stretch with Towel  - 1 x daily - 7 x weekly - 2 sets - 10 reps - 3-5 sec hold - Standing Bilateral Shoulder Internal Rotation AAROM with Dowel  - 1 x daily - 7 x weekly - 2 sets - 10 reps - 3-5 sec hold - Sleeper Stretch  - 1 x daily - 7 x weekly - 2 sets - 10 reps - 3-5 sec hold - Shoulder External Rotation and Scapular Retraction with Resistance  - 1 x daily - 7 x weekly - 2 sets - 10 reps - 3-5 sec hold  *Pt using MedBridgeGO app  ASSESSMENT:  CLINICAL IMPRESSION: Courtney Calhoun reports no pain today upon arrival. She tolerated TE well and was able to progress resistance with rows and ER. Her MMT reveals she still has weakness with all shoulder planes except ER. She has mild pain with resisted ABD. She was challenged with standing 3 way raises (especially ABD) and wall clocks with weighted ball. She will benefit from ongoing strengthening to the left shoulder to meet her LTGs.   OBJECTIVE IMPAIRMENTS: decreased activity tolerance, decreased knowledge of condition, decreased ROM, decreased strength, hypomobility, increased fascial restrictions, impaired perceived functional ability, increased muscle spasms, impaired flexibility, impaired UE functional use, improper body mechanics, postural dysfunction, and pain.   ACTIVITY LIMITATIONS: lifting, sleeping, bathing, dressing, reach over head, and hygiene/grooming  PARTICIPATION LIMITATIONS: meal prep, cleaning, laundry, community activity, and occupation  PERSONAL FACTORS: Past/current experiences, Time since onset of injury/illness/exacerbation, and 3+ comorbidities: Osteopenia, GERD, anxiety, depression, insomnia  are also affecting patient's functional outcome.   REHAB POTENTIAL: Good  CLINICAL  DECISION MAKING: Stable/uncomplicated  EVALUATION COMPLEXITY: Low   GOALS: Goals reviewed with patient? Yes  SHORT TERM GOALS: Target date: 08/28/2023    Patient will be independent with initial HEP to improve outcomes and carryover.  Baseline:  Goal status: MET - 07/20/23   2.  Patient will report 25%  reduction in L shoulder pain. Baseline: Up to 4/10 with certain movements Goal status: MET - 08/29/23  3.  Patient will demonstrate improved scapulohumeral kinematics for increased L shoulder ROM. Baseline:  Goal status: MET - 08/29/23  LONG TERM GOALS: Target date: 09/11/2023    Patient will be independent with ongoing/advanced HEP for self-management at home.  Baseline:  Goal status: IN PROGRESS 08/14/23  2.  Patient will report 50-75% improvement in L shoulder pain to improve QOL.  Baseline: Up to 4/10 with certain movements Goal status: MET - 08/29/23  3.  Patient to demonstrate improved upright posture with posterior shoulder girdle engaged to promote improved glenohumeral joint mobility. Baseline: fwd head, rounded shoulders, and L shoulder mildly depressed and protracted Goal status: IN PROGRESS  08/14/23  4.  Patient to improve L shoulder AROM to WFL/WNL without pain provocation to allow for increased ease of ADLs.  Baseline: Refer to above UE ROM table Goal status: IN PROGRESS -  08/14/23  5.  Patient will demonstrate improved L shoulder strength to >/= 4+/5 for functional UE use. Baseline: Refer to above UE MMT table Goal status: PARTIALLY MET - 09/06/23 - functional ABD still limited to 1# wt  6  Patient will report </= 8% on QuickDASH to demonstrate improved functional ability.  Baseline: 18.2 / 100 = 18.2 % Goal status: IN PROGRESS 08/14/23  7.  Patient will resume working out at normal frequency without limitation due to L shoulder pain or LOM. Baseline: Patient has cut back on workouts to 1 time a week due to L shoulder pain Goal status: IN PROGRESS -  08/14/23- back to gym but not to normal frequency/intensity    PLAN:  PT FREQUENCY: 2x/week  PT DURATION: 4 weeks  PLANNED INTERVENTIONS: Therapeutic exercises, Therapeutic activity, Neuromuscular re-education, Patient/Family education, Self Care, Joint mobilization, Dry Needling, Electrical stimulation, Spinal mobilization, Cryotherapy, Moist heat, Splintting, Taping, Vasopneumatic device, Ultrasound, Ionotophoresis 4mg /ml Dexamethasone, Manual therapy, and Re-evaluation  PLAN FOR NEXT SESSION:  progress postural/scapular strengthening and L shoulder isometrics/resisted strengthening; progress gentle L shoulder AA/AROM;   MT +/- DN and/or modalities including ionto #5 as benefit noted for pain management PRN   Solon Palm, PT  09/06/23 2:53 PM    [facility] [address]  [address] [place], [place], [address] Phone: [phone]   Fax:  [phone]

## 2023-09-06 NOTE — Progress Notes (Signed)
   Subjective:    Patient ID: Courtney Calhoun, female    DOB: Nov 26, 1960, 62 y.o.   MRN: [id number]  HPI CPE- UTD on pap, mammo, DEXA, colonoscopy, flu.  Due for Tdap- pt states she is UTD bc she works for [employer].  Patient Care Team    Relationship Specialty Notifications Start End  Sheliah Hatch, MD PCP - General Family Medicine  05/23/11   Vernia Buff, DO Referring Physician Osteopathic Medicine  09/29/15   Teodora Medici, MD Consulting Physician Gynecology  09/30/15     Health Maintenance  Topic Date Due   HIV Screening  Never done   Hepatitis C Screening  Never done   Zoster Vaccines- Shingrix (1 of 2) Never done   DTaP/Tdap/Td (2 - Td or Tdap) 05/03/2022   Cervical Cancer Screening (HPV/Pap Cotest)  05/16/2023   COVID-19 Vaccine (10 - 2023-24 season) 06/04/2023   MAMMOGRAM  07/27/2024   Colonoscopy  02/10/2026   INFLUENZA VACCINE  Completed   HPV VACCINES  Aged Out      Review of Systems Patient reports no vision/ hearing changes, adenopathy,fever, weight change,  persistant/recurrent hoarseness , swallowing issues, chest pain, palpitations, edema, persistant/recurrent cough, hemoptysis, dyspnea (rest/exertional/paroxysmal nocturnal), gastrointestinal bleeding (melena, rectal bleeding), abdominal pain, bowel changes, GU symptoms (dysuria, hematuria, incontinence), Gyn symptoms (abnormal  bleeding, pain),  syncope, focal weakness, memory loss, numbness & tingling, skin/hair/nail changes, abnormal bruising or bleeding, anxiety, or depression.   + GERD- previously on PPI, would like to resume    Objective:   Physical Exam General Appearance:    Alert, cooperative, no distress, appears stated age  Head:    Normocephalic, without obvious abnormality, atraumatic  Eyes:    PERRL, conjunctiva/corneas clear, EOM's intact both eyes  Ears:    Normal TM's and external ear canals, both ears  Nose:   Nares normal, septum midline, mucosa normal, no drainage    or sinus tenderness   Throat:   Lips, mucosa, and tongue normal; teeth and gums normal  Neck:   Supple, symmetrical, trachea midline, no adenopathy;    Thyroid: no enlargement/tenderness/nodules, soft tissue mass just superior to sternal notch  Back:     Symmetric, no curvature, ROM normal, no CVA tenderness  Lungs:     Clear to auscultation bilaterally, respirations unlabored  Chest Wall:    No tenderness or deformity   Heart:    Regular rate and rhythm, S1 and S2 normal, no murmur, rub   or gallop  Breast Exam:    Deferred to GYN  Abdomen:     Soft, non-tender, bowel sounds active all four quadrants,    no masses, no organomegaly  Genitalia:    Deferred to GYN  Rectal:    Extremities:   Extremities normal, atraumatic, no cyanosis or edema  Pulses:   2+ and symmetric all extremities  Skin:   Skin color, texture, turgor normal, no rashes or lesions  Lymph nodes:   Cervical, supraclavicular, and axillary nodes normal  Neurologic:   CNII-XII intact, normal strength, sensation and reflexes    throughout          Assessment & Plan:

## 2023-09-06 NOTE — Assessment & Plan Note (Signed)
Pt's PE WNL w/ exception of soft tissue mass just above the sternal notch (will [place]).  UTD on pap, mammo, DEXA, colonoscopy, flu.  Pt states she is UTD on Tdap but we don't have record of this.  Check labs.  Anticipatory guidance provided.

## 2023-09-07 ENCOUNTER — Ambulatory Visit (HOSPITAL_BASED_OUTPATIENT_CLINIC_OR_DEPARTMENT_OTHER)
Admission: RE | Admit: 2023-09-07 | Discharge: 2023-09-07 | Disposition: A | Discharge status: Home / Self Care | Payer: 59 | Source: Ambulatory Visit | Attending: Family Medicine | Admitting: Family Medicine

## 2023-09-07 ENCOUNTER — Other Ambulatory Visit: Payer: Self-pay | Admitting: Family Medicine

## 2023-09-07 DIAGNOSIS — Z0000 Encounter for general adult medical examination without abnormal findings: Secondary | ICD-10-CM

## 2023-09-07 DIAGNOSIS — M7989 Other specified soft tissue disorders: Secondary | ICD-10-CM | POA: Insufficient documentation

## 2023-09-07 DIAGNOSIS — E785 Hyperlipidemia, unspecified: Secondary | ICD-10-CM

## 2023-09-07 DIAGNOSIS — M8580 Other specified disorders of bone density and structure, unspecified site: Secondary | ICD-10-CM

## 2023-09-07 DIAGNOSIS — Z0184 Encounter for antibody response examination: Secondary | ICD-10-CM

## 2023-09-07 LAB — VARICELLA ZOSTER ANTIBODY, IGG: Varicella IgG: 26 {s_co_ratio}

## 2023-09-08 ENCOUNTER — Telehealth: Payer: Self-pay

## 2023-09-08 NOTE — Telephone Encounter (Signed)
-----   Message from Neena Rhymes sent at 09/08/2023  7:44 AM EST ----- Labs are stable and look great!  According to your labs, you have had chicken pox and could develop shingles.  A shingles vaccine is recommended.

## 2023-09-08 NOTE — Telephone Encounter (Signed)
Lab results have been discussed.   Verbalized understanding? Yes  Are there any questions? No  I let the patient know that she could get her shingles vaccine at her next appt.

## 2023-09-11 ENCOUNTER — Ambulatory Visit: Payer: 59 | Admitting: Physical Therapy

## 2023-09-11 ENCOUNTER — Encounter: Payer: Self-pay | Admitting: Physical Therapy

## 2023-09-11 ENCOUNTER — Telehealth: Payer: Self-pay

## 2023-09-11 DIAGNOSIS — M25612 Stiffness of left shoulder, not elsewhere classified: Secondary | ICD-10-CM

## 2023-09-11 DIAGNOSIS — M6281 Muscle weakness (generalized): Secondary | ICD-10-CM

## 2023-09-11 DIAGNOSIS — M25512 Pain in left shoulder: Secondary | ICD-10-CM

## 2023-09-11 DIAGNOSIS — R293 Abnormal posture: Secondary | ICD-10-CM

## 2023-09-11 NOTE — Therapy (Signed)
OUTPATIENT PHYSICAL THERAPY TREATMENT / DISCHARGE SUMMARY    Patient Name: Courtney Calhoun MRN: [id number] DOB:[birth year], 62 y.o., female Today's Date: 09/11/2023   END OF SESSION:  PT End of Session - 09/11/23 1401     Visit Number 12    Date for PT Re-Evaluation 09/11/23    Authorization Type [employer]    Progress Note Due on Visit 19    PT Start Time 1401    PT Stop Time 1441    PT Time Calculation (min) 40 min    Activity Tolerance Patient tolerated treatment well    Behavior During Therapy WFL for tasks assessed/performed                Past Medical History:  Diagnosis Date   Allergy    Anemia    Anxiety    Depression    Gilbert's syndrome    Glaucoma    Hematuria    Hyperlipidemia    Osteopenia    Past Surgical History:  Procedure Laterality Date   CHOLECYSTECTOMY     EYE SURGERY     INNER EAR SURGERY     WISDOM TOOTH EXTRACTION     Patient Active Problem List   Diagnosis Date Noted   ETD (Eustachian tube dysfunction), right 07/04/2020   Overweight (BMI 25.0-29.9) 02/24/2020   Pain in joint of right hip 10/30/2019   Lower limb pain, inferior, right 10/30/2019   Insomnia 08/26/2019   GERD (gastroesophageal reflux disease) 08/26/2019   Palpitations 08/26/2019   Unilateral osteoarthritis of first carpometacarpal (CMC) joint 10/15/2015   General medical examination 07/13/2011   Hyperlipidemia 11/08/2010   Allergic rhinitis 11/08/2010   Osteopenia 11/08/2010    PCP: Sheliah Hatch, MD   REFERRING PROVIDER: Rodolph Bong, MD   REFERRING DIAG: [phone] (ICD-10-CM) - Left shoulder pain, unspecified chronicity   THERAPY DIAG:  Acute pain of left shoulder  Stiffness of left shoulder, not elsewhere classified  Abnormal posture  Muscle weakness (generalized)  RATIONALE FOR EVALUATION AND TREATMENT: Rehabilitation  ONSET DATE: May 2024  NEXT MD VISIT: 08/21/2023   SUBJECTIVE:                                                                                                                                                                                                          SUBJECTIVE STATEMENT: Pt reports her shoulder is doing much better - probably not 100%, but better able to do things like reach behind her back.  PAIN: Are you having pain? Yes: NPRS scale: 0/10 Pain location: L arm  when going into IR   Pain description: sore and tight  Aggravating factors: reaching into IR  Relieving factors: exercises, using arm   PERTINENT HISTORY:  Osteopenia, GERD, anxiety, depression, insomnia  PRECAUTIONS: None  RED FLAGS: None  HAND DOMINANCE: Right  WEIGHT BEARING RESTRICTIONS: No  FALLS:  Has patient fallen in last 6 months? Yes. 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 No acute fractures.   06/14/23 - Diagnostic Limited MSK Ultrasound of: Left shoulder Mild subacromial bursitis and AC DJD with effusion are present.  Otherwise the shoulder examination is normal on ultrasound. Impression: Subacromial bursitis  PATIENT SURVEYS:  Quick Dash 18.2 / 100 = 18.2 %; 08/14/23 36.4%;  09/11/23: 4.5 / 100 = 4.5 %    COGNITION: Overall cognitive status: Within functional limits for tasks assessed     SENSATION: WFL  POSTURE: rounded shoulders, forward head, and L shoulder mildly depressed and protracted  UPPER EXTREMITY ROM:   Active  ROM Right eval Left eval L 07/20/23 L 08/14/23 L   09/11/23  Shoulder flexion 161 144 - tight 142 - tight 140* 159  Shoulder extension 66 38 ^ 40 ^  57  Shoulder abduction 160 87 ^ 124 ^ 108* before trunk compensations  150  Shoulder adduction       Shoulder internal rotation FIR WNL FIR sacrum ^ FIR sacrum ^ FIR L5  FIR WFL  Shoulder external rotation FER T4 FER T4 FER T4 FER T4 FER WFL  Elbow flexion       Elbow extension       Wrist flexion       Wrist extension       Wrist ulnar deviation       Wrist radial deviation       Wrist pronation       Wrist supination       (Blank rows = not tested, ^ = increased pain)  UPPER EXTREMITY MMT:  MMT Right eval Left eval R 07/27/23 L 07/27/23 R 08/14/23 L 08/14/23 L   09/06/23  Shoulder flexion 5 4+ ^ 5 4+ 4+ 4+ 4+  Shoulder extension 4 4 ^ 4+ 4   4+  Shoulder abduction 5 3- ^ 4+ 3+ ^ 4+ 3 pain limited  4+ mild pain  Shoulder adduction         Shoulder internal rotation 5  4- ^ 5 4 ^ 4+ 4+ 4+  Shoulder external rotation 4+ 4 ^ 4+ 4+ 4+ 4+ 5  Middle trapezius         Lower trapezius         Elbow flexion         Elbow extension         Wrist flexion         Wrist extension         Wrist ulnar deviation         Wrist radial deviation  Wrist pronation         Wrist supination         Grip strength (lbs)         (Blank rows = not tested, ^ = increased pain)  SHOULDER SPECIAL TESTS: Impingement tests: Neer impingement test: positive , Hawkins/Kennedy impingement test: positive , and Painful arc test: positive  Rotator cuff assessment: Empty can test: negative and Full can test: negative  JOINT MOBILITY TESTING:  Mildly restricted  PALPATION:  TTP in L anterolateral deltoid and pecs    TODAY'S TREATMENT:   09/08/23 THERAPEUTIC EXERCISE: to improve flexibility, strength and mobility.  Demonstration, verbal and tactile cues throughout for technique. UBE - L2.5 x 6 min (3' each fwd & back) Standing RTB scap retraction + B  shoulder ER 10 x 3" S/L L shoulder abduction 1# 2 x 10 S/L L shoulder flexion 1# 2 x 10 S/L L shoulder horiz ABD 1# 2 x 10  THERAPEUTIC ACTIVITIES: L shoulder ROM assessment QuickDASH:  4.5 / 100 = 4.5 %   09/06/23 Therapeutic Exercise: to improve strength and mobility.  Demo, verbal and tactile cues throughout for technique.  UBE L2 X 6 min (3' each fwd & back) IR behind back 2x10 Lat pulls 20# 2x10 Standing shoulder extension 10# 2x10  Seated rows 15# 2x10 Wall push ups military and wide x 10 ea Wall clocks 2 sets to fatigue using red weighted ball Standing 3 way raises 2# x 10 B - USED 1# for ABD on L Standing ER with red band x 20 Serratus wall clocks with looped YTB at wrists x 10 bil - cues for correct form   08/29/23 Therapeutic Exercise: to improve strength and mobility.  Demo, verbal and tactile cues throughout for technique.  Nustep L5x23min UE/LE  IR behind back 2x10 Lat pulls 20# 2x10 one standing and one sitting Standing shoulder extension 10# 2x10  Seated low rows 20# 2x10   PATIENT EDUCATION:  Education details: HEP review, HEP update, recommended frequency for ongoing HEP at discharge to prevent loss of gains achieved with PT, and Ionto patch wearing instructions  Person educated: Patient Education method: Explanation, Demonstration, Verbal cues, and MedBridgeGO app updated Education comprehension: verbalized understanding and returned demonstration  HOME EXERCISE PROGRAM: *Access Code: [email or web address] URL: [email or web address] Date: 09/11/2023 Prepared by: Glenetta Hew  Exercises - Doorway Pec Stretch at 60 Degrees Abduction with Arm Straight (Mirrored)  - 2 x daily - 7 x weekly - 3 reps - 30 sec hold - Single Arm Doorway Pec Stretch at 90 Degrees Abduction (Mirrored)  - 2 x daily - 7 x weekly - 3 reps - 30 sec hold - Single Arm Doorway Pec Stretch at 120 Degrees Abduction (Mirrored)  - 2 x daily - 7 x weekly - 3 reps - 30 sec hold - Sidelying  Shoulder Abduction Palm Forward  - 1 x daily - 3 x weekly - 2 sets - 10 reps - Sidelying Shoulder Flexion 15 Degrees  - 1 x daily - 3 x weekly - 2 sets - 10 reps - Sidelying Shoulder Horizontal Abduction  - 1 x daily - 3 x weekly - 2 sets - 10 reps - Standing Shoulder Internal Rotation Stretch with Towel  - 1 x daily - 7 x weekly - 2 sets - 10 reps - 3-5 sec hold - Standing Bilateral Shoulder Internal Rotation AAROM with Dowel  - 1 x daily - 7 x weekly - 2 sets - 10 reps - 3-5 sec  hold - Sleeper Stretch  - 1 x daily - 7 x weekly - 2 sets - 10 reps - 3-5 sec hold - Shoulder External Rotation and Scapular Retraction with Resistance  - 1 x daily - 3 x weekly - 2 sets - 10 reps - 3-5 sec hold - Standing Bilateral Low Shoulder Row with Anchored Resistance  - 1 x daily - 3 x weekly - 2 sets - 10 reps - 5 sec hold - Scapular Retraction with Resistance Advanced  - 1 x daily - 3 x weekly - 2 sets - 10 reps - 5 sec hold  *Pt using MedBridgeGO app  ASSESSMENT:  CLINICAL IMPRESSION: Teresa reports significant improvement in L shoulder ROM and functional movement, now able to reach behind her back symmetrical to R.  Overall L shoulder ROM now WFL/WNL and L shoulder strength 4+ to 5/5.  She has been able to gradually progress her gym workouts w/o limitation due to her L shoulder and QuickDASH now improved to 4.5%.  We reviewed her HEP and discussed recommended ongoing frequency to prevent loss of gains achieved with PT.  All PT goals now met and Deilyn feels ready to transition to her HEP, therefore will proceed with discharge from physical therapy for this episode.   OBJECTIVE IMPAIRMENTS: decreased activity tolerance, decreased knowledge of condition, decreased ROM, decreased strength, hypomobility, increased fascial restrictions, impaired perceived functional ability, increased muscle spasms, impaired flexibility, impaired UE functional use, improper body mechanics, postural dysfunction, and pain.   ACTIVITY  LIMITATIONS: lifting, sleeping, bathing, dressing, reach over head, and hygiene/grooming  PARTICIPATION LIMITATIONS: meal prep, cleaning, laundry, community activity, and occupation  PERSONAL FACTORS: Past/current experiences, Time since onset of injury/illness/exacerbation, and 3+ comorbidities: Osteopenia, GERD, anxiety, depression, insomnia  are also affecting patient's functional outcome.   REHAB POTENTIAL: Good  CLINICAL DECISION MAKING: Stable/uncomplicated  EVALUATION COMPLEXITY: Low   GOALS: Goals reviewed with patient? Yes  SHORT TERM GOALS: Target date: 08/28/2023   Patient will be independent with initial HEP to improve outcomes and carryover.  Baseline:  Goal status: MET - 07/20/23   2.  Patient will report 25% reduction in L shoulder pain. Baseline: Up to 4/10 with certain movements Goal status: MET - 08/29/23  3.  Patient will demonstrate improved scapulohumeral kinematics for increased L shoulder ROM. Baseline:  Goal status: MET - 08/29/23  LONG TERM GOALS: Target date: 09/11/2023   Patient will be independent with ongoing/advanced HEP for self-management at home.  Baseline:  Goal status: MET -  09/11/23   2.  Patient will report 50-75% improvement in L shoulder pain to improve QOL.  Baseline: Up to 4/10 with certain movements Goal status: MET - 08/29/23  3.  Patient to demonstrate improved upright posture with posterior shoulder girdle engaged to promote improved glenohumeral joint mobility. Baseline: fwd head, rounded shoulders, and L shoulder mildly depressed and protracted Goal status: MET - 09/11/23  4.  Patient to improve L shoulder AROM to WFL/WNL without pain provocation to allow for increased ease of ADLs.  Baseline: Refer to above UE ROM table Goal status: MET - 09/11/23  5.  Patient will demonstrate improved L shoulder strength to >/= 4+/5 for functional UE use. Baseline: Refer to above UE MMT table Goal status: PARTIALLY MET - 09/06/23 -  functional ABD still limited to 1# wt  6  Patient will report </= 8% on QuickDASH to demonstrate improved functional ability.  Baseline: 18.2 / 100 = 18.2 % Goal status: MET - 09/11/23 -  4.5 / 100 = 4.5 %  7.  Patient will resume working out at normal frequency without limitation due to L shoulder pain or LOM. Baseline: Patient has cut back on workouts to 1 time a week due to L shoulder pain Goal status: MET - 09/11/23 - pt reports she has been gradually adding weight at the gym w/o issues related to her L shoulder   PLAN:  PT FREQUENCY: 2x/week  PT DURATION: 4 weeks  PLANNED INTERVENTIONS: Therapeutic exercises, Therapeutic activity, Neuromuscular re-education, Patient/Family education, Self Care, Joint mobilization, Dry Needling, Electrical stimulation, Spinal mobilization, Cryotherapy, Moist heat, Splintting, Taping, Vasopneumatic device, Ultrasound, Ionotophoresis 4mg /ml Dexamethasone, Manual therapy, and Re-evaluation  PLAN FOR NEXT SESSION:  Transition to HEP + discharge from PT   PHYSICAL THERAPY DISCHARGE SUMMARY  Visits from Start of Care: 12  Current functional level related to goals / functional outcomes: Refer to above clinical impression and goal assessment.    Remaining deficits: As above.    Education / Equipment: HEP   Patient agrees to discharge. Patient goals were met. Patient is being discharged due to being pleased with the current functional level.  Marry Guan, PT  09/11/23 2:55 PM   [facility] at [facility] [address]  [address] [place], [place], [address] Phone: [phone]   Fax:  [phone]

## 2023-09-11 NOTE — Telephone Encounter (Signed)
Pt has reviewed via MyChart

## 2023-09-11 NOTE — Telephone Encounter (Signed)
-----   Message from Neena Rhymes sent at 09/10/2023 10:52 AM EST ----- Ultrasound is normal- this is great news!

## 2023-09-18 ENCOUNTER — Encounter: Payer: 59 | Admitting: Physical Therapy

## 2023-09-26 ENCOUNTER — Other Ambulatory Visit: Payer: Self-pay | Admitting: Family Medicine

## 2023-12-20 ENCOUNTER — Other Ambulatory Visit: Payer: Self-pay | Admitting: Family Medicine

## 2023-12-21 NOTE — Telephone Encounter (Signed)
 Requested Prescriptions   Pending Prescriptions Disp Refills   traZODone (DESYREL) 50 MG tablet [Pharmacy Med Name: traZODone HCl 50 MG Oral Tablet] 90 tablet 3    Sig: TAKE 1/2 TO 1 TABLET BY MOUTH AT BEDTIME AS NEEDED FOR SLEEP     Date of patient request: 12/21/2023 Last office visit: 09/06/2023 Upcoming visit: 03/06/2024 Date of last refill: 01/17/2023 Last refill amount: 90

## 2024-02-19 ENCOUNTER — Other Ambulatory Visit: Payer: Self-pay

## 2024-03-06 ENCOUNTER — Encounter: Payer: Self-pay | Admitting: Family Medicine

## 2024-03-06 ENCOUNTER — Ambulatory Visit: Payer: 59 | Admitting: Family Medicine

## 2024-03-06 ENCOUNTER — Ambulatory Visit: Payer: Self-pay | Admitting: Family Medicine

## 2024-03-06 VITALS — BP 104/64 | HR 59 | Temp 97.9°F | Ht 65.5 in | Wt 164.0 lb

## 2024-03-06 DIAGNOSIS — E785 Hyperlipidemia, unspecified: Secondary | ICD-10-CM

## 2024-03-06 DIAGNOSIS — E663 Overweight: Secondary | ICD-10-CM | POA: Diagnosis not present

## 2024-03-06 DIAGNOSIS — R5383 Other fatigue: Secondary | ICD-10-CM | POA: Diagnosis not present

## 2024-03-06 DIAGNOSIS — Z23 Encounter for immunization: Secondary | ICD-10-CM | POA: Diagnosis not present

## 2024-03-06 LAB — CBC WITH DIFFERENTIAL/PLATELET
Basophils Absolute: 0 10*3/uL (ref 0.0–0.1)
Basophils Relative: 0.9 % (ref 0.0–3.0)
Eosinophils Absolute: 0.1 10*3/uL (ref 0.0–0.7)
Eosinophils Relative: 2.6 % (ref 0.0–5.0)
HCT: 40.1 % (ref 36.0–46.0)
Hemoglobin: 13.5 g/dL (ref 12.0–15.0)
Lymphocytes Relative: 44.1 % (ref 12.0–46.0)
Lymphs Abs: 1.4 10*3/uL (ref 0.7–4.0)
MCHC: 33.6 g/dL (ref 30.0–36.0)
MCV: 83.2 fl (ref 78.0–100.0)
Monocytes Absolute: 0.3 10*3/uL (ref 0.1–1.0)
Monocytes Relative: 9 % (ref 3.0–12.0)
Neutro Abs: 1.4 10*3/uL (ref 1.4–7.7)
Neutrophils Relative %: 43.4 % (ref 43.0–77.0)
Platelets: 148 10*3/uL — ABNORMAL LOW (ref 150.0–400.0)
RBC: 4.82 Mil/uL (ref 3.87–5.11)
RDW: 15.2 % (ref 11.5–15.5)
WBC: 3.3 10*3/uL — ABNORMAL LOW (ref 4.0–10.5)

## 2024-03-06 LAB — HEPATIC FUNCTION PANEL
ALT: 11 U/L (ref 0–35)
AST: 14 U/L (ref 0–37)
Albumin: 4.2 g/dL (ref 3.5–5.2)
Alkaline Phosphatase: 47 U/L (ref 39–117)
Bilirubin, Direct: 0.3 mg/dL (ref 0.0–0.3)
Total Bilirubin: 1.5 mg/dL — ABNORMAL HIGH (ref 0.2–1.2)
Total Protein: 6.5 g/dL (ref 6.0–8.3)

## 2024-03-06 LAB — BASIC METABOLIC PANEL WITH GFR
BUN: 15 mg/dL (ref 6–23)
CO2: 26 meq/L (ref 19–32)
Calcium: 9.2 mg/dL (ref 8.4–10.5)
Chloride: 107 meq/L (ref 96–112)
Creatinine, Ser: 0.93 mg/dL (ref 0.40–1.20)
GFR: 65.75 mL/min (ref >60.00)
Glucose, Bld: 77 mg/dL (ref 70–99)
Potassium: 4 meq/L (ref 3.5–5.1)
Sodium: 140 meq/L (ref 135–145)

## 2024-03-06 LAB — LIPID PANEL
Cholesterol: 196 mg/dL (ref 0–200)
HDL: 95.5 mg/dL (ref >39.00)
LDL Cholesterol: 92 mg/dL (ref 0–99)
NonHDL: 100
Total CHOL/HDL Ratio: 2
Triglycerides: 40 mg/dL (ref 0.0–149.0)
VLDL: 8 mg/dL (ref 0.0–40.0)

## 2024-03-06 LAB — B12 AND FOLATE PANEL
Folate: >23.2 ng/mL (ref >5.9)
Vitamin B-12: 1333 pg/mL — ABNORMAL HIGH (ref 211–911)

## 2024-03-06 LAB — IBC + FERRITIN
Ferritin: 83.3 ng/mL (ref 10.0–291.0)
Iron: 82 ug/dL (ref 42–145)
Saturation Ratios: 25.9 % (ref 20.0–50.0)
TIBC: 316.4 ug/dL (ref 250.0–450.0)
Transferrin: 226 mg/dL (ref 212.0–360.0)

## 2024-03-06 LAB — TSH: TSH: 0.99 u[IU]/mL (ref 0.35–5.50)

## 2024-03-06 LAB — VITAMIN D 25 HYDROXY (VIT D DEFICIENCY, FRACTURES): VITD: 75.78 ng/mL (ref 30.00–100.00)

## 2024-03-06 NOTE — Assessment & Plan Note (Signed)
 Chronic problem.  On Lipitor 10mg  daily w/o difficulty.  Check labs.  Adjust meds prn

## 2024-03-06 NOTE — Patient Instructions (Signed)
 Schedule your complete physical in 6 months We'll notify you of your lab results and make any changes if needed Continue to work on healthy diet and regular exercise- you can do it! Call with any questions or concerns Stay Safe!  Stay Healthy! Have a great summer!!! 

## 2024-03-06 NOTE — Progress Notes (Signed)
   Subjective:    Patient ID: Courtney Calhoun, female    DOB: Aug 05, 1961, 63 y.o.   MRN: [id number]  HPI Hyperlipidemia- chronic problem, on Lipitor 10mg  daily.  No CP, SOB, abd pain, N/V.  Fatigue- pt reports sxs daily.  'i'm just tired'.  Not sleeping well at night.  Difficulty stay asleep rather than falling asleep.  Denies snoring or breathing pauses.    Overweight- pt has gained 7 lbs since last visit.  BMI now 26.88.  pt reports 'exercising when I can, but I'm tired'.  Not following any particular diet.     Review of Systems For ROS see HPI     Objective:   Physical Exam Vitals reviewed.  Constitutional:      General: She is not in acute distress.    Appearance: Normal appearance. She is well-developed. She is not ill-appearing.  HENT:     Head: Normocephalic and atraumatic.  Eyes:     Conjunctiva/sclera: Conjunctivae normal.     Pupils: Pupils are equal, round, and reactive to light.  Neck:     Thyroid : No thyromegaly.  Cardiovascular:     Rate and Rhythm: Normal rate and regular rhythm.     Pulses: Normal pulses.     Heart sounds: Normal heart sounds. No murmur heard. Pulmonary:     Effort: Pulmonary effort is normal. No respiratory distress.     Breath sounds: Normal breath sounds.  Abdominal:     General: There is no distension.     Palpations: Abdomen is soft.     Tenderness: There is no abdominal tenderness.  Musculoskeletal:     Cervical back: Normal range of motion and neck supple.     Right lower leg: No edema.     Left lower leg: No edema.  Lymphadenopathy:     Cervical: No cervical adenopathy.  Skin:    General: Skin is warm and dry.  Neurological:     General: No focal deficit present.     Mental Status: She is alert and oriented to person, place, and time.  Psychiatric:        Mood and Affect: Mood normal.        Behavior: Behavior normal.        Thought Content: Thought content normal.           Assessment & Plan:  Fatigue- new.  Pt  reports being tired all the time.  Having trouble staying asleep.  Admits to poor diet and no regular exercise.  Encouraged healthy food choices and physical activity to improve sleep.  Check labs to look for underlying metabolic cause of fatigue.  Will follow.

## 2024-03-06 NOTE — Assessment & Plan Note (Signed)
 Deteriorated.  Pt has gained 7 lbs since last visit.  Encouraged healthy diet and regular physical exercise.  Check labs to risk stratify.  Will follow.

## 2024-03-12 ENCOUNTER — Ambulatory Visit: Admitting: Family Medicine

## 2024-04-08 ENCOUNTER — Ambulatory Visit: Admitting: Family Medicine

## 2024-04-08 ENCOUNTER — Encounter: Payer: Self-pay | Admitting: Family Medicine

## 2024-04-08 ENCOUNTER — Other Ambulatory Visit: Payer: Self-pay

## 2024-04-08 VITALS — BP 124/86 | HR 53 | Ht 65.5 in | Wt 167.0 lb

## 2024-04-08 DIAGNOSIS — M25512 Pain in left shoulder: Secondary | ICD-10-CM | POA: Diagnosis not present

## 2024-04-08 DIAGNOSIS — G8929 Other chronic pain: Secondary | ICD-10-CM | POA: Diagnosis not present

## 2024-04-08 NOTE — Progress Notes (Signed)
   I, Leotis Batter, CMA acting as a scribe for Artist Lloyd, MD.  Courtney Calhoun is a 63 y.o. female who presents to [facility] today for f/u L shoulder pain. Pt was last seen by Dr. Lloyd on 08/21/23 and was given a L subacromial steroid injection and cont HEP/PT  Today, pt reports exacerbation of left shoulder sx since May. Sx worse with lateral movements. Denies n/t/w, decreased grip strength. Locates pain to lateral aspect of the shoulder, waxing and waning. Sharper pain compared to last night. Using BioFreeze with short-term relief.   Dx imaging: 06/14/23 L shoulder XR   Pertinent review of systems: No fevers or chills  Relevant historical information: Osteopenia.    Exam:  BP 124/86   Pulse (!) 53   Ht 5' 5.5 (1.664 m)   Wt 167 lb (75.8 kg)   SpO2 97%   BMI 27.37 kg/m  General: Well Developed, well nourished, and in no acute distress.   MSK: Left shoulder: Normal-appearing Normal motion pain with abduction intact strength positive Hawkins and Neer's test.   Lab and Radiology Results  Procedure: Real-time Ultrasound Guided Injection of left shoulder subacromial bursa Device: Philips Affiniti 50G/GE Logiq Images permanently stored and available for review in PACS Verbal informed consent obtained.  Discussed risks and benefits of procedure. Warned about infection, bleeding, hyperglycemia damage to structures among others. Patient expresses understanding and agreement Time-out conducted.   Noted no overlying erythema, induration, or other signs of local infection.   Skin prepped in a sterile fashion.   Local anesthesia: Topical Ethyl chloride.   With sterile technique and under real time ultrasound guidance: 40 mg of Kenalog and 2 mL of Marcaine injected into subacromial bursa. Fluid seen entering the bursa.   Completed without difficulty   Pain immediately resolved suggesting accurate placement of the medication.   Advised to call if  fevers/chills, erythema, induration, drainage, or persistent bleeding.   Images permanently stored and available for review in the ultrasound unit.  Impression: Technically successful ultrasound guided injection.         Assessment and Plan: 63 y.o. female with left shoulder pain due to subacromial bursitis.  This is a chronic ongoing issue.  She previously had a subacromial injection left shoulder November 2024.  Plan for repeat injection today.  Continue home exercise program previously taught by physical therapy.  Check back as needed.   PDMP not reviewed this encounter. Orders Placed This Encounter  Procedures   US  LIMITED JOINT SPACE STRUCTURES UP LEFT(NO LINKED CHARGES)    Reason for Exam (SYMPTOM  OR DIAGNOSIS REQUIRED):   left shoulder pain    Preferred imaging location?:   [facility]   No orders of the defined types were placed in this encounter.    Discussed warning signs or symptoms. Please see discharge instructions. Patient expresses understanding.   The above documentation has been reviewed and is accurate and complete Artist Lloyd, M.D.

## 2024-04-08 NOTE — Patient Instructions (Addendum)
 Thank you for coming in today.   You received an injection today. Seek immediate medical attention if the joint becomes red, extremely painful, or is oozing fluid.   Continue home exercises.   See you back as needed.

## 2024-04-23 ENCOUNTER — Other Ambulatory Visit: Payer: Self-pay | Admitting: Family Medicine

## 2024-05-06 ENCOUNTER — Encounter: Payer: Self-pay | Admitting: Family Medicine

## 2024-05-06 ENCOUNTER — Ambulatory Visit: Admitting: Family Medicine

## 2024-05-06 ENCOUNTER — Ambulatory Visit: Payer: Self-pay

## 2024-05-06 VITALS — BP 112/72 | HR 64 | Temp 98.7°F | Ht 65.5 in | Wt 166.5 lb

## 2024-05-06 DIAGNOSIS — R350 Frequency of micturition: Secondary | ICD-10-CM

## 2024-05-06 DIAGNOSIS — R1084 Generalized abdominal pain: Secondary | ICD-10-CM

## 2024-05-06 LAB — POCT URINALYSIS DIPSTICK
Bilirubin, UA: NEGATIVE
Blood, UA: NEGATIVE
Clarity, UA: NEGATIVE
Glucose, UA: NEGATIVE
Ketones, UA: NEGATIVE
Leukocytes, UA: NEGATIVE
Nitrite, UA: NEGATIVE
Protein, UA: NEGATIVE
Spec Grav, UA: 1.025 (ref 1.010–1.025)
Urobilinogen, UA: 0.2 U/dL
pH, UA: 6 (ref 5.0–8.0)

## 2024-05-06 NOTE — Telephone Encounter (Signed)
 Reason for Disposition  [1] MILD pain (e.g., does not interfere with normal activities) AND [2] pain comes and goes (cramps) AND [3] present > 48 hours  (Exception: This same abdominal pain is a chronic symptom recurrent or ongoing AND present > 4 weeks.)  Answer Assessment - Initial Assessment Questions 1. LOCATION: Where does it hurt?      Lower abdomen  2. RADIATION: Does the pain shoot anywhere else? (e.g., chest, back)     no 3. ONSET: When did the pain begin? (e.g., minutes, hours or days ago)      1 week ago  4. SUDDEN: Gradual or sudden onset?     Gradually  5. PATTERN Does the pain come and go, or is it constant?     Comes and goes  6. SEVERITY: How bad is the pain?  (e.g., Scale 1-10; mild, moderate, or severe)     Cramps 3/10       8. CAUSE: What do you think is causing the stomach pain? (e.g., gallstones, recent abdominal surgery)     Possible Meloxicam  side effect  10. OTHER SYMPTOMS: Do you have any other symptoms? (e.g., back pain, diarrhea, fever, urination pain, vomiting)       Started with back pain helped with Meloxicam , gas- has chronic back pain that has improved  Protocols used: Abdominal Pain - [facility]

## 2024-05-06 NOTE — Patient Instructions (Signed)
 Follow up as needed or as scheduled I think your abdominal issues were related to the anti-inflammatory medication Try and avoid the Meloxicam  or other anti-inflammatory medications- particularly at high doses USE Acetaminophen (Tylenol) as needed HEAT for muscle spasm and tightness, ICE for aching and soreness Call with any questions or concerns Hang in there!! HAPPY EARLY BIRTHDAY!!!!

## 2024-05-06 NOTE — Telephone Encounter (Signed)
 FYI

## 2024-05-06 NOTE — Progress Notes (Unsigned)
   Subjective:    Patient ID: Courtney Calhoun, female    DOB: 05-Sep-1961, 63 y.o.   MRN: [id number]  HPI Abd pain- pt reports she strained her back and took leftover Meloxicam  last week.  The 2nd day of Meloxicam  she developed increased gas/bloating and episodic cramping pain.  Pt reports pain/discomfort is better today than it has been.  BM's are normal- no constipation or diarrhea.  No N/V.  No TTP.  No change in appetite.     Review of Systems For ROS see HPI     Objective:   Physical Exam Vitals reviewed.  Constitutional:      General: She is not in acute distress.    Appearance: Normal appearance. She is well-developed. She is not ill-appearing.  HENT:     Head: Normocephalic and atraumatic.  Eyes:     Extraocular Movements: Extraocular movements intact.     Conjunctiva/sclera: Conjunctivae normal.  Cardiovascular:     Rate and Rhythm: Normal rate and regular rhythm.  Pulmonary:     Effort: Pulmonary effort is normal. No respiratory distress.  Abdominal:     General: Bowel sounds are normal. There is no distension.     Tenderness: There is no abdominal tenderness. There is no guarding or rebound.  Skin:    General: Skin is warm and dry.  Neurological:     General: No focal deficit present.     Mental Status: She is alert and oriented to person, place, and time.  Psychiatric:        Mood and Affect: Mood normal.        Behavior: Behavior normal.           Assessment & Plan:  Abd pain- new.  Pt reports sxs are resolving and occurred after taking leftover Meloxicam .  She has a hx of GI intolerance to NSAIDs.  Reviewed this w/ her and encouraged her to take Acetaminophen instead.  UA unremarkable.  PE WNL.  No further workup at this time.  Pt expressed understanding and is in agreement w/ plan.

## 2024-05-15 ENCOUNTER — Encounter: Payer: Self-pay | Admitting: Dietician

## 2024-05-15 ENCOUNTER — Encounter: Attending: Family Medicine | Admitting: Dietician

## 2024-05-15 VITALS — Wt 167.8 lb

## 2024-05-15 DIAGNOSIS — E785 Hyperlipidemia, unspecified: Secondary | ICD-10-CM | POA: Insufficient documentation

## 2024-05-15 NOTE — Progress Notes (Signed)
 Medical Nutrition Therapy  Appointment Start time:  1515  Appointment End time:  1600  Primary concerns today: overall health   Referral diagnosis: Employee Visit 1, no referral diagnosis. Preferred learning style: no preference indicated Learning readiness: ready   NUTRITION ASSESSMENT   Anthropometrics   Wt 05/15/24: 167.8 lb  Clinical Medical Hx: allergies, anemia, anxiety, depression, glaucoma, HLD Medications: reviewed Labs: reviewed Notable Signs/Symptoms: some recent gas/bloating Food Allergies: none  Lifestyle & Dietary Hx  Pt reports she feels she doesn't eat a lot of fruits and vegetables. Pt reports she also feels she needs to work on water intake. Pt states she is currently drinking around 32 oz water daily but trying to aim for 40 oz.   Pt reports she wakes up around 6am, works until 12:30pm, part time. Pt states if she has a late lunch she may not eat dinner. Pt reports eating out around 2 times per week. Pt states she tries not to eat after 8pm.   Pt states she goes walking with her niece at the park 1x/wk. Pt reports she used to work out 3-4 times a week but hurt her rotator cuff and back, but is now feeling like she is ready to start going back to the gym. Pt reports they walk for 10 minutes around the parking lot at work sometimes.   Pt states she feels she is picky about vegetables with accepted vegetables being cooked carrots, collard greens, romaine/lettuce, and small amounts of cooked peppers/onions.    Estimated daily fluid intake: 24 oz Supplements: probiotic, omega 3, vitamin d , MVI Sleep: 5.5 hours Stress / self-care: moderate stress;  Current average weekly physical activity: walking with niece 1x/wk  24-Hr Dietary Recall First Meal: 7am: scrambled egg and juice or Boost shake OR cereal and sausage/bacon Snack: 9/10am: donuts OR trail mix OR chips Second Meal: 1:30pm: soup OR hot dog and fries Snack: none Third Meal: spaghetti Snack:  none Beverages: water, 8 oz juice   NUTRITION DIAGNOSIS  NB-1.1 Food and nutrition-related knowledge deficit As related to lack of prior education by a registered dietitian.  As evidenced by pt report.   NUTRITION INTERVENTION  Nutrition education (E-1) on the following topics:   Trying New Foods Trying a wide variety of vegetables is crucial not just for nutritional diversity but also for expanding your palate, though many people assume that one or two tries are enough, research suggests our tastes often need time to warm up to unfamiliar foods; we need around eight to ten neutral exposures to a new vegetable before acceptance increases, with some estimates reaching as many as twenty or more repetitions to shift perception.  Plate Method/Food Groups Fruits & Vegetables: Aim to fill half your plate with a variety of fruits and vegetables. They are rich in vitamins, minerals, and fiber, and can help reduce the risk of chronic diseases. Choose a colorful assortment of fruits and vegetables to ensure you get a wide range of nutrients. Grains and Starches: Make at least half of your grain choices whole grains, such as brown rice, whole wheat bread, and oats. Whole grains provide fiber, which aids in digestion and healthy cholesterol levels. Aim for whole forms of starchy vegetables such as potatoes, sweet potatoes, beans, peas, and corn, which are fiber rich and provide many vitamins and minerals.  Protein: Incorporate lean sources of protein, such as poultry, fish, beans, nuts, and seeds, into your meals. Protein is essential for building and repairing tissues, staying full, balancing blood sugar,  as well as supporting immune function. Dairy: Include low-fat or fat-free dairy products like milk, yogurt, and cheese in your diet. Dairy foods are excellent sources of calcium  and vitamin D , which are crucial for bone health.   Handouts Provided Include  Non-starchy vegetable list Plate  Method  Learning Style & Readiness for Change Teaching method utilized: Visual & Auditory  Demonstrated degree of understanding via: Teach Back  Barriers to learning/adherence to lifestyle change: none  Goals Established by Pt  Goal 1: get back into the gym 2 times a week.   Goal 2: before next visit try broccoli and cucumber.   Goal 3: aim for 40 oz water daily.    MONITORING & EVALUATION Dietary intake, weekly physical activity, and follow up in 2 months.  Next Steps  Patient is to call for questions.

## 2024-05-15 NOTE — Patient Instructions (Signed)
 Goals Established by Pt  Goal 1: get back into the gym 2 times a week.   Goal 2: before next visit try broccoli and cucumber.   Goal 3: aim for 40 oz water daily.

## 2024-06-18 ENCOUNTER — Other Ambulatory Visit: Payer: Self-pay

## 2024-06-18 ENCOUNTER — Encounter (HOSPITAL_BASED_OUTPATIENT_CLINIC_OR_DEPARTMENT_OTHER): Payer: Self-pay | Admitting: Emergency Medicine

## 2024-06-18 ENCOUNTER — Emergency Department (HOSPITAL_BASED_OUTPATIENT_CLINIC_OR_DEPARTMENT_OTHER)
Admission: EM | Admit: 2024-06-18 | Discharge: 2024-06-18 | Disposition: A | Discharge status: Home / Self Care | Attending: Emergency Medicine | Admitting: Emergency Medicine

## 2024-06-18 DIAGNOSIS — I10 Essential (primary) hypertension: Secondary | ICD-10-CM | POA: Diagnosis present

## 2024-06-18 NOTE — ED Provider Notes (Signed)
 [facility] Provider Note   CSN: [id number] Arrival date & time: 06/18/24  2216     Patient presents with: Hypertension   Courtney Calhoun is a 63 y.o. female.   Patient is a 63 year old female with history of hyperlipidemia.  Patient presenting today with complaints of elevated blood pressure.  She checked her blood pressure at home and it was 160/100, so presents for evaluation.  She is experiencing no other symptoms.  She denies to me she is having any headache, chest pain, difficulty breathing, weakness, numbness, or visual disturbances.  No fevers or chills.       Prior to Admission medications   Medication Sig Start Date End Date Taking? Authorizing Provider  ALLERGY RELIEF 10 MG tablet TAKE 1 TABLET BY MOUTH DAILY 07/28/23   Courtney Calhoun, Courtney E, MD  atorvastatin  (LIPITOR) 10 MG tablet TAKE 1 TABLET BY MOUTH DAILY 09/28/23   Courtney Calhoun, Courtney E, MD  Cholecalciferol 25 MCG (1000 UT) tablet Take by mouth.    [provider]  fluticasone  (FLONASE ) 50 MCG/ACT nasal spray Place 2 sprays into both nostrils daily. 09/06/23   Courtney Calhoun, Courtney E, MD  latanoprost (XALATAN) 0.005 % ophthalmic solution Place 1 drop into both eyes at bedtime. 12/08/12   [provider]  Omega-3 Fatty Acids (FISH OIL ADULT GUMMIES PO) Take by mouth.    [provider]  omeprazole  (PRILOSEC) 20 MG capsule TAKE 1 CAPSULE BY MOUTH EVERY DAY 04/23/24   Courtney Calhoun, Courtney E, MD  Probiotic Product (CULTURELLE PROBIOTICS PO) Take by mouth.    [provider]  traZODone  (DESYREL ) 50 MG tablet TAKE 1/2 TO 1 TABLET BY MOUTH AT BEDTIME AS NEEDED FOR SLEEP 12/21/23   Courtney Calhoun, Courtney E, MD    Allergies: Aspirin, Ibuprofen, and Nsaids    Review of Systems  All other systems reviewed and are negative.   Updated Vital Signs BP (!) 160/102 (BP Location: Left Arm)   Pulse 65   Temp 98 F (36.7 C) (Oral)   Resp 16   Ht 5' 6 (1.676 m)    Wt 74.4 kg   SpO2 99%   BMI 26.47 kg/m   Physical Exam Vitals and nursing note reviewed.  Constitutional:      General: She is not in acute distress.    Appearance: She is well-developed. She is not diaphoretic.  HENT:     Head: Normocephalic and atraumatic.  Eyes:     Extraocular Movements: Extraocular movements intact.     Pupils: Pupils are equal, round, and reactive to light.  Cardiovascular:     Rate and Rhythm: Normal rate and regular rhythm.     Heart sounds: No murmur heard.    No friction rub. No gallop.  Pulmonary:     Effort: Pulmonary effort is normal. No respiratory distress.     Breath sounds: Normal breath sounds. No wheezing.  Abdominal:     General: Bowel sounds are normal. There is no distension.     Palpations: Abdomen is soft.     Tenderness: There is no abdominal tenderness.  Musculoskeletal:        General: Normal range of motion.     Cervical back: Normal range of motion and neck supple.  Skin:    General: Skin is warm and dry.  Neurological:     General: No focal deficit present.     Mental Status: She is alert and oriented to person, place, and time.  Cranial Nerves: No cranial nerve deficit.     Motor: No weakness.     (all labs ordered are listed, but only abnormal results are displayed) Labs Reviewed - No data to display  EKG: None  Radiology: No results found.   Procedures   Medications Ordered in the ED - No data to display                                  Medical Decision Making  Patient presenting with elevated blood pressure as described in the HPI.  She has no symptoms otherwise.  Her blood pressure here is 160/100 and patient is clinically well-appearing.  At this point, I feel as though she can safely be discharged.  I will have patient follow-up with her doctor on Thursday.     Final diagnoses:  None    ED Discharge Orders     None          Geroldine Berg, MD 06/18/24 2328

## 2024-06-18 NOTE — Discharge Instructions (Signed)
 Keep a record of your blood pressures over the next 2 days and follow-up with your primary doctor on Thursday as scheduled.  Return to the ER if you experience any new and/or concerning issues.

## 2024-06-18 NOTE — ED Triage Notes (Signed)
 Pt reports elevated bp x 1 day.   Blurred vision yesterday, denies today.  Denies cp, shob, dizziness. Denies htn hx.

## 2024-06-19 ENCOUNTER — Ambulatory Visit: Payer: Self-pay

## 2024-06-19 NOTE — Telephone Encounter (Signed)
 Patient was in ER yesterday for elevated BP, she reports no interventions, advised to follow up with pcp. BP has returned to normal and she is asymptomatic.    Patient has appt with you tomorrow 1:20pm. It is scheduled as an acute visit. She was seen in ED yesterday, I think it needs to be changed. I do not see a hospital f/u opening on your schedule tomorrow. Want to move her to another day?

## 2024-06-19 NOTE — Telephone Encounter (Signed)
 FYI Only or Action Required?: FYI only for provider.  Patient was last seen in primary care on 05/06/2024 by Mahlon Comer BRAVO, MD.  Called Nurse Triage reporting Hypertension.  Symptoms began yesterday.  Interventions attempted: Rest, hydration, or home remedies.  Symptoms are: rapidly improving.  Triage Disposition: See PCP Within 2 Weeks  Patient/caregiver understands and will follow disposition?: Yes     Reason for Disposition  Patient wants doctor (or NP/PA) to measure BP  Answer Assessment - Initial Assessment Questions Additional info:  Patient self scheduled. E2C2 received call to outreach to patient for triage.  Patient was in ER yesterday for elevated BP, she reports no interventions, advised to follow up with pcp. BP has returned to normal and she is asymptomatic.   1. BLOOD PRESSURE: What is your blood pressure? Did you take at least two measurements 5 minutes apart?     120/84 2. ONSET: When did you take your blood pressure?     This morning 3. HOW: How did you take your blood pressure? (e.g., automatic home BP monitor, visiting nurse)     Home cuff 4. HISTORY: Do you have a history of high blood pressure?     yes 5. MEDICINES: Are you taking any medicines for blood pressure? Have you missed any doses recently?     As prescribed by pcp  6. OTHER SYMPTOMS: Do you have any symptoms? (e.g., blurred vision, chest pain, difficulty breathing, headache, weakness)     Denies symptoms at this time.  Protocols used: Blood Pressure - High-A-AH

## 2024-06-20 ENCOUNTER — Encounter: Payer: Self-pay | Admitting: Family Medicine

## 2024-06-20 ENCOUNTER — Ambulatory Visit: Admitting: Family Medicine

## 2024-06-20 VITALS — BP 132/72 | HR 62 | Temp 98.0°F | Wt 165.2 lb

## 2024-06-20 DIAGNOSIS — R0981 Nasal congestion: Secondary | ICD-10-CM

## 2024-06-20 DIAGNOSIS — R030 Elevated blood-pressure reading, without diagnosis of hypertension: Secondary | ICD-10-CM | POA: Diagnosis not present

## 2024-06-20 LAB — POC COVID19 BINAXNOW: SARS Coronavirus 2 Ag: NEGATIVE

## 2024-06-20 MED ORDER — AZELASTINE HCL 0.1 % NA SOLN: 1.0000 | Freq: Two times a day (BID) | NASAL | 5 refills | Status: AC

## 2024-06-20 NOTE — Telephone Encounter (Signed)
 Ok to leave as is- but thank you for checking

## 2024-06-20 NOTE — Progress Notes (Signed)
   Subjective:    Patient ID: Holli CINDERELLA Pinal, female    DOB: 04/30/1961, 63 y.o.   MRN: [id number]  HPI URI- sxs started Tuesday w/ pressure behind her eyes.  + nasal congestion.  No fever.  No cough.  No ear pain.  Currently taking Claritin  and using Flonase .  Elevated BP- pt reports BP was elevated for a few days.  Actually went to ER on 9/16.  Notes reviewed.  Denies change in medication.  Denies CP, SOB, HA's, visual changes.  BP is normal today and she reports she is no longer concerned   Review of Systems For ROS see HPI     Objective:   Physical Exam Vitals reviewed.  Constitutional:      General: She is not in acute distress.    Appearance: Normal appearance. She is not ill-appearing.  HENT:     Head: Normocephalic and atraumatic.     Right Ear: Tympanic membrane is retracted.     Left Ear: Tympanic membrane normal.     Nose: Congestion present.     Right Turbinates: Swollen.     Left Turbinates: Swollen.     Right Sinus: No maxillary sinus tenderness or frontal sinus tenderness.     Left Sinus: No maxillary sinus tenderness or frontal sinus tenderness.  Cardiovascular:     Rate and Rhythm: Normal rate and regular rhythm.     Pulses: Normal pulses.     Heart sounds: No murmur heard. Pulmonary:     Effort: Pulmonary effort is normal. No respiratory distress.     Breath sounds: No wheezing or rhonchi.  Musculoskeletal:     Cervical back: Neck supple.     Right lower leg: No edema.     Left lower leg: No edema.  Lymphadenopathy:     Cervical: No cervical adenopathy.  Skin:    General: Skin is warm and dry.  Neurological:     General: No focal deficit present.     Mental Status: She is alert and oriented to person, place, and time.  Psychiatric:        Mood and Affect: Mood normal.        Behavior: Behavior normal.        Thought Content: Thought content normal.           Assessment & Plan:  Nasal congestion- new.  No evidence of bacterial infxn on PE.   Suspect allergy inflammation.  Encouraged her to continue daily antihistamine, continue Flonase , add Azelastine .  Reviewed supportive care and red flags that should prompt return.  Pt expressed understanding and is in agreement w/ plan.   Elevated BP- pt's home reading yesterday was WNL.  Today in office WNL.  Currently asymptomatic.  Unclear as to what caused elevated BP but will have pt continue to check BP at home and if consistently >140/90 will have her reach out.  Pt expressed understanding and is in agreement w/ plan.

## 2024-06-20 NOTE — Patient Instructions (Signed)
 Follow up as needed or as scheduled Continue to monitor your blood pressure at home periodically (every few days) If regularly higher than 140 on the top or 90 on the bottom- please let me know Continue to work on healthy diet and regular exercise- you look great AND it will help your BP START the Azelastine  nasal spray to help w/ congestion CONTINUE your daily allergy medication Call with any questions or concerns Hang in there!!!

## 2024-07-11 ENCOUNTER — Other Ambulatory Visit (HOSPITAL_COMMUNITY): Payer: Self-pay

## 2024-07-11 MED ORDER — ZOSTER VAC RECOMB ADJUVANTED 50 MCG/0.5ML IM SUSR
0.5000 mL | Freq: Once | INTRAMUSCULAR | 0 refills | Status: AC
  Filled 2024-07-11: qty 0.5, 1d supply, fill #0

## 2024-07-17 ENCOUNTER — Encounter: Attending: Family Medicine | Admitting: Dietician

## 2024-07-17 ENCOUNTER — Encounter: Payer: Self-pay | Admitting: Dietician

## 2024-07-17 DIAGNOSIS — E785 Hyperlipidemia, unspecified: Secondary | ICD-10-CM | POA: Insufficient documentation

## 2024-07-17 NOTE — Progress Notes (Signed)
 Medical Nutrition Therapy  Appointment Start time:  1525  Appointment End time:  1545  This is a virtual visit.  Patient location: home Provider location: office  Primary concerns today: overall health   Referral diagnosis: Employee Visit 2, no referral diagnosis. Preferred learning style: no preference indicated Learning readiness: ready   NUTRITION ASSESSMENT   Anthropometrics   Wt 05/15/24: 167.8 lb  Clinical Medical Hx: allergies, anemia, anxiety, depression, glaucoma, HLD Medications: reviewed Labs: reviewed Notable Signs/Symptoms: some recent gas/bloating Food Allergies: none  Lifestyle & Dietary Hx  Pt reports she has been working toward adding more fruits and vegetables to her diet. Pt states she tries to have a fruit with breakfast and a veggie at lunch and dinner if possible.   Pt reports she tried cucumber and broccoli. Pt states she liked the cucumber if she has ranch, but did not like the broccoli. Pt reports she has added in zucchini.   Pt reports she is back to going to the gym and averages 1-2 days per week.   Pt states she has a hard time with water intake because she likes ice water but feels too cold at work.   Accepted non-starchy vegetables: cooked carrots, collard greens, romaine/lettuce, green beans, zucchini, and small amounts of cooked peppers/onions.   Estimated daily fluid intake: 32 oz Supplements: probiotic, omega 3, vitamin d , MVI Sleep: 5.5 hours Stress / self-care: moderate stress;  Current average weekly physical activity: gym 1-2 times per week, walking with niece 1x/wk  24-Hr Dietary Recall First Meal: 7am: cereal, bacon, fruit cup Snack: 9/10am: grapes OR chips Second Meal: 1:30pm: chicken salad OR [employer] chicken and rice Snack: none Third Meal: meat loaf, mashed potato, green beans Snack: none Beverages: water, 8 oz juice   NUTRITION DIAGNOSIS  NB-1.1 Food and nutrition-related knowledge deficit As related to lack of  prior education by a registered dietitian.  As evidenced by pt report.   NUTRITION INTERVENTION  Nutrition education (E-1) on the following topics:   Trying New Foods Trying a wide variety of vegetables is crucial not just for nutritional diversity but also for expanding your palate, though many people assume that one or two tries are enough, research suggests our tastes often need time to warm up to unfamiliar foods; we need around eight to ten neutral exposures to a new vegetable before acceptance increases, with some estimates reaching as many as twenty or more repetitions to shift perception.  Plate Method/Food Groups Fruits & Vegetables: Aim to fill half your plate with a variety of fruits and vegetables. They are rich in vitamins, minerals, and fiber, and can help reduce the risk of chronic diseases. Choose a colorful assortment of fruits and vegetables to ensure you get a wide range of nutrients. Grains and Starches: Make at least half of your grain choices whole grains, such as brown rice, whole wheat bread, and oats. Whole grains provide fiber, which aids in digestion and healthy cholesterol levels. Aim for whole forms of starchy vegetables such as potatoes, sweet potatoes, beans, peas, and corn, which are fiber rich and provide many vitamins and minerals.  Protein: Incorporate lean sources of protein, such as poultry, fish, beans, nuts, and seeds, into your meals. Protein is essential for building and repairing tissues, staying full, balancing blood sugar, as well as supporting immune function. Dairy: Include low-fat or fat-free dairy products like milk, yogurt, and cheese in your diet. Dairy foods are excellent sources of calcium  and vitamin D , which are crucial for  bone health.   Handouts Provided Include (initial assessment) Non-starchy vegetable list Plate Method  Learning Style & Readiness for Change Teaching method utilized: Visual & Auditory  Demonstrated degree of understanding  via: Teach Back  Barriers to learning/adherence to lifestyle change: none  Goals Established by Pt  Goal 1: get back into the gym 2 times a week. - goal in progress, continue!!  Goal 2: before next visit try broccoli and cucumber. - goal met! Good job!  Goal 3: aim for 40 oz water daily. - goal in progress, continue!!   MONITORING & EVALUATION Dietary intake, weekly physical activity, and follow up PRN.   Next Steps  Patient is to call for questions.

## 2024-08-02 LAB — HM MAMMOGRAPHY

## 2024-08-05 ENCOUNTER — Encounter: Payer: Self-pay | Admitting: Family Medicine

## 2024-08-13 ENCOUNTER — Other Ambulatory Visit: Payer: Self-pay | Admitting: Family Medicine

## 2024-09-13 ENCOUNTER — Encounter: Payer: Self-pay | Admitting: Family Medicine

## 2024-09-13 ENCOUNTER — Ambulatory Visit: Admitting: Family Medicine

## 2024-09-13 VITALS — BP 122/60 | HR 65 | Temp 97.7°F | Ht 65.25 in | Wt 163.8 lb

## 2024-09-13 DIAGNOSIS — Z0000 Encounter for general adult medical examination without abnormal findings: Secondary | ICD-10-CM | POA: Diagnosis not present

## 2024-09-13 DIAGNOSIS — E785 Hyperlipidemia, unspecified: Secondary | ICD-10-CM | POA: Diagnosis not present

## 2024-09-13 DIAGNOSIS — M8580 Other specified disorders of bone density and structure, unspecified site: Secondary | ICD-10-CM

## 2024-09-13 DIAGNOSIS — Z23 Encounter for immunization: Secondary | ICD-10-CM

## 2024-09-13 LAB — CBC WITH DIFFERENTIAL/PLATELET
Basophils Absolute: 0 K/uL (ref 0.0–0.1)
Basophils Relative: 0.4 % (ref 0.0–3.0)
Eosinophils Absolute: 0 K/uL (ref 0.0–0.7)
Eosinophils Relative: 1.4 % (ref 0.0–5.0)
HCT: 41.2 % (ref 36.0–46.0)
Hemoglobin: 13.9 g/dL (ref 12.0–15.0)
Lymphocytes Relative: 35.5 % (ref 12.0–46.0)
Lymphs Abs: 1.1 K/uL (ref 0.7–4.0)
MCHC: 33.9 g/dL (ref 30.0–36.0)
MCV: 84.1 fl (ref 78.0–100.0)
Monocytes Absolute: 0.3 K/uL (ref 0.1–1.0)
Monocytes Relative: 10.4 % (ref 3.0–12.0)
Neutro Abs: 1.6 K/uL (ref 1.4–7.7)
Neutrophils Relative %: 52.3 % (ref 43.0–77.0)
Platelets: 149 K/uL — ABNORMAL LOW (ref 150.0–400.0)
RBC: 4.9 Mil/uL (ref 3.87–5.11)
RDW: 15.5 % (ref 11.5–15.5)
WBC: 3.1 K/uL — ABNORMAL LOW (ref 4.0–10.5)

## 2024-09-13 LAB — HEPATIC FUNCTION PANEL
ALT: 14 U/L (ref 0–35)
AST: 19 U/L (ref 0–37)
Albumin: 4.4 g/dL (ref 3.5–5.2)
Alkaline Phosphatase: 53 U/L (ref 39–117)
Bilirubin, Direct: 0.2 mg/dL (ref 0.0–0.3)
Total Bilirubin: 1.6 mg/dL — ABNORMAL HIGH (ref 0.2–1.2)
Total Protein: 7.1 g/dL (ref 6.0–8.3)

## 2024-09-13 LAB — LIPID PANEL
Cholesterol: 198 mg/dL (ref 0–200)
HDL: 106.3 mg/dL (ref >39.00)
LDL Cholesterol: 84 mg/dL (ref 0–99)
NonHDL: 91.74
Total CHOL/HDL Ratio: 2
Triglycerides: 41 mg/dL (ref 0.0–149.0)
VLDL: 8.2 mg/dL (ref 0.0–40.0)

## 2024-09-13 LAB — BASIC METABOLIC PANEL WITH GFR
BUN: 13 mg/dL (ref 6–23)
CO2: 27 meq/L (ref 19–32)
Calcium: 9.4 mg/dL (ref 8.4–10.5)
Chloride: 107 meq/L (ref 96–112)
Creatinine, Ser: 0.97 mg/dL (ref 0.40–1.20)
GFR: 62.28 mL/min (ref >60.00)
Glucose, Bld: 74 mg/dL (ref 70–99)
Potassium: 3.7 meq/L (ref 3.5–5.1)
Sodium: 142 meq/L (ref 135–145)

## 2024-09-13 LAB — VITAMIN D 25 HYDROXY (VIT D DEFICIENCY, FRACTURES): VITD: 86.7 ng/mL (ref 30.00–100.00)

## 2024-09-13 LAB — TSH: TSH: 1.17 u[IU]/mL (ref 0.35–5.50)

## 2024-09-13 NOTE — Patient Instructions (Addendum)
Follow up in 6 months to recheck cholesterol We'll notify you of your lab results and make any changes if needed Continue to work on healthy diet and regular exercise- you're doing great! Call with any questions or concerns Stay Safe!  Stay Healthy! Happy Holidays!!! 

## 2024-09-13 NOTE — Assessment & Plan Note (Signed)
 Pt's PE WNL.  UTD on pap, mammo, colonoscopy.  Tdap updated today.  Check labs.  Anticipatory guidance provided.

## 2024-09-13 NOTE — Progress Notes (Signed)
° °  Subjective:    Patient ID: Courtney Calhoun, female    DOB: 10/29/1960, 63 y.o.   MRN: [id number]  HPI CPE- UTD on mammo, pap, colonoscopy.  Health Maintenance  Topic Date Due   HIV Screening  Never done   Hepatitis C Screening  Never done   Pneumococcal Vaccine: 50+ Years (1 of 1 - PCV) Never done   DTaP/Tdap/Td (2 - Td or Tdap) 05/03/2022   Influenza Vaccine  05/03/2024   COVID-19 Vaccine (9 - 2025-26 season) 06/03/2024   Mammogram  08/02/2025   Colonoscopy  02/10/2026   Cervical Cancer Screening (HPV/Pap Cotest)  07/12/2028   Zoster Vaccines- Shingrix   Completed   Hepatitis B Vaccines 19-59 Average Risk  Aged Out   HPV VACCINES  Aged Out   Meningococcal B Vaccine  Aged Out    Patient Care Team    Relationship Specialty Notifications Start End  Mahlon Comer BRAVO, MD PCP - General Family Medicine  05/23/11   Nicholaus Willma CROME, DO Referring Physician Osteopathic Medicine  09/29/15   Darina Barrio, MD Consulting Physician Gynecology  09/30/15       Review of Systems Patient reports no vision/ hearing changes, adenopathy,fever, weight change,  persistant/recurrent hoarseness , swallowing issues, chest pain, palpitations, edema, persistant/recurrent cough, hemoptysis, dyspnea (rest/exertional/paroxysmal nocturnal), gastrointestinal bleeding (melena, rectal bleeding), abdominal pain, significant heartburn, bowel changes, GU symptoms (dysuria, hematuria, incontinence), Gyn symptoms (abnormal  bleeding, pain),  syncope, focal weakness, memory loss, numbness & tingling, skin/hair/nail changes, abnormal bruising or bleeding, anxiety, or depression.     Objective:   Physical Exam General Appearance:    Alert, cooperative, no distress, appears stated age  Head:    Normocephalic, without obvious abnormality, atraumatic  Eyes:    PERRL, conjunctiva/corneas clear, EOM's intact both eyes  Ears:    Normal TM's and external ear canals, both ears  Nose:   Nares normal, septum midline, mucosa  normal, no drainage    or sinus tenderness  Throat:   Lips, mucosa, and tongue normal; teeth and gums normal  Neck:   Supple, symmetrical, trachea midline, no adenopathy;    Thyroid : no enlargement/tenderness/nodules  Back:     Symmetric, no curvature, ROM normal, no CVA tenderness  Lungs:     Clear to auscultation bilaterally, respirations unlabored  Chest Wall:    No tenderness or deformity   Heart:    Regular rate and rhythm, S1 and S2 normal, no murmur, rub   or gallop  Breast Exam:    Deferred to GYN  Abdomen:     Soft, non-tender, bowel sounds active all four quadrants,    no masses, no organomegaly  Genitalia:    Deferred to GYN  Rectal:    Extremities:   Extremities normal, atraumatic, no cyanosis or edema  Pulses:   2+ and symmetric all extremities  Skin:   Skin color, texture, turgor normal, no rashes or lesions  Lymph nodes:   Cervical, supraclavicular, and axillary nodes normal  Neurologic:   CNII-XII intact, normal strength, sensation and reflexes    throughout          Assessment & Plan:

## 2024-09-16 ENCOUNTER — Ambulatory Visit: Payer: Self-pay | Admitting: Family Medicine

## 2025-03-14 ENCOUNTER — Ambulatory Visit: Admitting: Family Medicine
